# Patient Record
Sex: Female | Born: 1980 | Race: White | Hispanic: No | Marital: Married | Smoking: Former smoker
Health system: Southern US, Community
[De-identification: ages and names within clinical notes are randomized; demographics above are authoritative.]

## PROBLEM LIST (undated history)

## (undated) ENCOUNTER — Inpatient Hospital Stay (HOSPITAL_COMMUNITY): Payer: Self-pay

## (undated) DIAGNOSIS — T8339XA Other mechanical complication of intrauterine contraceptive device, initial encounter: Secondary | ICD-10-CM

## (undated) DIAGNOSIS — K219 Gastro-esophageal reflux disease without esophagitis: Secondary | ICD-10-CM

## (undated) DIAGNOSIS — E119 Type 2 diabetes mellitus without complications: Secondary | ICD-10-CM

## (undated) DIAGNOSIS — A599 Trichomoniasis, unspecified: Secondary | ICD-10-CM

## (undated) DIAGNOSIS — Z8619 Personal history of other infectious and parasitic diseases: Secondary | ICD-10-CM

## (undated) DIAGNOSIS — O10919 Unspecified pre-existing hypertension complicating pregnancy, unspecified trimester: Secondary | ICD-10-CM

## (undated) DIAGNOSIS — D6861 Antiphospholipid syndrome: Secondary | ICD-10-CM

## (undated) DIAGNOSIS — E282 Polycystic ovarian syndrome: Secondary | ICD-10-CM

## (undated) DIAGNOSIS — I10 Essential (primary) hypertension: Secondary | ICD-10-CM

## (undated) DIAGNOSIS — E039 Hypothyroidism, unspecified: Secondary | ICD-10-CM

## (undated) DIAGNOSIS — L409 Psoriasis, unspecified: Secondary | ICD-10-CM

## (undated) HISTORY — DX: Unspecified pre-existing hypertension complicating pregnancy, unspecified trimester: O10.919

## (undated) HISTORY — DX: Hypothyroidism, unspecified: E03.9

## (undated) HISTORY — DX: Type 2 diabetes mellitus without complications: E11.9

## (undated) HISTORY — DX: Trichomoniasis, unspecified: A59.9

---
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
## 2013-10-26 DIAGNOSIS — E282 Polycystic ovarian syndrome: Secondary | ICD-10-CM

## 2013-10-26 HISTORY — DX: Polycystic ovarian syndrome: E28.2

## 2014-04-03 ENCOUNTER — Encounter (HOSPITAL_COMMUNITY): Payer: Self-pay | Admitting: *Deleted

## 2014-04-03 NOTE — H&P (Signed)
Kelli Miller is an 33 y.o. female with missed ab at 7 weeks.  Patient elects to proceed with surgical management.  Blood type AB positive.  Pertinent Gynecological History: Menses: pregnant Bleeding: pregnant Contraception: none DES exposure: unknown Blood transfusions: none Sexually transmitted diseases: no past history Previous GYN Procedures: none  Last mammogram: n/a Date: n/a Last pap: normal Date: 2014 OB History: G3, P0   Menstrual History: Menarche age: n/a No LMP recorded. Patient is pregnant.    Past Medical History  Diagnosis Date  . Hypertension     no meds- ? white coat syndrome    Past Surgical History  Procedure Laterality Date  . No past surgeries      History reviewed. No pertinent family history.  Social History:  reports that she quit smoking about 5 months ago. She does not have any smokeless tobacco history on file. She reports that she does not drink alcohol or use illicit drugs.  Allergies: Allergies not on file  No prescriptions prior to admission    Review of Systems  Constitutional: Negative for fever and chills.  Cardiovascular: Negative for chest pain.  Gastrointestinal: Negative for nausea, vomiting and abdominal pain.  Genitourinary: Negative for dysuria.  Neurological: Negative for headaches.    There were no vitals taken for this visit. Physical Exam  Constitutional: She is oriented to person, place, and time. She appears well-developed and well-nourished.  GI: Soft. There is no tenderness. There is no rebound and no guarding.  Neurological: She is alert and oriented to person, place, and time.  Skin: Skin is warm and dry.  Psychiatric: She has a normal mood and affect. Her behavior is normal.    No results found for this or any previous visit (from the past 24 hour(s)).  No results found.  Assessment/Plan: 32yo G3P0 with missed ab at 7 weeks -Suction d&c possibly with genetics  Mitchel Honour 04/03/2014, 8:18 PM

## 2014-04-04 ENCOUNTER — Encounter (HOSPITAL_COMMUNITY): Payer: Self-pay | Admitting: Pharmacy Technician

## 2014-04-04 MED ORDER — DOXYCYCLINE HYCLATE 100 MG IV SOLR
100.0000 mg | Freq: Once | INTRAVENOUS | Status: AC
  Administered 2014-04-05: 250 mg via INTRAVENOUS
  Filled 2014-04-04: qty 100

## 2014-04-05 ENCOUNTER — Ambulatory Visit (HOSPITAL_COMMUNITY): Payer: PRIVATE HEALTH INSURANCE

## 2014-04-05 ENCOUNTER — Encounter (HOSPITAL_COMMUNITY): Payer: PRIVATE HEALTH INSURANCE | Admitting: Anesthesiology

## 2014-04-05 ENCOUNTER — Ambulatory Visit (HOSPITAL_COMMUNITY)
Admission: RE | Admit: 2014-04-05 | Discharge: 2014-04-05 | Disposition: A | Discharge status: Home / Self Care | Payer: PRIVATE HEALTH INSURANCE | Source: Ambulatory Visit | Attending: Obstetrics & Gynecology | Admitting: Obstetrics & Gynecology

## 2014-04-05 ENCOUNTER — Ambulatory Visit (HOSPITAL_COMMUNITY): Payer: PRIVATE HEALTH INSURANCE | Admitting: Anesthesiology

## 2014-04-05 ENCOUNTER — Encounter (HOSPITAL_COMMUNITY)
Admission: RE | Disposition: A | Discharge status: Home / Self Care | Payer: Self-pay | Source: Ambulatory Visit | Attending: Obstetrics & Gynecology

## 2014-04-05 ENCOUNTER — Encounter (HOSPITAL_COMMUNITY): Payer: Self-pay | Admitting: Anesthesiology

## 2014-04-05 DIAGNOSIS — Z6841 Body Mass Index (BMI) 40.0 and over, adult: Secondary | ICD-10-CM | POA: Insufficient documentation

## 2014-04-05 DIAGNOSIS — E282 Polycystic ovarian syndrome: Secondary | ICD-10-CM | POA: Insufficient documentation

## 2014-04-05 DIAGNOSIS — I10 Essential (primary) hypertension: Secondary | ICD-10-CM | POA: Insufficient documentation

## 2014-04-05 DIAGNOSIS — Z87891 Personal history of nicotine dependence: Secondary | ICD-10-CM | POA: Insufficient documentation

## 2014-04-05 DIAGNOSIS — O021 Missed abortion: Secondary | ICD-10-CM | POA: Insufficient documentation

## 2014-04-05 HISTORY — DX: Essential (primary) hypertension: I10

## 2014-04-05 HISTORY — PX: DILATION AND EVACUATION: SHX1459

## 2014-04-05 LAB — CBC
HEMATOCRIT: 39.8 % (ref 36.0–46.0)
HEMOGLOBIN: 13.6 g/dL (ref 12.0–15.0)
MCH: 29.8 pg (ref 26.0–34.0)
MCHC: 34.2 g/dL (ref 30.0–36.0)
MCV: 87.1 fL (ref 78.0–100.0)
Platelets: 290 10*3/uL (ref 150–400)
RBC: 4.57 MIL/uL (ref 3.87–5.11)
RDW: 15.1 % (ref 11.5–15.5)
WBC: 10.6 10*3/uL — ABNORMAL HIGH (ref 4.0–10.5)

## 2014-04-05 IMAGING — US US OB TRANSVAGINAL
1 series · 14 of 27 positions shown · non-contrast
Comparison: None.

CLINICAL DATA: Confirm intrauterine fetal demise prior to D and C.

EXAM:
OBSTETRIC <14 WK US AND TRANSVAGINAL OB US
TECHNIQUE: Both transabdominal and transvaginal ultrasound examinations were
performed for complete evaluation of the gestation as well as the
maternal uterus, adnexal regions, and pelvic cul-de-sac.
Transvaginal technique was performed to assess early pregnancy.

[Series 1: us ob comp less 14 wks · 27 acquisitions, 14 frames shown]
[im 1/27]
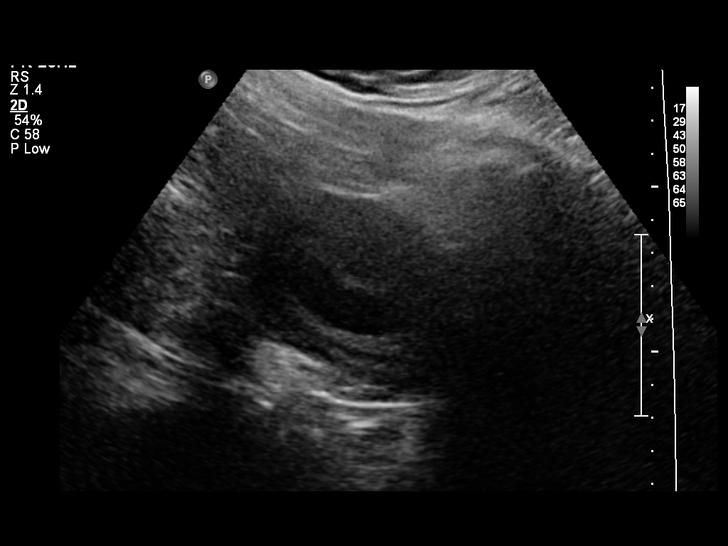
[im 3/27]
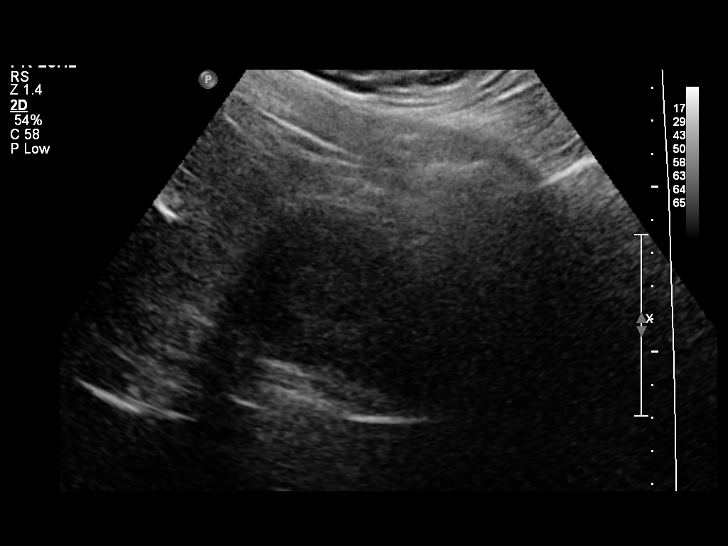
[im 5/27]
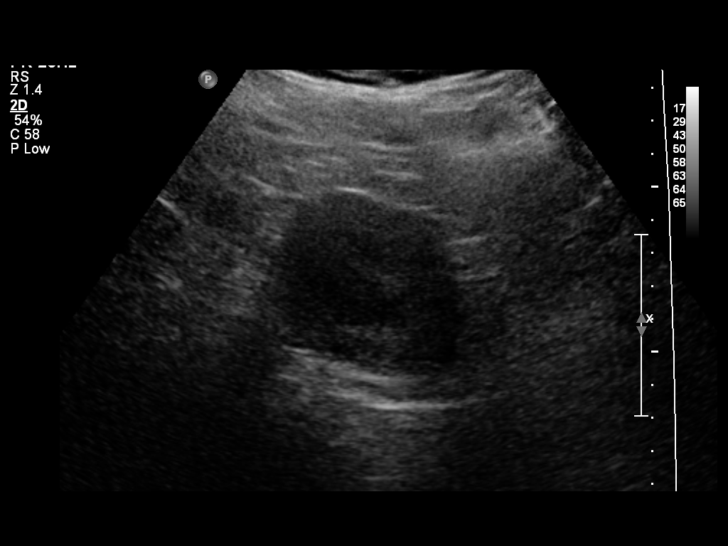
[im 7/27]
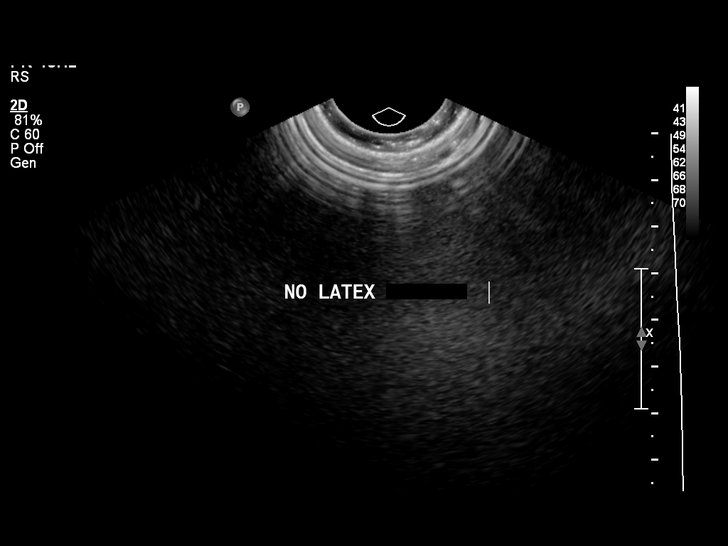
[im 9/27]
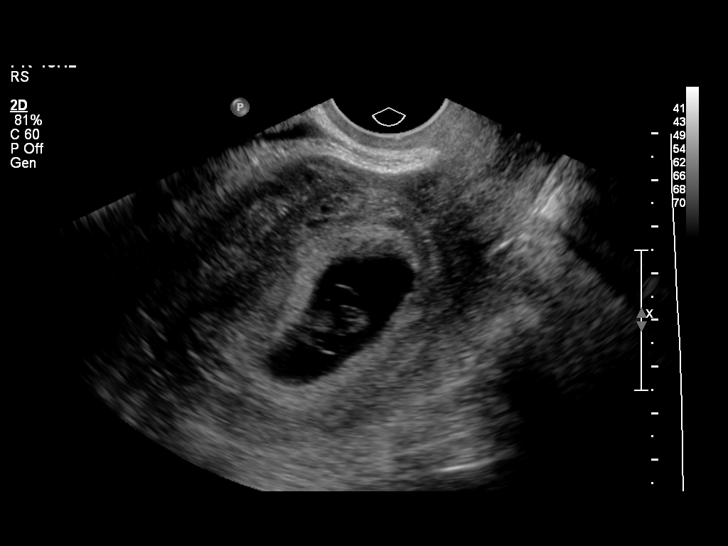
[im 11/27]
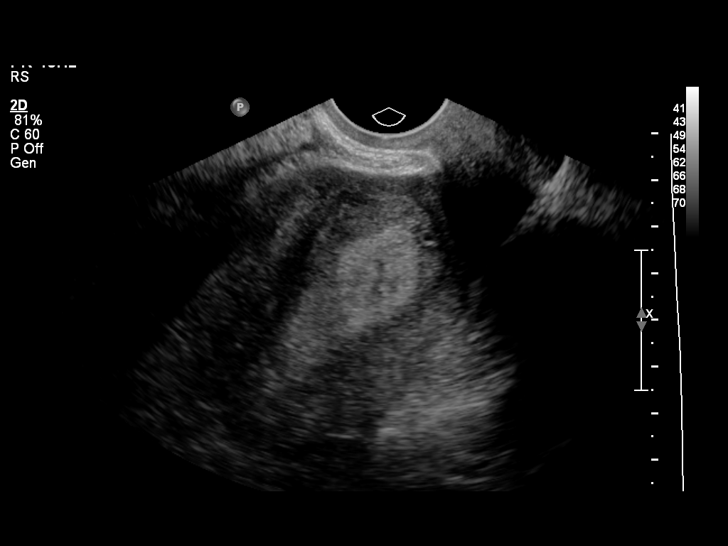
[im 13/27]
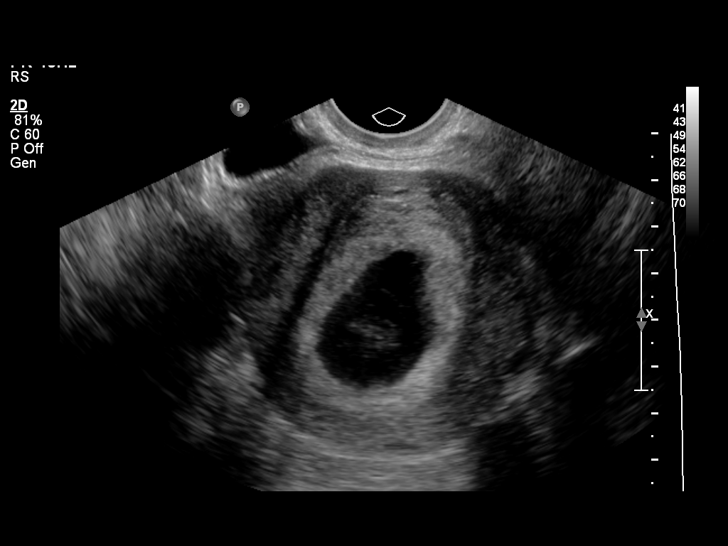
[im 15/27]
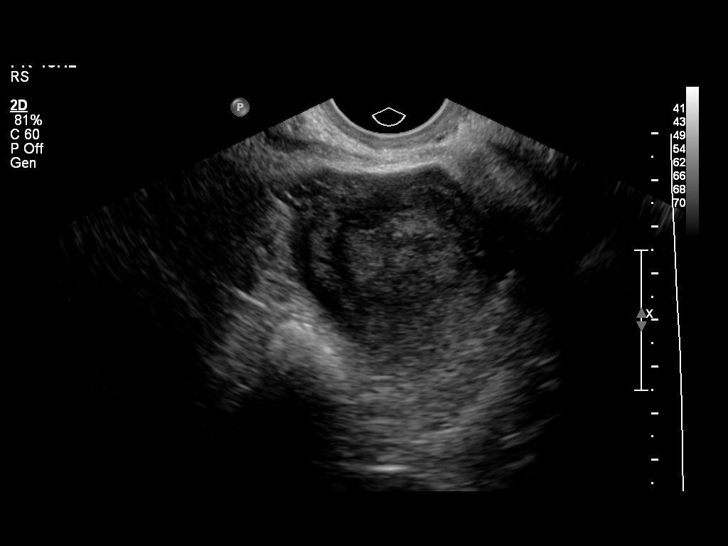
[im 17/27]
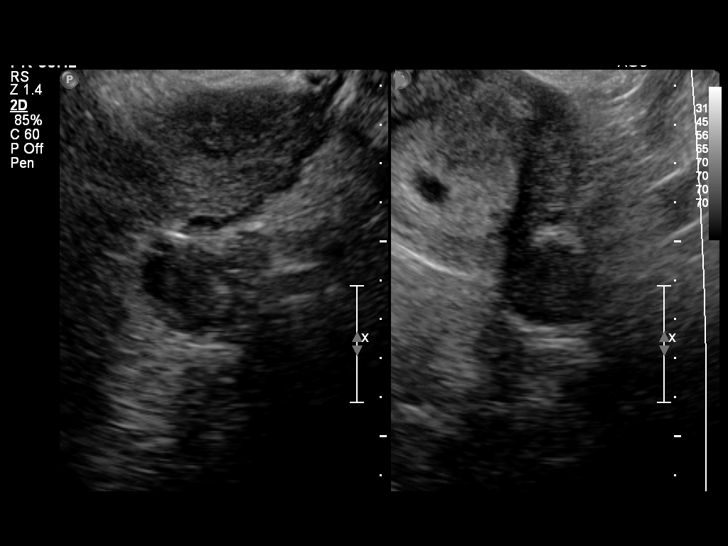
[im 19/27]
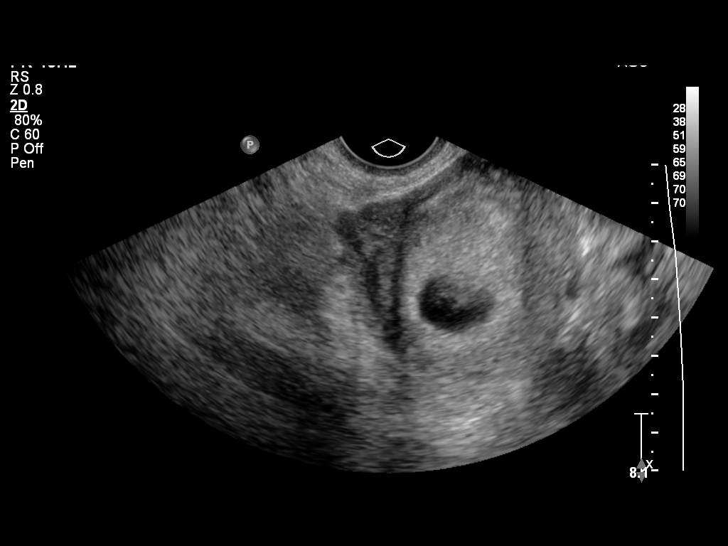
[im 21/27]
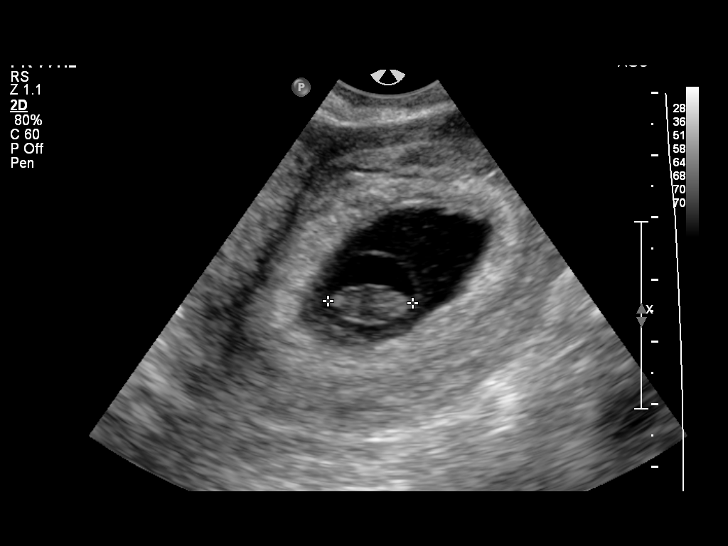
[im 23/27]
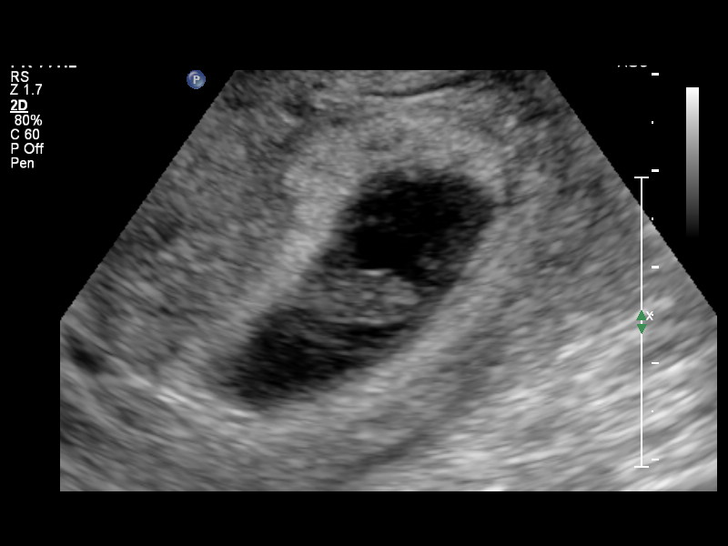
[im 25/27]
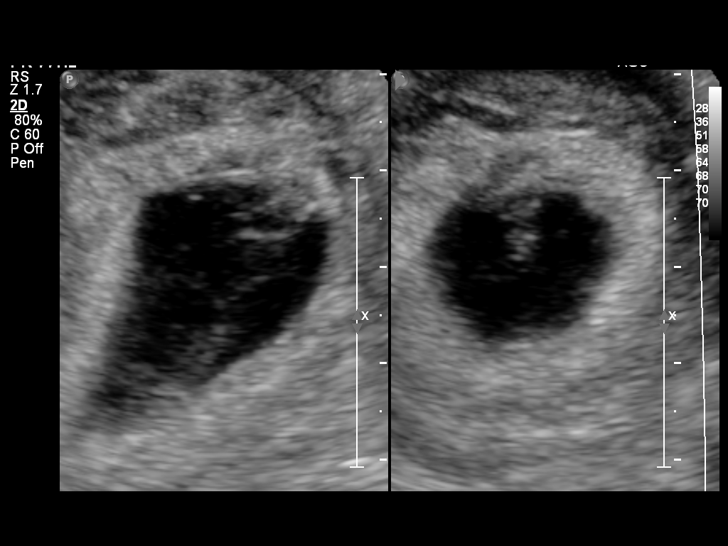
[im 27/27]
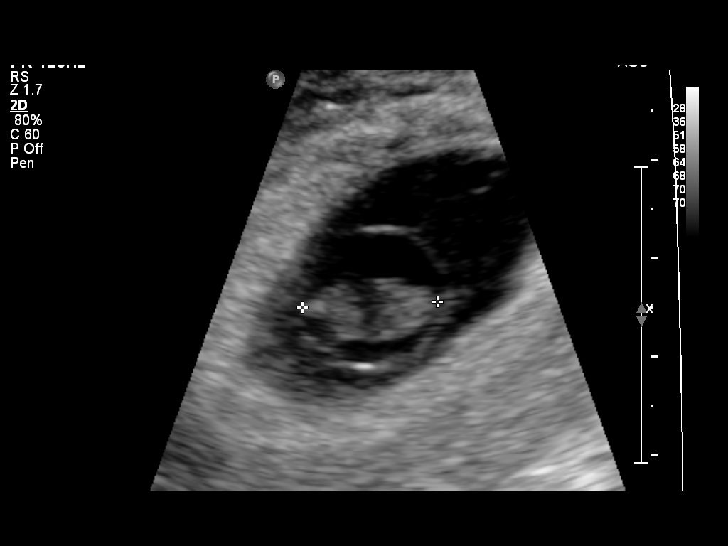

[14 of 27 positions shown; findings below may reference images not displayed]

FINDINGS: Intrauterine gestational sac: Visualized/normal in shape.

Yolk sac:  Yes

Embryo:  Visualize, somewhat an amorphous in appearance.

Cardiac Activity: No Cardiac activity detected

Heart Rate:  No fetal heart rate detectable

CRL:   13.7  mm   7 w 5 d                  US EDC: [DATE]

Maternal uterus/adnexae: No uterine masses. Normal left ovary. Right
ovary not visualized. No adnexal masses. No free fluid. Cervix is
closed.
IMPRESSION: 1. Sonographic findings confirmed intrauterine fetal demise. Embryo
has a crown-rump length consistent with 7 week 5 day gestation.
Findings meet definitive criteria for failed pregnancy. This follows
SRU consensus guidelines: Diagnostic Criteria for Nonviable
Pregnancy Early in the First Trimester. N Engl J Med

## 2014-04-05 SURGERY — DILATION AND EVACUATION, UTERUS
Anesthesia: Monitor Anesthesia Care | Site: Uterus

## 2014-04-05 MED ORDER — MIDAZOLAM HCL 2 MG/2ML IJ SOLN
INTRAMUSCULAR | Status: AC
  Filled 2014-04-05: qty 2

## 2014-04-05 MED ORDER — METOCLOPRAMIDE HCL 5 MG/ML IJ SOLN
10.0000 mg | Freq: Once | INTRAMUSCULAR | Status: DC | PRN
Start: 2014-04-05 — End: 2014-04-05

## 2014-04-05 MED ORDER — OXYCODONE-ACETAMINOPHEN 7.5-325 MG PO TABS: 1.0000 | ORAL_TABLET | ORAL | Status: DC | PRN

## 2014-04-05 MED ORDER — MIDAZOLAM HCL 2 MG/2ML IJ SOLN
INTRAMUSCULAR | Status: DC | PRN
  Administered 2014-04-05: 2 mg via INTRAVENOUS

## 2014-04-05 MED ORDER — LACTATED RINGERS IV SOLN
INTRAVENOUS | Status: DC
  Administered 2014-04-05: 13:00:00 via INTRAVENOUS

## 2014-04-05 MED ORDER — PANTOPRAZOLE SODIUM 20 MG PO TBEC
20.0000 mg | DELAYED_RELEASE_TABLET | Freq: Every day | ORAL | Status: DC
  Administered 2014-04-05: 40 mg via ORAL

## 2014-04-05 MED ORDER — IBUPROFEN 200 MG PO TABS: 200.0000 mg | ORAL_TABLET | Freq: Four times a day (QID) | ORAL | Status: DC | PRN

## 2014-04-05 MED ORDER — PANTOPRAZOLE SODIUM 40 MG PO TBEC
DELAYED_RELEASE_TABLET | ORAL | Status: AC
  Administered 2014-04-05: 40 mg via ORAL
  Filled 2014-04-05: qty 1

## 2014-04-05 MED ORDER — MEPERIDINE HCL 25 MG/ML IJ SOLN: 6.2500 mg | INTRAMUSCULAR | Status: DC | PRN

## 2014-04-05 MED ORDER — DEXTROSE IN LACTATED RINGERS 5 % IV SOLN: INTRAVENOUS | Status: DC

## 2014-04-05 MED ORDER — FENTANYL CITRATE 0.05 MG/ML IJ SOLN
25.0000 ug | INTRAMUSCULAR | Status: DC | PRN
  Administered 2014-04-05: 50 ug via INTRAVENOUS

## 2014-04-05 MED ORDER — CHLOROPROCAINE HCL 1 % IJ SOLN
INTRAMUSCULAR | Status: AC
  Filled 2014-04-05: qty 30

## 2014-04-05 MED ORDER — FENTANYL CITRATE 0.05 MG/ML IJ SOLN
INTRAMUSCULAR | Status: AC
  Filled 2014-04-05: qty 2

## 2014-04-05 MED ORDER — DEXAMETHASONE SODIUM PHOSPHATE 10 MG/ML IJ SOLN
INTRAMUSCULAR | Status: AC
  Filled 2014-04-05: qty 1

## 2014-04-05 MED ORDER — LIDOCAINE HCL (CARDIAC) 20 MG/ML IV SOLN
INTRAVENOUS | Status: DC | PRN
  Administered 2014-04-05: 40 mg via INTRAVENOUS

## 2014-04-05 MED ORDER — LIDOCAINE HCL (CARDIAC) 20 MG/ML IV SOLN
INTRAVENOUS | Status: AC
  Filled 2014-04-05: qty 5

## 2014-04-05 MED ORDER — KETOROLAC TROMETHAMINE 30 MG/ML IJ SOLN
INTRAMUSCULAR | Status: DC | PRN
  Administered 2014-04-05: 30 mg via INTRAVENOUS

## 2014-04-05 MED ORDER — PROPOFOL 10 MG/ML IV EMUL
INTRAVENOUS | Status: DC | PRN
  Administered 2014-04-05: 10 mg via INTRAVENOUS
  Administered 2014-04-05: 50 mg via INTRAVENOUS
  Administered 2014-04-05 (×2): 10 mg via INTRAVENOUS

## 2014-04-05 MED ORDER — ONDANSETRON HCL 4 MG/2ML IJ SOLN
INTRAMUSCULAR | Status: DC | PRN
  Administered 2014-04-05: 4 mg via INTRAVENOUS

## 2014-04-05 MED ORDER — FENTANYL CITRATE 0.05 MG/ML IJ SOLN
INTRAMUSCULAR | Status: DC | PRN
  Administered 2014-04-05 (×2): 100 ug via INTRAVENOUS

## 2014-04-05 MED ORDER — CHLOROPROCAINE HCL 1 % IJ SOLN
INTRAMUSCULAR | Status: DC | PRN
  Administered 2014-04-05: 8 mL

## 2014-04-05 MED ORDER — KETOROLAC TROMETHAMINE 30 MG/ML IJ SOLN
INTRAMUSCULAR | Status: AC
  Filled 2014-04-05: qty 1

## 2014-04-05 MED ORDER — PROPOFOL 10 MG/ML IV EMUL
INTRAVENOUS | Status: AC
  Filled 2014-04-05: qty 20

## 2014-04-05 MED ORDER — ONDANSETRON HCL 4 MG/2ML IJ SOLN
INTRAMUSCULAR | Status: AC
  Filled 2014-04-05: qty 2

## 2014-04-05 SURGICAL SUPPLY — 20 items
CATH ROBINSON RED A/P 16FR (CATHETERS) ×2 IMPLANT
CLOTH BEACON ORANGE TIMEOUT ST (SAFETY) ×2 IMPLANT
DECANTER SPIKE VIAL GLASS SM (MISCELLANEOUS) ×2 IMPLANT
GLOVE BIO SURGEON STRL SZ 6 (GLOVE) ×2 IMPLANT
GLOVE BIOGEL PI IND STRL 6 (GLOVE) ×2 IMPLANT
GLOVE BIOGEL PI INDICATOR 6 (GLOVE) ×2
GOWN STRL REUS W/TWL LRG LVL3 (GOWN DISPOSABLE) ×4 IMPLANT
KIT BERKELEY 1ST TRIMESTER 3/8 (MISCELLANEOUS) ×2 IMPLANT
NEEDLE SPNL 22GX3.5 QUINCKE BK (NEEDLE) ×2 IMPLANT
NS IRRIG 1000ML POUR BTL (IV SOLUTION) ×2 IMPLANT
PACK VAGINAL MINOR WOMEN LF (CUSTOM PROCEDURE TRAY) ×2 IMPLANT
PAD OB MATERNITY 4.3X12.25 (PERSONAL CARE ITEMS) ×2 IMPLANT
PAD PREP 24X48 CUFFED NSTRL (MISCELLANEOUS) ×2 IMPLANT
SET BERKELEY SUCTION TUBING (SUCTIONS) ×2 IMPLANT
SYR CONTROL 10ML LL (SYRINGE) ×2 IMPLANT
TOWEL OR 17X24 6PK STRL BLUE (TOWEL DISPOSABLE) ×4 IMPLANT
VACURETTE 10 RIGID CVD (CANNULA) IMPLANT
VACURETTE 7MM CVD STRL WRAP (CANNULA) IMPLANT
VACURETTE 8 RIGID CVD (CANNULA) ×2 IMPLANT
VACURETTE 9 RIGID CVD (CANNULA) IMPLANT

## 2014-04-05 NOTE — Anesthesia Postprocedure Evaluation (Signed)
  Anesthesia Post-op Note  Patient: Kelli Miller  Procedure(s) Performed: Procedure(s): DILATATION AND EVACUATION with genetic studies (N/A) Patient is awake and responsive. Pain and nausea are reasonably well controlled. Vital signs are stable and clinically acceptable. Oxygen saturation is clinically acceptable. There are no apparent anesthetic complications at this time. Patient is ready for discharge.

## 2014-04-05 NOTE — Transfer of Care (Signed)
Immediate Anesthesia Transfer of Care Note  Patient: Kelli Miller  Procedure(s) Performed: Procedure(s): DILATATION AND EVACUATION with genetic studies (N/A)  Patient Location: PACU  Anesthesia Type:MAC  Level of Consciousness: awake, alert , oriented and patient cooperative  Airway & Oxygen Therapy: Patient Spontanous Breathing  Post-op Assessment: Report given to PACU RN and Post -op Vital signs reviewed and stable  Post vital signs: Reviewed and stable  Complications: No apparent anesthesia complications

## 2014-04-05 NOTE — Anesthesia Preprocedure Evaluation (Addendum)
Anesthesia Evaluation  Patient identified by MRN, date of birth, ID band Patient awake    Reviewed: Allergy & Precautions, H&P , NPO status , Patient's Chart, lab work & pertinent test results  Airway Mallampati: III TM Distance: >3 FB Neck ROM: Full    Dental no notable dental hx. (+) Teeth Intact   Pulmonary former smoker,  breath sounds clear to auscultation  Pulmonary exam normal       Cardiovascular hypertension, Rhythm:Regular Rate:Normal     Neuro/Psych negative neurological ROS  negative psych ROS   GI/Hepatic negative GI ROS, Neg liver ROS,   Endo/Other  Morbid obesityObesity PCOS  Renal/GU   negative genitourinary   Musculoskeletal negative musculoskeletal ROS (+)   Abdominal   Peds  Hematology negative hematology ROS (+)   Anesthesia Other Findings   Reproductive/Obstetrics Missed Ab                         Anesthesia Physical Anesthesia Plan  ASA: III  Anesthesia Plan: MAC   Post-op Pain Management:    Induction: Intravenous  Airway Management Planned: Natural Airway and Nasal Cannula  Additional Equipment:   Intra-op Plan:   Post-operative Plan:   Informed Consent: I have reviewed the patients History and Physical, chart, labs and discussed the procedure including the risks, benefits and alternatives for the proposed anesthesia with the patient or authorized representative who has indicated his/her understanding and acceptance.     Plan Discussed with: Anesthesiologist, CRNA and Surgeon  Anesthesia Plan Comments:        Anesthesia Quick Evaluation

## 2014-04-05 NOTE — Discharge Instructions (Signed)
NO IBUPROFEN (ADVIL, ALEVE OR MOTRIN) TILL AFTER 7:45 PM!    Dilation and Curettage or Vacuum Curettage, Care After Refer to this sheet in the next few weeks. These instructions provide you with information on caring for yourself after your procedure. Your health care provider may also give you more specific instructions. Your treatment has been planned according to current medical practices, but problems sometimes occur. Call your health care provider if you have any problems or questions after your procedure.  WHAT TO EXPECT AFTER THE PROCEDURE After your procedure, it is typical to have light cramping and bleeding. This may last for 2 days to 2 weeks after the procedure.  HOME CARE INSTRUCTIONS   Call MD for T>100.4, severe abdominal pain, intractable nausea and/or vomiting, or heavy vaginal bleeding.    Call office to schedule postop appt in 2 weeks.    No driving while taking narcotics.    Pelvic rest x 4 weeks.    Do not drive for 24 hours.  Wait 1 week before returning to strenuous activities.  Take your temperature 2 times a day for 4 days and write it down. Provide these temperatures to your health care provider if you develop a fever.  Avoid long periods of standing.  Avoid heavy lifting, pushing, or pulling. Do not lift anything heavier than 10 pounds (4.5 kg).  Limit stair climbing to once or twice a day.  Take rest periods often.  You may resume your usual diet.  Drink enough fluids to keep your urine clear or pale yellow.  Your usual bowel function should return. If you have constipation, you may:  Take a mild laxative with permission from your health care provider.  Add fruit and bran to your diet.  Drink more fluids.  Take showers instead of baths until your health care provider gives you permission to take baths.  Do not go swimming or use a hot tub until your health care provider approves.  Try to have someone with you or available to you the first  24 48 hours, especially if you were given a general anesthetic.  Do not douche, use tampons, or have intercourse for 2 weeks after the procedure.  Only take over-the-counter or prescription medicines as directed by your health care provider. Do not take aspirin. It can cause bleeding.  Follow up with your health care provider as directed.  SEEK MEDICAL CARE IF:   You have increasing cramps or pain that is not relieved with medicine.  You have abdominal pain that does not seem to be related to the same area of earlier cramping and pain.  You have bad smelling vaginal discharge.  You have a rash.  You are having problems with any medicine. SEEK IMMEDIATE MEDICAL CARE IF:   You have bleeding that is heavier than a normal menstrual period.  You have a fever.  You have chest pain.  You have shortness of breath.  You feel dizzy or feel like fainting.  You pass out.  You have pain in your shoulder strap area.  You have heavy vaginal bleeding with or without blood clots.  Document Released: 10/09/2000 Document Revised: 08/02/2013 Document Reviewed: 05/11/2013

## 2014-04-05 NOTE — Progress Notes (Signed)
No change to H&P. 

## 2014-04-06 ENCOUNTER — Encounter (HOSPITAL_COMMUNITY): Payer: Self-pay | Admitting: Obstetrics & Gynecology

## 2014-04-06 NOTE — Op Note (Signed)
Patient: Kelli Miller, Kelli Miller DOB: 07/01/1981  PREOPERATIVE DIAGNOSIS: Missed abortion at 7 weeks  POSTOPERATIVE DIAGNOSIS: The same  PROCEDURE: Suction Dilation and Curettage with genetic studies.  SURGEON: Dr. Mitchel HonourMegan Darilyn Storbeck  INDICATIONS: 33 y.o. G3P0 here for scheduled surgery for suction D&C for missed abortion at 7 weeks. Risks of surgery were discussed with the patient including but not limited to: bleeding which may require transfusion; infection which may require antibiotics; injury to uterus or surrounding organs; intrauterine scarring which may impair future fertility; need for additional procedures including laparotomy or laparoscopy; and other postoperative/anesthesia complications. Written informed consent was obtained.  FINDINGS: A 8 week size uterus.  ANESTHESIA: conscious sedation and local  ESTIMATED BLOOD LOSS: 20cc  SPECIMENS: Products of conception sent for genetic testing and pathology  COMPLICATIONS: None immediate.  PROCEDURE DETAILS: The patient received intravenous antibiotics while in the preoperative area. She was then taken to the operating room where conscious sedation was administered. After an adequate timeout was performed, she was placed in the dorsal lithotomy position and examined; then prepped and draped in the sterile manner. Her bladder was catheterized for an unmeasured amount of clear, yellow urine. A speculum was then placed in the patient's vagina and cervical block was performed using 10cc of 1% nesacaine. a single tooth tenaculum was applied to the anterior lip of the cervix. The uteus was dilated manually with Pratt cervical dilators up to 24 [place]. Once the cervix was dilated, 8 mm suction curette was advanced and suction attached. Three suction curettage passes were performed which did remove tissue. A sharp curettage was then performed to obtain a scant amount of endometrial curettings and revealed a gritty texture circumferentially. The tenaculum was removed from  the anterior lip of the cervix and the vaginal speculum was removed after noting good hemostasis. The patient tolerated the procedure well and was taken to the recovery area awake, extubated and in stable condition.

## 2014-04-26 ENCOUNTER — Ambulatory Visit (INDEPENDENT_AMBULATORY_CARE_PROVIDER_SITE_OTHER): Payer: PRIVATE HEALTH INSURANCE | Admitting: General Surgery

## 2014-04-30 LAB — CHROMOSOME STD, POC(TISSUE)-NCBH

## 2014-08-27 ENCOUNTER — Encounter (HOSPITAL_COMMUNITY): Payer: Self-pay | Admitting: Obstetrics & Gynecology

## 2016-01-16 DIAGNOSIS — N915 Oligomenorrhea, unspecified: Secondary | ICD-10-CM | POA: Insufficient documentation

## 2016-01-16 DIAGNOSIS — N96 Recurrent pregnancy loss: Secondary | ICD-10-CM | POA: Insufficient documentation

## 2016-01-16 DIAGNOSIS — R7989 Other specified abnormal findings of blood chemistry: Secondary | ICD-10-CM | POA: Insufficient documentation

## 2017-09-23 ENCOUNTER — Encounter: Payer: Self-pay | Admitting: Obstetrics and Gynecology

## 2017-09-23 ENCOUNTER — Ambulatory Visit (INDEPENDENT_AMBULATORY_CARE_PROVIDER_SITE_OTHER): Payer: PRIVATE HEALTH INSURANCE | Admitting: Obstetrics and Gynecology

## 2017-09-23 VITALS — BP 133/87 | HR 84 | Ht 61.0 in | Wt 212.0 lb

## 2017-09-23 DIAGNOSIS — Z124 Encounter for screening for malignant neoplasm of cervix: Secondary | ICD-10-CM

## 2017-09-23 DIAGNOSIS — Z01419 Encounter for gynecological examination (general) (routine) without abnormal findings: Secondary | ICD-10-CM

## 2017-09-23 DIAGNOSIS — Z1151 Encounter for screening for human papillomavirus (HPV): Secondary | ICD-10-CM

## 2017-09-23 MED ORDER — PROGESTERONE MICRONIZED 200 MG PO CAPS
ORAL_CAPSULE | ORAL | 3 refills | Status: DC
Start: 2017-09-23 — End: 2018-03-01

## 2017-09-23 NOTE — Progress Notes (Signed)
Subjective:     [name] is a 36 y.o. female P0 with BMI 40 whois here for a comprehensive physical exam. The patient reports no problems. She is sexually active seeking pregnancy. She desires to be seen by an infertility specialist for assistance. She reports monthly menses induced by progesterone. She denies pelvic pain or abnormal discharge.  Past Medical History:  Diagnosis Date  . Diabetes (HCC)   . Hypothyroidism    Past Surgical History:  Procedure Laterality Date  . DILATION AND EVACUATION N/A 04/05/2014   Procedure: DILATATION AND EVACUATION with genetic studies;  Surgeon: Mitchel HonourMegan Morris, DO;  Location: [facility];  Service: Gynecology;  Laterality: N/A;  . NO PAST SURGERIES     Family History  Problem Relation Age of Onset  . Lung cancer Father   . Lung cancer Mother     Social History   Socioeconomic History  . Marital status: Single    Spouse name: Not on file  . Number of children: Not on file  . Years of education: Not on file  . Highest education level: Not on file  Social Needs  . Financial resource strain: Not on file  . Food insecurity - worry: Not on file  . Food insecurity - inability: Not on file  . Transportation needs - medical: Not on file  . Transportation needs - non-medical: Not on file  Occupational History  . Not on file  Tobacco Use  . Smoking status: Former Smoker    Last attempt to quit: 10/03/2013    Years since quitting: 3.9  . Smokeless tobacco: Never Used  Substance and Sexual Activity  . Alcohol use: No  . Drug use: No  . Sexual activity: Yes    Birth control/protection: None  Other Topics Concern  . Not on file  Social History Narrative  . Not on file   Health Maintenance  Topic Date Due  . HIV Screening  04/30/1994  . TETANUS/TDAP  04/30/1998  . PAP SMEAR  04/30/2000  . INFLUENZA VACCINE  05/26/2017       Review of Systems Pertinent items are noted in HPI.   Objective:  Blood pressure 133/87, pulse 84, height 5\' 1"   (1.549 m), weight 212 lb (96.2 kg), last menstrual period 08/23/2017, unknown if currently breastfeeding.      GENERAL: Well-developed, well-nourished female in no acute distress.  HEENT: Normocephalic, atraumatic. Sclerae anicteric.  NECK: Supple. Normal thyroid.  LUNGS: Clear to auscultation bilaterally.  HEART: Regular rate and rhythm. BREASTS: Symmetric in size. No palpable masses or lymphadenopathy, skin changes, or nipple drainage. ABDOMEN: Soft, nontender, nondistended. No organomegaly. PELVIC: Normal external female genitalia. Vagina is pink and rugated.  Normal discharge. Normal appearing cervix. Uterus is normal in size. No adnexal mass or tenderness. EXTREMITIES: No cyanosis, clubbing, or edema, 2+ distal pulses.  Assessment:    Healthy female exam.      Plan:    Pap smear collected Patient advised to perform monthly self breast and vulva exam Patient will be contacted with any abnormal results Referral to meet Dr. April MansonYalcinkaya provided Patient reports normal A1C a few months ago RTC in 1 year or prn See After Visit Summary for Counseling Recommendations

## 2017-09-27 ENCOUNTER — Encounter: Payer: Self-pay | Admitting: Obstetrics and Gynecology

## 2017-09-28 LAB — CYTOLOGY - PAP
Bacterial vaginitis: NEGATIVE
CANDIDA VAGINITIS: NEGATIVE
DIAGNOSIS: NEGATIVE
HPV: NOT DETECTED

## 2017-10-26 DIAGNOSIS — Z8619 Personal history of other infectious and parasitic diseases: Secondary | ICD-10-CM

## 2017-10-26 DIAGNOSIS — D6861 Antiphospholipid syndrome: Secondary | ICD-10-CM

## 2017-10-26 HISTORY — DX: Antiphospholipid syndrome: D68.61

## 2017-10-26 HISTORY — DX: Personal history of other infectious and parasitic diseases: Z86.19

## 2017-12-16 ENCOUNTER — Ambulatory Visit (INDEPENDENT_AMBULATORY_CARE_PROVIDER_SITE_OTHER): Payer: PRIVATE HEALTH INSURANCE | Admitting: Obstetrics and Gynecology

## 2017-12-16 ENCOUNTER — Encounter: Payer: Self-pay | Admitting: Obstetrics and Gynecology

## 2017-12-16 VITALS — BP 152/103 | HR 101 | Wt 212.0 lb

## 2017-12-16 DIAGNOSIS — O24111 Pre-existing diabetes mellitus, type 2, in pregnancy, first trimester: Secondary | ICD-10-CM

## 2017-12-16 DIAGNOSIS — O0991 Supervision of high risk pregnancy, unspecified, first trimester: Secondary | ICD-10-CM

## 2017-12-16 DIAGNOSIS — O24119 Pre-existing diabetes mellitus, type 2, in pregnancy, unspecified trimester: Secondary | ICD-10-CM

## 2017-12-16 DIAGNOSIS — O99281 Endocrine, nutritional and metabolic diseases complicating pregnancy, first trimester: Secondary | ICD-10-CM

## 2017-12-16 DIAGNOSIS — O0990 Supervision of high risk pregnancy, unspecified, unspecified trimester: Secondary | ICD-10-CM | POA: Insufficient documentation

## 2017-12-16 DIAGNOSIS — O24112 Pre-existing diabetes mellitus, type 2, in pregnancy, second trimester: Secondary | ICD-10-CM | POA: Insufficient documentation

## 2017-12-16 DIAGNOSIS — O99280 Endocrine, nutritional and metabolic diseases complicating pregnancy, unspecified trimester: Secondary | ICD-10-CM

## 2017-12-16 DIAGNOSIS — Z3480 Encounter for supervision of other normal pregnancy, unspecified trimester: Secondary | ICD-10-CM

## 2017-12-16 DIAGNOSIS — E039 Hypothyroidism, unspecified: Secondary | ICD-10-CM | POA: Insufficient documentation

## 2017-12-16 DIAGNOSIS — Z113 Encounter for screening for infections with a predominantly sexual mode of transmission: Secondary | ICD-10-CM

## 2017-12-16 NOTE — Addendum Note (Signed)
Addended by: Kathie DikeSOLA, Shley Dolby J on: 12/16/2017 04:13 PM   Modules accepted: Orders

## 2017-12-16 NOTE — Progress Notes (Signed)
First BP 152/103. Repeat BP 149/93.

## 2017-12-16 NOTE — Progress Notes (Signed)
Subjective:    Kelli Miller is a G4P0030 6443w3d being seen today for her first obstetrical visit.  Her obstetrical history is significant for advanced maternal age, obesity and type 2 DM and hypothyroidism. She also tested positive for anti-phospholipid syndrome and is on lovenox. Patient does intend to breast feed. Pregnancy history fully reviewed. Patient is currently taking glyburide and metformin for her diabetes. She reports CBGs fasting 90-100 and pp 120-160.   Patient reports some nausea.  Vitals:   12/16/17 1444 12/16/17 1454  BP: (!) 149/93 (!) 152/103  Pulse: (!) 101   Weight: 212 lb (96.2 kg)     HISTORY: OB History  Gravida Para Term Preterm AB Living  4 0     3    SAB TAB Ectopic Multiple Live Births  3            # Outcome Date GA Lbr Len/2nd Weight Sex Delivery Anes PTL Lv  4 Current           3 SAB           2 SAB           1 SAB              Past Medical History:  Diagnosis Date  . Diabetes (HCC)   . Hypothyroidism    Past Surgical History:  Procedure Laterality Date  . DILATION AND EVACUATION N/A 04/05/2014   Procedure: DILATATION AND EVACUATION with genetic studies;  Surgeon: Mitchel HonourMegan Morris, DO;  Location: [facility];  Service: Gynecology;  Laterality: N/A;  . NO PAST SURGERIES     Family History  Problem Relation Age of Onset  . Lung cancer Father   . Lung cancer Mother      Exam    Uterus:   10- weeks in size  Pelvic Exam:    Perineum: Normal Perineum   Vulva: normal   Vagina:  normal mucosa, normal discharge   pH:    Cervix: multiparous appearance   Adnexa: normal adnexa and no mass, fullness, tenderness   Bony Pelvis: gynecoid  System: Breast:  normal appearance, no masses or tenderness   Skin: normal coloration and turgor, no rashes    Neurologic: no focal deficits   Extremities: normal strength, tone, and muscle mass   HEENT extra ocular movement intact   Mouth/Teeth mucous membranes moist, pharynx normal without lesions and dental  hygiene good   Neck supple and no masses   Cardiovascular: regular rate and rhythm   Respiratory:  chest clear, no wheezing, crepitations, rhonchi, normal symmetric air entry   Abdomen: soft, non-tender; bowel sounds normal; no masses,  no organomegaly   Urinary:       Assessment:    Pregnancy: G4P0030 Patient Active Problem List   Diagnosis Date Noted  . Supervision of high risk pregnancy, antepartum 12/16/2017  . Type 2 diabetes mellitus affecting pregnancy, antepartum 12/16/2017  . Hypothyroidism affecting pregnancy, antepartum 12/16/2017        Plan:     Initial labs drawn. Prenatal vitamins. Problem list reviewed and updated. Genetic Screening discussed : panorama ordered.  Ultrasound discussed; fetal survey: requested.  Follow up in 2 weeks to see improvement in CBGs following changes in diet Patient referred to diabetic educator Patient referred to genetic counselor due [facility]. Patient without history of HTN and normotensive during her last visit in 08/2017. Repeat BP prior to departure 137/88 Patient reports having a genetic panel done previously during the work-up of  her SAbs and would like to defer CF, Hg and SMN1 carrier screen until review of those results (ROI signed for records) 50% of 30 min visit spent on counseling and coordination of care.     Kelli Miller 12/16/2017

## 2017-12-16 NOTE — Patient Instructions (Addendum)
 First Trimester of Pregnancy The first trimester of pregnancy is from week 1 until the end of week 13 (months 1 through 3). A week after a sperm fertilizes an egg, the egg will implant on the wall of the uterus. This embryo will begin to develop into a baby. Genes from you and your partner will form the baby. The female genes will determine whether the baby will be a boy or a girl. At 6-8 weeks, the eyes and face will be formed, and the heartbeat can be seen on ultrasound. At the end of 12 weeks, all the baby's organs will be formed. Now that you are pregnant, you will want to do everything you can to have a healthy baby. Two of the most important things are to get good prenatal care and to follow your health care provider's instructions. Prenatal care is all the medical care you receive before the baby's birth. This care will help prevent, find, and treat any problems during the pregnancy and childbirth. Body changes during your first trimester Your body goes through many changes during pregnancy. The changes vary from woman to woman.  You may gain or lose a couple of pounds at first.  You may feel sick to your stomach (nauseous) and you may throw up (vomit). If the vomiting is uncontrollable, call your health care provider.  You may tire easily.  You may develop headaches that can be relieved by medicines. All medicines should be approved by your health care provider.  You may urinate more often. Painful urination may mean you have a bladder infection.  You may develop heartburn as a result of your pregnancy.  You may develop constipation because certain hormones are causing the muscles that push stool through your intestines to slow down.  You may develop hemorrhoids or swollen veins (varicose veins).  Your breasts may begin to grow larger and become tender. Your nipples may stick out more, and the tissue that surrounds them (areola) may become darker.  Your gums may bleed and may be  sensitive to brushing and flossing.  Dark spots or blotches (chloasma, mask of pregnancy) may develop on your face. This will likely fade after the baby is born.  Your menstrual periods will stop.  You may have a loss of appetite.  You may develop cravings for certain kinds of food.  You may have changes in your emotions from day to day, such as being excited to be pregnant or being concerned that something may go wrong with the pregnancy and baby.  You may have more vivid and strange dreams.  You may have changes in your hair. These can include thickening of your hair, rapid growth, and changes in texture. Some women also have hair loss during or after pregnancy, or hair that feels dry or thin. Your hair will most likely return to normal after your baby is born.  What to expect at prenatal visits During a routine prenatal visit:  You will be weighed to make sure you and the baby are growing normally.  Your blood pressure will be taken.  Your abdomen will be measured to track your baby's growth.  The fetal heartbeat will be listened to between weeks 10 and 14 of your pregnancy.  Test results from any previous visits will be discussed.  Your health care provider may ask you:  How you are feeling.  If you are feeling the baby move.  If you have had any abnormal symptoms, such as leaking fluid, bleeding, severe   headaches, or abdominal cramping.  If you are using any tobacco products, including cigarettes, chewing tobacco, and electronic cigarettes.  If you have any questions.  Other tests that may be performed during your first trimester include:  Blood tests to find your blood type and to check for the presence of any previous infections. The tests will also be used to check for low iron levels (anemia) and protein on red blood cells (Rh antibodies). Depending on your risk factors, or if you previously had diabetes during pregnancy, you may have tests to check for high blood  sugar that affects pregnant women (gestational diabetes).  Urine tests to check for infections, diabetes, or protein in the urine.  An ultrasound to confirm the proper growth and development of the baby.  Fetal screens for spinal cord problems (spina bifida) and Down syndrome.  HIV (human immunodeficiency virus) testing. Routine prenatal testing includes screening for HIV, unless you choose not to have this test.  You may need other tests to make sure you and the baby are doing well.  Follow these instructions at home: Medicines  Follow your health care provider's instructions regarding medicine use. Specific medicines may be either safe or unsafe to take during pregnancy.  Take a prenatal vitamin that contains at least 600 micrograms (mcg) of folic acid.  If you develop constipation, try taking a stool softener if your health care provider approves. Eating and drinking  Eat a balanced diet that includes fresh fruits and vegetables, whole grains, good sources of protein such as meat, eggs, or tofu, and low-fat dairy. Your health care provider will help you determine the amount of weight gain that is right for you.  Avoid raw meat and uncooked cheese. These carry germs that can cause birth defects in the baby.  Eating four or five small meals rather than three large meals a day may help relieve nausea and vomiting. If you start to feel nauseous, eating a few soda crackers can be helpful. Drinking liquids between meals, instead of during meals, also seems to help ease nausea and vomiting.  Limit foods that are high in fat and processed sugars, such as fried and sweet foods.  To prevent constipation: ? Eat foods that are high in fiber, such as fresh fruits and vegetables, whole grains, and beans. ? Drink enough fluid to keep your urine clear or pale yellow. Activity  Exercise only as directed by your health care provider. Most women can continue their usual exercise routine during  pregnancy. Try to exercise for 30 minutes at least 5 days a week. Exercising will help you: ? Control your weight. ? Stay in shape. ? Be prepared for labor and delivery.  Experiencing pain or cramping in the lower abdomen or lower back is a good sign that you should stop exercising. Check with your health care provider before continuing with normal exercises.  Try to avoid standing for long periods of time. Move your legs often if you must stand in one place for a long time.  Avoid heavy lifting.  Wear low-heeled shoes and practice good posture.  You may continue to have sex unless your health care provider tells you not to. Relieving pain and discomfort  Wear a good support bra to relieve breast tenderness.  Take warm sitz baths to soothe any pain or discomfort caused by hemorrhoids. Use hemorrhoid cream if your health care provider approves.  Rest with your legs elevated if you have leg cramps or low back pain.  If you   develop varicose veins in your legs, wear support hose. Elevate your feet for 15 minutes, 3-4 times a day. Limit salt in your diet. Prenatal care  Schedule your prenatal visits by the twelfth week of pregnancy. They are usually scheduled monthly at first, then more often in the last 2 months before delivery.  Write down your questions. Take them to your prenatal visits.  Keep all your prenatal visits as told by your health care provider. This is important. Safety  Wear your seat belt at all times when driving.  Make a list of emergency phone numbers, including numbers for family, friends, the hospital, and police and fire departments. General instructions  Ask your health care provider for a referral to a local prenatal education class. Begin classes no later than the beginning of month 6 of your pregnancy.  Ask for help if you have counseling or nutritional needs during pregnancy. Your health care provider can offer advice or refer you to specialists for help  with various needs.  Do not use hot tubs, steam rooms, or saunas.  Do not douche or use tampons or scented sanitary pads.  Do not cross your legs for long periods of time.  Avoid cat litter boxes and soil used by cats. These carry germs that can cause birth defects in the baby and possibly loss of the fetus by miscarriage or stillbirth.  Avoid all smoking, herbs, alcohol, and medicines not prescribed by your health care provider. Chemicals in these products affect the formation and growth of the baby.  Do not use any products that contain nicotine or tobacco, such as cigarettes and e-cigarettes. If you need help quitting, ask your health care provider. You may receive counseling support and other resources to help you quit.  Schedule a dentist appointment. At home, brush your teeth with a soft toothbrush and be gentle when you floss. Contact a health care provider if:  You have dizziness.  You have mild pelvic cramps, pelvic pressure, or nagging pain in the abdominal area.  You have persistent nausea, vomiting, or diarrhea.  You have a bad smelling vaginal discharge.  You have pain when you urinate.  You notice increased swelling in your face, hands, legs, or ankles.  You are exposed to fifth disease or chickenpox.  You are exposed to German measles (rubella) and have never had it. Get help right away if:  You have a fever.  You are leaking fluid from your vagina.  You have spotting or bleeding from your vagina.  You have severe abdominal cramping or pain.  You have rapid weight gain or loss.  You vomit blood or material that looks like coffee grounds.  You develop a severe headache.  You have shortness of breath.  You have any kind of trauma, such as from a fall or a car accident. Summary  The first trimester of pregnancy is from week 1 until the end of week 13 (months 1 through 3).  Your body goes through many changes during pregnancy. The changes vary from  woman to woman.  You will have routine prenatal visits. During those visits, your health care provider will examine you, discuss any test results you may have, and talk with you about how you are feeling. This information is not intended to replace advice given to you by your health care provider. Make sure you discuss any questions you have with your health care provider. Document Released: 10/06/2001 Document Revised: 09/23/2016 Document Reviewed: 09/23/2016 [employer] Interactive Patient Education  2018   [employer]   Second Trimester of Pregnancy The second trimester is from week 14 through week 27 (months 4 through 6). The second trimester is often a time when you feel your best. Your body has adjusted to being pregnant, and you begin to feel better physically. Usually, morning sickness has lessened or quit completely, you may have more energy, and you may have an increase in appetite. The second trimester is also a time when the fetus is growing rapidly. At the end of the sixth month, the fetus is about 9 inches long and weighs about 1 pounds. You will likely begin to feel the baby move (quickening) between 16 and 20 weeks of pregnancy. Body changes during your second trimester Your body continues to go through many changes during your second trimester. The changes vary from woman to woman.  Your weight will continue to increase. You will notice your lower abdomen bulging out.  You may begin to get stretch marks on your hips, abdomen, and breasts.  You may develop headaches that can be relieved by medicines. The medicines should be approved by your health care provider.  You may urinate more often because the fetus is pressing on your bladder.  You may develop or continue to have heartburn as a result of your pregnancy.  You may develop constipation because certain hormones are causing the muscles that push waste through your intestines to slow down.  You may develop hemorrhoids or  swollen, bulging veins (varicose veins).  You may have back pain. This is caused by: ? Weight gain. ? Pregnancy hormones that are relaxing the joints in your pelvis. ? A shift in weight and the muscles that support your balance.  Your breasts will continue to grow and they will continue to become tender.  Your gums may bleed and may be sensitive to brushing and flossing.  Dark spots or blotches (chloasma, mask of pregnancy) may develop on your face. This will likely fade after the baby is born.  A dark line from your belly button to the pubic area (linea nigra) may appear. This will likely fade after the baby is born.  You may have changes in your hair. These can include thickening of your hair, rapid growth, and changes in texture. Some women also have hair loss during or after pregnancy, or hair that feels dry or thin. Your hair will most likely return to normal after your baby is born.  What to expect at prenatal visits During a routine prenatal visit:  You will be weighed to make sure you and the fetus are growing normally.  Your blood pressure will be taken.  Your abdomen will be measured to track your baby's growth.  The fetal heartbeat will be listened to.  Any test results from the previous visit will be discussed.  Your health care provider may ask you:  How you are feeling.  If you are feeling the baby move.  If you have had any abnormal symptoms, such as leaking fluid, bleeding, severe headaches, or abdominal cramping.  If you are using any tobacco products, including cigarettes, chewing tobacco, and electronic cigarettes.  If you have any questions.  Other tests that may be performed during your second trimester include:  Blood tests that check for: ? Low iron levels (anemia). ? High blood sugar that affects pregnant women (gestational diabetes) between 24 and 28 weeks. ? Rh antibodies. This is to check for a protein on red blood cells (Rh factor).  Urine  tests to   check for infections, diabetes, or protein in the urine.  An ultrasound to confirm the proper growth and development of the baby.  An amniocentesis to check for possible genetic problems.  Fetal screens for spina bifida and Down syndrome.  HIV (human immunodeficiency virus) testing. Routine prenatal testing includes screening for HIV, unless you choose not to have this test.  Follow these instructions at home: Medicines  Follow your health care provider's instructions regarding medicine use. Specific medicines may be either safe or unsafe to take during pregnancy.  Take a prenatal vitamin that contains at least 600 micrograms (mcg) of folic acid.  If you develop constipation, try taking a stool softener if your health care provider approves. Eating and drinking  Eat a balanced diet that includes fresh fruits and vegetables, whole grains, good sources of protein such as meat, eggs, or tofu, and low-fat dairy. Your health care provider will help you determine the amount of weight gain that is right for you.  Avoid raw meat and uncooked cheese. These carry germs that can cause birth defects in the baby.  If you have low calcium intake from food, talk to your health care provider about whether you should take a daily calcium supplement.  Limit foods that are high in fat and processed sugars, such as fried and sweet foods.  To prevent constipation: ? Drink enough fluid to keep your urine clear or pale yellow. ? Eat foods that are high in fiber, such as fresh fruits and vegetables, whole grains, and beans. Activity  Exercise only as directed by your health care provider. Most women can continue their usual exercise routine during pregnancy. Try to exercise for 30 minutes at least 5 days a week. Stop exercising if you experience uterine contractions.  Avoid heavy lifting, wear low heel shoes, and practice good posture.  A sexual relationship may be continued unless your health  care provider directs you otherwise. Relieving pain and discomfort  Wear a good support bra to prevent discomfort from breast tenderness.  Take warm sitz baths to soothe any pain or discomfort caused by hemorrhoids. Use hemorrhoid cream if your health care provider approves.  Rest with your legs elevated if you have leg cramps or low back pain.  If you develop varicose veins, wear support hose. Elevate your feet for 15 minutes, 3-4 times a day. Limit salt in your diet. Prenatal Care  Write down your questions. Take them to your prenatal visits.  Keep all your prenatal visits as told by your health care provider. This is important. Safety  Wear your seat belt at all times when driving.  Make a list of emergency phone numbers, including numbers for family, friends, the hospital, and police and fire departments. General instructions  Ask your health care provider for a referral to a local prenatal education class. Begin classes no later than the beginning of month 6 of your pregnancy.  Ask for help if you have counseling or nutritional needs during pregnancy. Your health care provider can offer advice or refer you to specialists for help with various needs.  Do not use hot tubs, steam rooms, or saunas.  Do not douche or use tampons or scented sanitary pads.  Do not cross your legs for long periods of time.  Avoid cat litter boxes and soil used by cats. These carry germs that can cause birth defects in the baby and possibly loss of the fetus by miscarriage or stillbirth.  Avoid all smoking, herbs, alcohol, and unprescribed drugs. Chemicals   in these products can affect the formation and growth of the baby.  Do not use any products that contain nicotine or tobacco, such as cigarettes and e-cigarettes. If you need help quitting, ask your health care provider.  Visit your dentist if you have not gone yet during your pregnancy. Use a soft toothbrush to brush your teeth and be gentle when  you floss. Contact a health care provider if:  You have dizziness.  You have mild pelvic cramps, pelvic pressure, or nagging pain in the abdominal area.  You have persistent nausea, vomiting, or diarrhea.  You have a bad smelling vaginal discharge.  You have pain when you urinate. Get help right away if:  You have a fever.  You are leaking fluid from your vagina.  You have spotting or bleeding from your vagina.  You have severe abdominal cramping or pain.  You have rapid weight gain or weight loss.  You have shortness of breath with chest pain.  You notice sudden or extreme swelling of your face, hands, ankles, feet, or legs.  You have not felt your baby move in over an hour.  You have severe headaches that do not go away when you take medicine.  You have vision changes. Summary  The second trimester is from week 14 through week 27 (months 4 through 6). It is also a time when the fetus is growing rapidly.  Your body goes through many changes during pregnancy. The changes vary from woman to woman.  Avoid all smoking, herbs, alcohol, and unprescribed drugs. These chemicals affect the formation and growth your baby.  Do not use any tobacco products, such as cigarettes, chewing tobacco, and e-cigarettes. If you need help quitting, ask your health care provider.  Contact your health care provider if you have any questions. Keep all prenatal visits as told by your health care provider. This is important. This information is not intended to replace advice given to you by your health care provider. Make sure you discuss any questions you have with your health care provider. Document Released: 10/06/2001 Document Revised: 11/17/2016 Document Reviewed: 11/17/2016 [employer] Interactive Patient Education  2018 [employer]   Contraception Choices Contraception, also called birth control, refers to methods or devices that prevent pregnancy. Hormonal methods Contraceptive  implant A contraceptive implant is a thin, plastic tube that contains a hormone. It is inserted into the upper part of the arm. It can remain in place for up to 3 years. Progestin-only injections Progestin-only injections are injections of progestin, a synthetic form of the hormone progesterone. They are given every 3 months by a health care provider. Birth control pills Birth control pills are pills that contain hormones that prevent pregnancy. They must be taken once a day, preferably at the same time each day. Birth control patch The birth control patch contains hormones that prevent pregnancy. It is placed on the skin and must be changed once a week for three weeks and removed on the fourth week. A prescription is needed to use this method of contraception. Vaginal ring A vaginal ring contains hormones that prevent pregnancy. It is placed in the vagina for three weeks and removed on the fourth week. After that, the process is repeated with a new ring. A prescription is needed to use this method of contraception. Emergency contraceptive Emergency contraceptives prevent pregnancy after unprotected sex. They come in pill form and can be taken up to 5 days after sex. They work best the sooner they are taken after having   sex. Most emergency contraceptives are available without a prescription. This method should not be used as your only form of birth control. Barrier methods Female condom A female condom is a thin sheath that is worn over the penis during sex. Condoms keep sperm from going inside a woman's body. They can be used with a spermicide to increase their effectiveness. They should be disposed after a single use. Female condom A female condom is a soft, loose-fitting sheath that is put into the vagina before sex. The condom keeps sperm from going inside a woman's body. They should be disposed after a single use. Diaphragm A diaphragm is a soft, dome-shaped barrier. It is inserted into the vagina  before sex, along with a spermicide. The diaphragm blocks sperm from entering the uterus, and the spermicide kills sperm. A diaphragm should be left in the vagina for 6-8 hours after sex and removed within 24 hours. A diaphragm is prescribed and fitted by a health care provider. A diaphragm should be replaced every 1-2 years, after giving birth, after gaining more than 15 lb (6.8 kg), and after pelvic surgery. Cervical cap A cervical cap is a round, soft latex or plastic cup that fits over the cervix. It is inserted into the vagina before sex, along with spermicide. It blocks sperm from entering the uterus. The cap should be left in place for 6-8 hours after sex and removed within 48 hours. A cervical cap must be prescribed and fitted by a health care provider. It should be replaced every 2 years. Sponge A sponge is a soft, circular piece of polyurethane foam with spermicide on it. The sponge helps block sperm from entering the uterus, and the spermicide kills sperm. To use it, you make it wet and then insert it into the vagina. It should be inserted before sex, left in for at least 6 hours after sex, and removed and thrown away within 30 hours. Spermicides Spermicides are chemicals that kill or block sperm from entering the cervix and uterus. They can come as a cream, jelly, suppository, foam, or tablet. A spermicide should be inserted into the vagina with an applicator at least 10-15 minutes before sex to allow time for it to work. The process must be repeated every time you have sex. Spermicides do not require a prescription. Intrauterine contraception Intrauterine device (IUD) An IUD is a T-shaped device that is put in a woman's uterus. There are two types:  Hormone IUD.This type contains progestin, a synthetic form of the hormone progesterone. This type can stay in place for 3-5 years.  Copper IUD.This type is wrapped in copper wire. It can stay in place for 10 years.  Permanent methods of  contraception Female tubal ligation In this method, a woman's fallopian tubes are sealed, tied, or blocked during surgery to prevent eggs from traveling to the uterus. Hysteroscopic sterilization In this method, a small, flexible insert is placed into each fallopian tube. The inserts cause scar tissue to form in the fallopian tubes and block them, so sperm cannot reach an egg. The procedure takes about 3 months to be effective. Another form of birth control must be used during those 3 months. Female sterilization This is a procedure to tie off the tubes that carry sperm (vasectomy). After the procedure, the man can still ejaculate fluid (semen). Natural planning methods Natural family planning In this method, a couple does not have sex on days when the woman could become pregnant. Calendar method This means keeping track of the   length of each menstrual cycle, identifying the days when pregnancy can happen, and not having sex on those days. Ovulation method In this method, a couple avoids sex during ovulation. Symptothermal method This method involves not having sex during ovulation. The woman typically checks for ovulation by watching changes in her temperature and in the consistency of cervical mucus. Post-ovulation method In this method, a couple waits to have sex until after ovulation. Summary  Contraception, also called birth control, means methods or devices that prevent pregnancy.  Hormonal methods of contraception include implants, injections, pills, patches, vaginal rings, and emergency contraceptives.  Barrier methods of contraception can include female condoms, female condoms, diaphragms, cervical caps, sponges, and spermicides.  There are two types of IUDs (intrauterine devices). An IUD can be put in a woman's uterus to prevent pregnancy for 3-5 years.  Permanent sterilization can be done through a procedure for males, females, or both.  Natural family planning methods involve  not having sex on days when the woman could become pregnant. This information is not intended to replace advice given to you by your health care provider. Make sure you discuss any questions you have with your health care provider. Document Released: 10/12/2005 Document Revised: 11/14/2016 Document Reviewed: 11/14/2016 Elsevier Interactive Patient Education  2018 Else[employer] Inc.   Breastfeeding Choosing to breastfeed is one of the best decisions you can make for yourself and your baby. A change in hormones during pregnancy causes your breasts to make breast milk in your milk-producing glands. Hormones prevent breast milk from being released before your baby is born. They also prompt milk flow after birth. Once breastfeeding has begun, thoughts of your baby, as well as his or her sucking or crying, can stimulate the release of milk from your milk-producing glands. Benefits of breastfeeding Research shows that breastfeeding offers many health benefits for infants and mothers. It also offers a cost-free and convenient way to feed your baby. For your baby  Your first milk (colostrum) helps your baby's digestive system to function better.  Special cells in your milk (antibodies) help your baby to fight off infections.  Breastfed babies are less likely to develop asthma, allergies, obesity, or type 2 diabetes. They are also at lower risk for sudden infant death syndrome (SIDS).  Nutrients in breast milk are better able to meet your baby's needs compared to infant formula.  Breast milk improves your baby's brain development. For you  Breastfeeding helps to create a very special bond between you and your baby.  Breastfeeding is convenient. Breast milk costs nothing and is always available at the correct temperature.  Breastfeeding helps to burn calories. It helps you to lose the weight that you gained during pregnancy.  Breastfeeding makes your uterus return faster to its size before pregnancy.  It also slows bleeding (lochia) after you give birth.  Breastfeeding helps to lower your risk of developing type 2 diabetes, osteoporosis, rheumatoid arthritis, cardiovascular disease, and breast, ovarian, uterine, and endometrial cancer later in life. Breastfeeding basics Starting breastfeeding  Find a comfortable place to sit or lie down, with your neck and back well-supported.  Place a pillow or a rolled-up blanket under your baby to bring him or her to the level of your breast (if you are seated). Nursing pillows are specially designed to help support your arms and your baby while you breastfeed.  Make sure that your baby's tummy (abdomen) is facing your abdomen.  Gently massage your breast. With your fingertips, massage from the outer edges of   your breast inward toward the nipple. This encourages milk flow. If your milk flows slowly, you may need to continue this action during the feeding.  Support your breast with 4 fingers underneath and your thumb above your nipple (make the letter "C" with your hand). Make sure your fingers are well away from your nipple and your baby's mouth.  Stroke your baby's lips gently with your finger or nipple.  When your baby's mouth is open wide enough, quickly bring your baby to your breast, placing your entire nipple and as much of the areola as possible into your baby's mouth. The areola is the colored area around your nipple. ? More areola should be visible above your baby's upper lip than below the lower lip. ? Your baby's lips should be opened and extended outward (flanged) to ensure an adequate, comfortable latch. ? Your baby's tongue should be between his or her lower gum and your breast.  Make sure that your baby's mouth is correctly positioned around your nipple (latched). Your baby's lips should create a seal on your breast and be turned out (everted).  It is common for your baby to suck about 2-3 minutes in order to start the flow of breast  milk. Latching Teaching your baby how to latch onto your breast properly is very important. An improper latch can cause nipple pain, decreased milk supply, and poor weight gain in your baby. Also, if your baby is not latched onto your nipple properly, he or she may swallow some air during feeding. This can make your baby fussy. Burping your baby when you switch breasts during the feeding can help to get rid of the air. However, teaching your baby to latch on properly is still the best way to prevent fussiness from swallowing air while breastfeeding. Signs that your baby has successfully latched onto your nipple  Silent tugging or silent sucking, without causing you pain. Infant's lips should be extended outward (flanged).  Swallowing heard between every 3-4 sucks once your milk has started to flow (after your let-down milk reflex occurs).  Muscle movement above and in front of his or her ears while sucking.  Signs that your baby has not successfully latched onto your nipple  Sucking sounds or smacking sounds from your baby while breastfeeding.  Nipple pain.  If you think your baby has not latched on correctly, slip your finger into the corner of your baby's mouth to break the suction and place it between your baby's gums. Attempt to start breastfeeding again. Signs of successful breastfeeding Signs from your baby  Your baby will gradually decrease the number of sucks or will completely stop sucking.  Your baby will fall asleep.  Your baby's body will relax.  Your baby will retain a small amount of milk in his or her mouth.  Your baby will let go of your breast by himself or herself.  Signs from you  Breasts that have increased in firmness, weight, and size 1-3 hours after feeding.  Breasts that are softer immediately after breastfeeding.  Increased milk volume, as well as a change in milk consistency and color by the fifth day of breastfeeding.  Nipples that are not sore,  cracked, or bleeding.  Signs that your baby is getting enough milk  Wetting at least 1-2 diapers during the first 24 hours after birth.  Wetting at least 5-6 diapers every 24 hours for the first week after birth. The urine should be clear or pale yellow by the age of 5   days.  Wetting 6-8 diapers every 24 hours as your baby continues to grow and develop.  At least 3 stools in a 24-hour period by the age of 5 days. The stool should be soft and yellow.  At least 3 stools in a 24-hour period by the age of 7 days. The stool should be seedy and yellow.  No loss of weight greater than 10% of birth weight during the first 3 days of life.  Average weight gain of 4-7 oz (113-198 g) per week after the age of 4 days.  Consistent daily weight gain by the age of 5 days, without weight loss after the age of 2 weeks. After a feeding, your baby may spit up a small amount of milk. This is normal. Breastfeeding frequency and duration Frequent feeding will help you make more milk and can prevent sore nipples and extremely full breasts (breast engorgement). Breastfeed when you feel the need to reduce the fullness of your breasts or when your baby shows signs of hunger. This is called "breastfeeding on demand." Signs that your baby is hungry include:  Increased alertness, activity, or restlessness.  Movement of the head from side to side.  Opening of the mouth when the corner of the mouth or cheek is stroked (rooting).  Increased sucking sounds, smacking lips, cooing, sighing, or squeaking.  Hand-to-mouth movements and sucking on fingers or hands.  Fussing or crying.  Avoid introducing a pacifier to your baby in the first 4-6 weeks after your baby is born. After this time, you may choose to use a pacifier. Research has shown that pacifier use during the first year of a baby's life decreases the risk of sudden infant death syndrome (SIDS). Allow your baby to feed on each breast as long as he or she  wants. When your baby unlatches or falls asleep while feeding from the first breast, offer the second breast. Because newborns are often sleepy in the first few weeks of life, you may need to awaken your baby to get him or her to feed. Breastfeeding times will vary from baby to baby. However, the following rules can serve as a guide to help you make sure that your baby is properly fed:  Newborns (babies 4 weeks of age or younger) may breastfeed every 1-3 hours.  Newborns should not go without breastfeeding for longer than 3 hours during the day or 5 hours during the night.  You should breastfeed your baby a minimum of 8 times in a 24-hour period.  Breast milk pumping Pumping and storing breast milk allows you to make sure that your baby is exclusively fed your breast milk, even at times when you are unable to breastfeed. This is especially important if you go back to work while you are still breastfeeding, or if you are not able to be present during feedings. Your lactation consultant can help you find a method of pumping that works best for you and give you guidelines about how long it is safe to store breast milk. Caring for your breasts while you breastfeed Nipples can become dry, cracked, and sore while breastfeeding. The following recommendations can help keep your breasts moisturized and healthy:  Avoid using soap on your nipples.  Wear a supportive bra designed especially for nursing. Avoid wearing underwire-style bras or extremely tight bras (sports bras).  Air-dry your nipples for 3-4 minutes after each feeding.  Use only cotton bra pads to absorb leaked breast milk. Leaking of breast milk between feedings is normal.    Use lanolin on your nipples after breastfeeding. Lanolin helps to maintain your skin's normal moisture barrier. Pure lanolin is not harmful (not toxic) to your baby. You may also hand express a few drops of breast milk and gently massage that milk into your nipples and  allow the milk to air-dry.  In the first few weeks after giving birth, some women experience breast engorgement. Engorgement can make your breasts feel heavy, warm, and tender to the touch. Engorgement peaks within 3-5 days after you give birth. The following recommendations can help to ease engorgement:  Completely empty your breasts while breastfeeding or pumping. You may want to start by applying warm, moist heat (in the shower or with warm, water-soaked hand towels) just before feeding or pumping. This increases circulation and helps the milk flow. If your baby does not completely empty your breasts while breastfeeding, pump any extra milk after he or she is finished.  Apply ice packs to your breasts immediately after breastfeeding or pumping, unless this is too uncomfortable for you. To do this: ? Put ice in a plastic bag. ? Place a towel between your skin and the bag. ? Leave the ice on for 20 minutes, 2-3 times a day.  Make sure that your baby is latched on and positioned properly while breastfeeding.  If engorgement persists after 48 hours of following these recommendations, contact your health care provider or a lactation consultant. Overall health care recommendations while breastfeeding  Eat 3 healthy meals and 3 snacks every day. Well-nourished mothers who are breastfeeding need an additional 450-500 calories a day. You can meet this requirement by increasing the amount of a balanced diet that you eat.  Drink enough water to keep your urine pale yellow or clear.  Rest often, relax, and continue to take your prenatal vitamins to prevent fatigue, stress, and low vitamin and mineral levels in your body (nutrient deficiencies).  Do not use any products that contain nicotine or tobacco, such as cigarettes and e-cigarettes. Your baby may be harmed by chemicals from cigarettes that pass into breast milk and exposure to secondhand smoke. If you need help quitting, ask your health care  provider.  Avoid alcohol.  Do not use illegal drugs or marijuana.  Talk with your health care provider before taking any medicines. These include over-the-counter and prescription medicines as well as vitamins and herbal supplements. Some medicines that may be harmful to your baby can pass through breast milk.  It is possible to become pregnant while breastfeeding. If birth control is desired, ask your health care provider about options that will be safe while breastfeeding your baby. Where to find more information: [employer]: www.llli.org Contact a health care provider if:  You feel like you want to stop breastfeeding or have become frustrated with breastfeeding.  Your nipples are cracked or bleeding.  Your breasts are red, tender, or warm.  You have: ? Painful breasts or nipples. ? A swollen area on either breast. ? A fever or chills. ? Nausea or vomiting. ? Drainage other than breast milk from your nipples.  Your breasts do not become full before feedings by the fifth day after you give birth.  You feel sad and depressed.  Your baby is: ? Too sleepy to eat well. ? Having trouble sleeping. ? More than 1 week old and wetting fewer than 6 diapers in a 24-hour period. ? Not gaining weight by 5 days of age.  Your baby has fewer than 3 stools in a   24-hour period.  Your baby's skin or the white parts of his or her eyes become yellow. Get help right away if:  Your baby is overly tired (lethargic) and does not want to wake up and feed.  Your baby develops an unexplained fever. Summary  Breastfeeding offers many health benefits for infant and mothers.  Try to breastfeed your infant when he or she shows early signs of hunger.  Gently tickle or stroke your baby's lips with your finger or nipple to allow the baby to open his or her mouth. Bring the baby to your breast. Make sure that much of the areola is in your baby's mouth. Offer one side and burp the  baby before you offer the other side.  Talk with your health care provider or lactation consultant if you have questions or you face problems as you breastfeed. This information is not intended to replace advice given to you by your health care provider. Make sure you discuss any questions you have with your health care provider. Document Released: 10/12/2005 Document Revised: 11/13/2016 Document Reviewed: 11/13/2016 Elsevier Interactive Patient Education  2018 Elsevier Inc.  

## 2017-12-17 LAB — OBSTETRIC PANEL
ANTIBODY SCREEN: NOT DETECTED
BASOS PCT: 0.4 %
Basophils Absolute: 42 cells/uL (ref 0–200)
EOS PCT: 1 %
Eosinophils Absolute: 105 cells/uL (ref 15–500)
HCT: 39.7 % (ref 35.0–45.0)
Hemoglobin: 14.3 g/dL (ref 11.7–15.5)
Hepatitis B Surface Ag: NONREACTIVE
Lymphs Abs: 2258 cells/uL (ref 850–3900)
MCH: 32.6 pg (ref 27.0–33.0)
MCHC: 36 g/dL (ref 32.0–36.0)
MCV: 90.6 fL (ref 80.0–100.0)
MPV: 9.9 fL (ref 7.5–12.5)
Monocytes Relative: 5.6 %
NEUTROS ABS: 7508 {cells}/uL (ref 1500–7800)
Neutrophils Relative %: 71.5 %
Platelets: 297 10*3/uL (ref 140–400)
RBC: 4.38 10*6/uL (ref 3.80–5.10)
RDW: 12.7 % (ref 11.0–15.0)
RPR Ser Ql: NONREACTIVE
RUBELLA: 4.39 {index}
Total Lymphocyte: 21.5 %
WBC: 10.5 10*3/uL (ref 3.8–10.8)
WBCMIX: 588 {cells}/uL (ref 200–950)

## 2017-12-17 LAB — GC/CHLAMYDIA PROBE AMP (~~LOC~~) NOT AT ARMC
Chlamydia: NEGATIVE
Neisseria Gonorrhea: NEGATIVE

## 2017-12-17 LAB — HIV ANTIBODY (ROUTINE TESTING W REFLEX): HIV 1&2 Ab, 4th Generation: NONREACTIVE

## 2017-12-18 LAB — CULTURE, OB URINE

## 2017-12-18 LAB — URINE CULTURE, OB REFLEX

## 2017-12-20 ENCOUNTER — Other Ambulatory Visit: Payer: Self-pay | Admitting: Obstetrics and Gynecology

## 2017-12-20 DIAGNOSIS — O0990 Supervision of high risk pregnancy, unspecified, unspecified trimester: Secondary | ICD-10-CM

## 2017-12-24 ENCOUNTER — Ambulatory Visit (HOSPITAL_COMMUNITY)
Admission: RE | Admit: 2017-12-24 | Discharge: 2017-12-24 | Disposition: A | Discharge status: Home / Self Care | Payer: PRIVATE HEALTH INSURANCE | Source: Ambulatory Visit | Attending: Obstetrics & Gynecology | Admitting: Obstetrics & Gynecology

## 2017-12-24 DIAGNOSIS — O09529 Supervision of elderly multigravida, unspecified trimester: Secondary | ICD-10-CM

## 2017-12-27 ENCOUNTER — Other Ambulatory Visit: Payer: Self-pay | Admitting: Obstetrics and Gynecology

## 2017-12-27 ENCOUNTER — Encounter: Payer: Self-pay | Admitting: *Deleted

## 2017-12-27 ENCOUNTER — Telehealth: Payer: Self-pay | Admitting: *Deleted

## 2017-12-27 DIAGNOSIS — E039 Hypothyroidism, unspecified: Secondary | ICD-10-CM

## 2017-12-27 DIAGNOSIS — O09529 Supervision of elderly multigravida, unspecified trimester: Secondary | ICD-10-CM

## 2017-12-27 DIAGNOSIS — O24119 Pre-existing diabetes mellitus, type 2, in pregnancy, unspecified trimester: Secondary | ICD-10-CM

## 2017-12-27 DIAGNOSIS — O99280 Endocrine, nutritional and metabolic diseases complicating pregnancy, unspecified trimester: Secondary | ICD-10-CM

## 2017-12-27 DIAGNOSIS — Z3A11 11 weeks gestation of pregnancy: Secondary | ICD-10-CM | POA: Insufficient documentation

## 2017-12-27 DIAGNOSIS — O0990 Supervision of high risk pregnancy, unspecified, unspecified trimester: Secondary | ICD-10-CM

## 2017-12-27 NOTE — Telephone Encounter (Signed)
Received a call from [facility] counselor named Thomasene RippleKoosha stating she is calling with panorma results - came back high risk due to fetal dna fraction elevated at 1:17 for risk of triploidy, trisomy18, trisomy 2513.  States was low at 2.7% fetal fraction wihich was run thru additional analysis and determined to be low for her weight /age/ gestational age. Therefore flagged high risk. We recommend genetic counseling, and optional testing. They can accept redraw if patient declines genetic counseling, testing.  Any questions call [phone].    Per chart review saw genetic counselor on 12/24/17 due to ama and note states results of panorama not back yet. I called and spoke with Clydie BraunKaren the genetic counselor and she recommends we offer the patient a first screen and to see genetic counselor on the same day.    I called Marchel[facility] Folksmanda and left a message I am calling with some results- please call our office. I entered orders for first screen and genetic counseling and scheduled for 01/07/18 11:30.

## 2017-12-27 NOTE — Progress Notes (Signed)
Genetic Counseling  High-Risk Gestation Note  Appointment Date:  12/117 Referred By: Kelli Antigua, MD Date of Birth:  Nov 09, 1980 Partner:  Kelli Miller   Pregnancy History: [id number] Estimated Date of Delivery: 07/11/18 Estimated Gestational Age: [redacted]w[redacted]d Attending: Particia Nearing, MD   Kelli Miller was seen for genetic counseling because of a maternal age of 37 y.o..  Her mother was also present for today's visit.    In summary:  Discussed AMA and associated risk for fetal aneuploidy  Discussed options for screening  First screen- declined at this time given that NIPS is pending  NIPS- Panorama drawn through OB, pending at this time  Ultrasound- discussed NT ultrasound not indicated if NIPS within normal limits, but NT can be performed pending different results from NIPS, if desired  Detailed ultrasound available in second trimester  Discussed diagnostic testing options  [facility]- declined  Amniocentesis- declined  Reviewed family history concerns  Discussed carrier screening options- declined  CF  SMA  Hemoglobinopathies  She was counseled regarding maternal age and the association with risk for chromosome conditions due to nondisjunction with aging of the ova.   We reviewed chromosomes, nondisjunction, and the associated 1 in 37 risk for fetal aneuploidy related to a maternal age of 37 y.o. at [redacted]w[redacted]d gestation.  She was counseled that the risk for aneuploidy decreases as gestational age increases, accounting for those pregnancies which spontaneously abort.  We specifically discussed Down syndrome (trisomy 50), trisomies 30 and 69, and sex chromosome aneuploidies (47,XXX and 47,XXY) including the common features and prognoses of each.   We reviewed available screening options including First Screen, Quad screen, noninvasive prenatal screening (NIPS)/cell free DNA (cfDNA) screening, and detailed ultrasound.  She was counseled that screening tests are used to modify a  patient's a priori risk for aneuploidy, typically based on age. This estimate provides a pregnancy specific risk assessment. We reviewed the benefits and limitations of each option. Specifically, we discussed the conditions for which each test screens, the detection rates, and false positive rates of each. She was also counseled regarding diagnostic testing via [facility] and amniocentesis. We reviewed the approximate 1 in 300-500 risk for complications from amniocentesis, including spontaneous pregnancy loss. We discussed the possible results that the tests might provide including: positive, negative, unanticipated, and no result. Regarding NIPS, we specifically discussed that the chance for no results can be increased by various factors including maternal BMI, maternal use of anticoagulants, and early gestational age. Finally, she was counseled regarding the cost of each option and potential out of pocket expenses.  Kelli Miller had NIPS (Panorama through [facility]) drawn through her OB provider on 12/16/17. These results were pending at the time of today's visit. Regarding the utility of NT ultrasound, we discussed that [facility] guidelines indicate the NT ultrasound is not indicated following NIPS within normal limits. However, if the patient's NIPS is not informative or is not within normal limits, then NT ultrasound would be an available screening option to her. We discussed that this can be discussed by her OB provider pending the results of her NIPS. Detailed ultrasound is available to the patient at approximately [redacted] weeks gestation. Kelli Miller declined [facility] and amniocentesis in the pregnancy. She understands that screening tests cannot rule out all birth defects or genetic syndromes. The patient was advised of this limitation and states she still does not want additional testing at this time.   Kelli Miller was provided with written information regarding cystic fibrosis (CF), spinal muscular atrophy (SMA)  and  hemoglobinopathies including the carrier frequency, availability of carrier screening and prenatal diagnosis if indicated.  In addition, we discussed that CF and hemoglobinopathies are routinely screened for as part of the [place] newborn screening panel.   [facility] Kelli Miller, but carrier screening results were not in these records. The patient was unsure if these were performed. Records indicated peripheral blood karyotype analysis for the patient and her partner, Kelli Miller, were within normal limits for each. After further discussion, she declined screening for CF, SMA and hemoglobinopathies.  Both family histories were reviewed and found to be noncontributory for birth defects, intellectual disability, and known genetic conditions. Kelli Miller has a history of one first trimester miscarriage with a previous partner and two first trimester miscarriages with her current partner. The patient reported Caucasian ancestry, and she reported African American ancestry for her husband. Consanguinity was denied. Without further information regarding the provided family history, an accurate genetic risk cannot be calculated. Further genetic counseling is warranted if more information is obtained.  Kelli Miller denied exposure to environmental toxins or chemical agents. She is currently taking Lovenox during the pregnancy. She denied the use of alcohol, tobacco or street drugs. She denied significant viral illnesses during the course of her pregnancy.   I counseled Kelli Miller regarding the above risks and available options.  The approximate face-to-face time with the genetic counselor was 45 minutes.  Kelli PlowmanKaren Sharad Vaneaton, MS,  Certified Genetic Counselor 12/27/2017

## 2017-12-28 ENCOUNTER — Telehealth (HOSPITAL_COMMUNITY): Payer: Self-pay | Admitting: MS"

## 2017-12-28 ENCOUNTER — Other Ambulatory Visit: Payer: Self-pay

## 2017-12-28 NOTE — Telephone Encounter (Signed)
Patient called to inquire about [facility], and patient had questions about results given that follow-up ultrasound and genetic counseling was scheduled. Patient identified by name and DOB. We briefly discussed that Panorama did not yield results due to low fetal fraction in the sample. While this is technically flagged as a high risk for fetal aneuploidy from the lab due to the lab's internal data, there are various explanations for low fetal fraction including increased maternal BMI, maternal use of anticoagulants and early gestational age. Patient expressed that she feels more relief after talking through this and understanding some of the various factors that can cause low fetal fraction on NIPS. We discussed that first trimester screen is scheduled and genetic counseling that day to further discuss these results and discuss follow-up screening and testing options for the patient on 01/07/18.   Clydie BraunKaren Meloney Feld 12/28/2017 9:19 AM

## 2017-12-28 NOTE — Telephone Encounter (Signed)
Patient called and left message on nurse line stating she is trying to reach us to return our phone call. Per chart review, patient talked with genetic counselor in [facility] earlier today. Called patient and she is aware of the results & scheduled appt. Patient had no questions

## 2017-12-29 ENCOUNTER — Encounter: Payer: Self-pay | Admitting: Obstetrics & Gynecology

## 2017-12-29 ENCOUNTER — Ambulatory Visit (INDEPENDENT_AMBULATORY_CARE_PROVIDER_SITE_OTHER): Payer: PRIVATE HEALTH INSURANCE | Admitting: Obstetrics & Gynecology

## 2017-12-29 VITALS — BP 144/79 | HR 87 | Wt 216.0 lb

## 2017-12-29 DIAGNOSIS — O0990 Supervision of high risk pregnancy, unspecified, unspecified trimester: Secondary | ICD-10-CM

## 2017-12-29 DIAGNOSIS — O99210 Obesity complicating pregnancy, unspecified trimester: Secondary | ICD-10-CM

## 2017-12-29 DIAGNOSIS — E039 Hypothyroidism, unspecified: Secondary | ICD-10-CM

## 2017-12-29 DIAGNOSIS — O24119 Pre-existing diabetes mellitus, type 2, in pregnancy, unspecified trimester: Secondary | ICD-10-CM

## 2017-12-29 DIAGNOSIS — O10919 Unspecified pre-existing hypertension complicating pregnancy, unspecified trimester: Secondary | ICD-10-CM

## 2017-12-29 DIAGNOSIS — O09529 Supervision of elderly multigravida, unspecified trimester: Secondary | ICD-10-CM

## 2017-12-29 MED ORDER — LABETALOL HCL 200 MG PO TABS: 200.0000 mg | ORAL_TABLET | Freq: Two times a day (BID) | ORAL | 3 refills | Status: DC

## 2017-12-29 NOTE — Addendum Note (Signed)
Addended by: Granville LewisLARK, Demar Shad L on: 12/29/2017 02:39 PM   Modules accepted: Orders

## 2017-12-29 NOTE — Progress Notes (Signed)
   PRENATAL VISIT NOTE  Subjective:  Kelli Miller is a 37 y.o. G4P0030 at 6444w2d being seen today for ongoing prenatal care.  She is currently monitored for the following issues for this low-risk pregnancy and has Supervision of high risk pregnancy, antepartum; Type 2 diabetes mellitus affecting pregnancy, antepartum; Hypothyroidism affecting pregnancy, antepartum; Advanced maternal age in multigravida; [redacted] weeks gestation of pregnancy; and Obesity in pregnancy on their problem list.  Patient reports no complaints.  Contractions: Not present. Vag. Bleeding: None.  Movement: Absent. Denies leaking of fluid.   The following portions of the patient's history were reviewed and updated as appropriate: allergies, current medications, past family history, past medical history, past social history, past surgical history and problem list. Problem list updated.  Objective:   Vitals:   12/29/17 0817  BP: (!) 144/79  Pulse: 87  Weight: 216 lb (98 kg)    Fetal Status: Fetal Heart Rate (bpm): 156   Movement: Absent     General:  Alert, oriented and cooperative. Patient is in no acute distress.  Skin: Skin is warm and dry. No rash noted.   Cardiovascular: Normal heart rate noted  Respiratory: Normal respiratory effort, no problems with respiration noted  Abdomen: Soft, gravid, appropriate for gestational age.  Pain/Pressure: Absent     Pelvic: Cervical exam deferred        Extremities: Normal range of motion.  Edema: None  Mental Status:  Normal mood and affect. Normal behavior. Normal judgment and thought content.   Assessment and Plan:  Pregnancy: G4P0030 at 6644w2d  1. Supervision of high risk pregnancy, antepartum - she has an appt with MFM on  2. Antepartum multigravida of advanced maternal age - inadequate NIPS, will get First screen 01-07-18  3. Type 2 diabetes mellitus affecting pregnancy, antepartum - She works a very odd swing shift and regular scheduled insulin would not likely work  for her so I would suggest an insulin pump - Ambulatory referral to Endocrinology  4. Obesity in pregnancy - She has an ordered nutrition consult and promises that she will make the appt  5. Chronic HTN in pregnancy- start Labetalol 200 mg BID, will need baseline labs, 24 hour urine  Preterm labor symptoms and general obstetric precautions including but not limited to vaginal bleeding, contractions, leaking of fluid and fetal movement were reviewed in detail with the patient. Please refer to After Visit Summary for other counseling recommendations.  No Follow-up on file.   Allie BossierMyra C Jaber Dunlow, MD

## 2017-12-30 LAB — PROTEIN / CREATININE RATIO, URINE: Creatinine, Urine: 22 mg/dL (ref 20–275)

## 2017-12-30 NOTE — Addendum Note (Signed)
Addended by: Granville LewisLARK, LORA L on: 12/30/2017 03:02 PM   Modules accepted: Orders

## 2017-12-31 LAB — COMPREHENSIVE METABOLIC PANEL
AG Ratio: 1.5 (calc) (ref 1.0–2.5)
ALKALINE PHOSPHATASE (APISO): 30 U/L — AB (ref 33–115)
ALT: 11 U/L (ref 6–29)
AST: 10 U/L (ref 10–30)
Albumin: 3.9 g/dL (ref 3.6–5.1)
BUN: 9 mg/dL (ref 7–25)
CHLORIDE: 104 mmol/L (ref 98–110)
CO2: 23 mmol/L (ref 20–32)
CREATININE: 0.5 mg/dL (ref 0.50–1.10)
Calcium: 9.7 mg/dL (ref 8.6–10.2)
GLOBULIN: 2.6 g/dL (ref 1.9–3.7)
GLUCOSE: 83 mg/dL (ref 65–99)
Potassium: 4 mmol/L (ref 3.5–5.3)
Sodium: 137 mmol/L (ref 135–146)
Total Bilirubin: 0.3 mg/dL (ref 0.2–1.2)
Total Protein: 6.5 g/dL (ref 6.1–8.1)

## 2017-12-31 LAB — PROTEIN, URINE, 24 HOUR: PROTEIN 24H UR: 125 mg/(24.h) (ref 0–149)

## 2017-12-31 LAB — HEMOGLOBIN A1C
HEMOGLOBIN A1C: 4.9 %{Hb} (ref <5.7)
MEAN PLASMA GLUCOSE: 94 (calc)
eAG (mmol/L): 5.2 (calc)

## 2017-12-31 LAB — TSH: TSH: 1.25 m[IU]/L

## 2018-01-07 ENCOUNTER — Ambulatory Visit (HOSPITAL_COMMUNITY): Admission: RE | Admit: 2018-01-07 | Payer: PRIVATE HEALTH INSURANCE | Source: Ambulatory Visit

## 2018-01-07 ENCOUNTER — Ambulatory Visit (HOSPITAL_COMMUNITY)
Admission: RE | Admit: 2018-01-07 | Discharge: 2018-01-07 | Disposition: A | Discharge status: Home / Self Care | Payer: PRIVATE HEALTH INSURANCE | Source: Ambulatory Visit | Attending: Obstetrics and Gynecology | Admitting: Obstetrics and Gynecology

## 2018-01-07 ENCOUNTER — Other Ambulatory Visit (HOSPITAL_COMMUNITY): Payer: Self-pay | Admitting: *Deleted

## 2018-01-07 ENCOUNTER — Other Ambulatory Visit: Payer: Self-pay | Admitting: Obstetrics and Gynecology

## 2018-01-07 ENCOUNTER — Encounter (HOSPITAL_COMMUNITY): Payer: Self-pay

## 2018-01-07 DIAGNOSIS — E039 Hypothyroidism, unspecified: Secondary | ICD-10-CM

## 2018-01-07 DIAGNOSIS — Z315 Encounter for genetic counseling: Secondary | ICD-10-CM | POA: Diagnosis present

## 2018-01-07 DIAGNOSIS — O09529 Supervision of elderly multigravida, unspecified trimester: Secondary | ICD-10-CM

## 2018-01-07 DIAGNOSIS — O99280 Endocrine, nutritional and metabolic diseases complicating pregnancy, unspecified trimester: Secondary | ICD-10-CM

## 2018-01-07 DIAGNOSIS — O24311 Unspecified pre-existing diabetes mellitus in pregnancy, first trimester: Secondary | ICD-10-CM | POA: Insufficient documentation

## 2018-01-07 DIAGNOSIS — O10011 Pre-existing essential hypertension complicating pregnancy, first trimester: Secondary | ICD-10-CM | POA: Diagnosis not present

## 2018-01-07 DIAGNOSIS — O09521 Supervision of elderly multigravida, first trimester: Secondary | ICD-10-CM | POA: Insufficient documentation

## 2018-01-07 DIAGNOSIS — O0990 Supervision of high risk pregnancy, unspecified, unspecified trimester: Secondary | ICD-10-CM

## 2018-01-07 DIAGNOSIS — Z3682 Encounter for antenatal screening for nuchal translucency: Secondary | ICD-10-CM | POA: Diagnosis not present

## 2018-01-07 DIAGNOSIS — Z3A13 13 weeks gestation of pregnancy: Secondary | ICD-10-CM | POA: Insufficient documentation

## 2018-01-07 DIAGNOSIS — E079 Disorder of thyroid, unspecified: Secondary | ICD-10-CM

## 2018-01-07 DIAGNOSIS — O99211 Obesity complicating pregnancy, first trimester: Secondary | ICD-10-CM | POA: Insufficient documentation

## 2018-01-07 DIAGNOSIS — O99119 Other diseases of the blood and blood-forming organs and certain disorders involving the immune mechanism complicating pregnancy, unspecified trimester: Secondary | ICD-10-CM

## 2018-01-07 DIAGNOSIS — O24119 Pre-existing diabetes mellitus, type 2, in pregnancy, unspecified trimester: Secondary | ICD-10-CM

## 2018-01-07 DIAGNOSIS — O10012 Pre-existing essential hypertension complicating pregnancy, second trimester: Secondary | ICD-10-CM

## 2018-01-07 DIAGNOSIS — D6861 Antiphospholipid syndrome: Secondary | ICD-10-CM

## 2018-01-07 DIAGNOSIS — O99111 Other diseases of the blood and blood-forming organs and certain disorders involving the immune mechanism complicating pregnancy, first trimester: Secondary | ICD-10-CM | POA: Diagnosis not present

## 2018-01-07 DIAGNOSIS — O289 Unspecified abnormal findings on antenatal screening of mother: Secondary | ICD-10-CM | POA: Insufficient documentation

## 2018-01-07 IMAGING — US US MFM OB COMPLETE +14 WKS
1 series · 15 of 28 positions shown · non-contrast
Comparison: none

[Series 1: us mfm ob complete +14 wks · 15 of 42 slices shown]
[im 1/42]
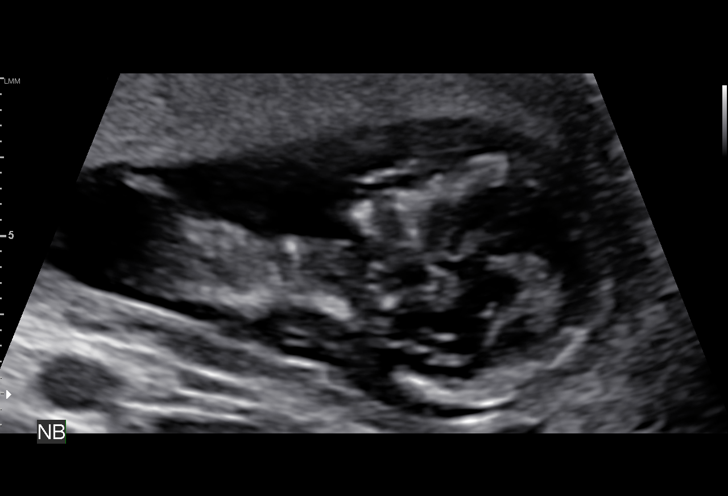
[im 4/42]
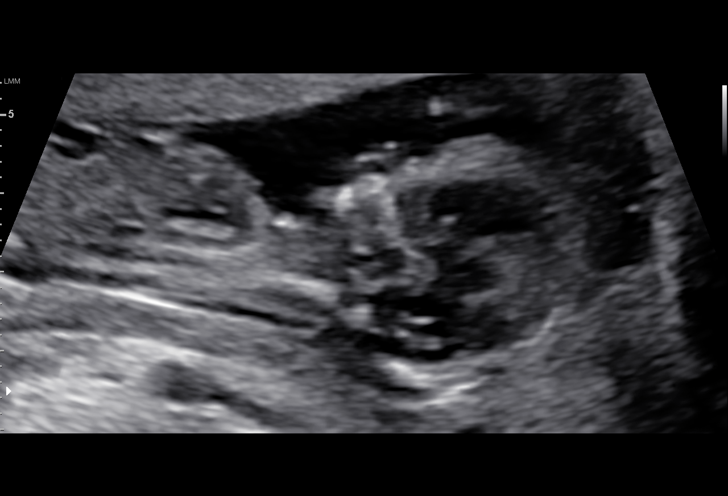
[im 7/42]
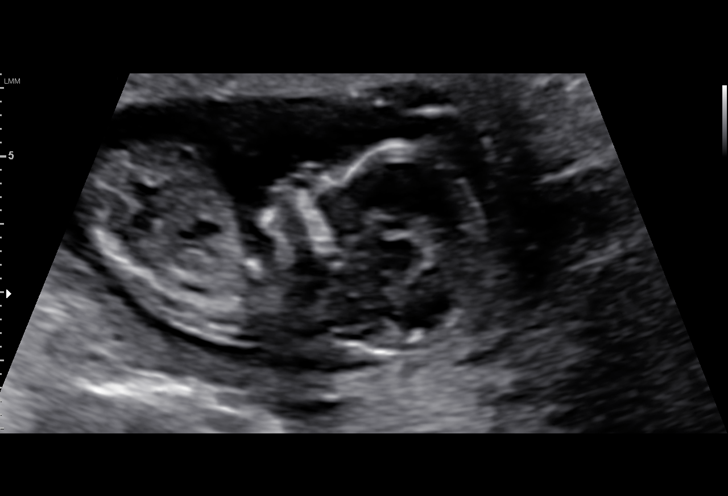
[im 10/42]
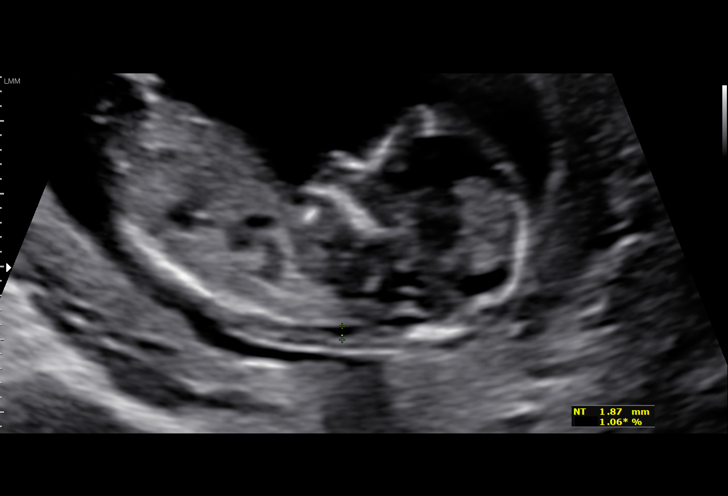
[im 13/42]
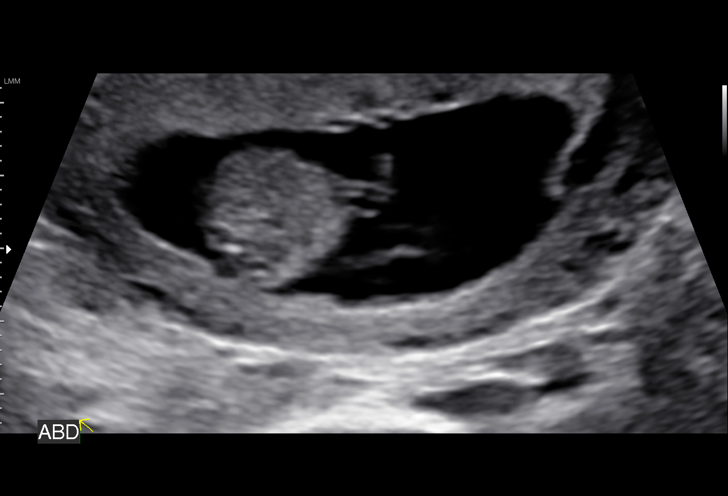
[im 16/42]
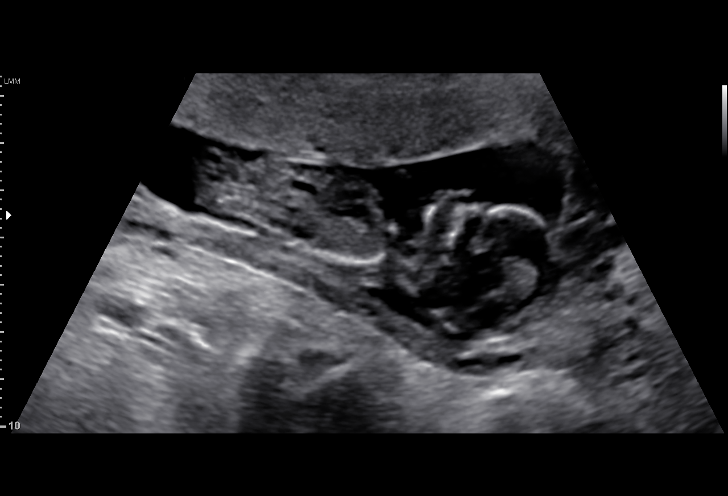
[im 19/42]
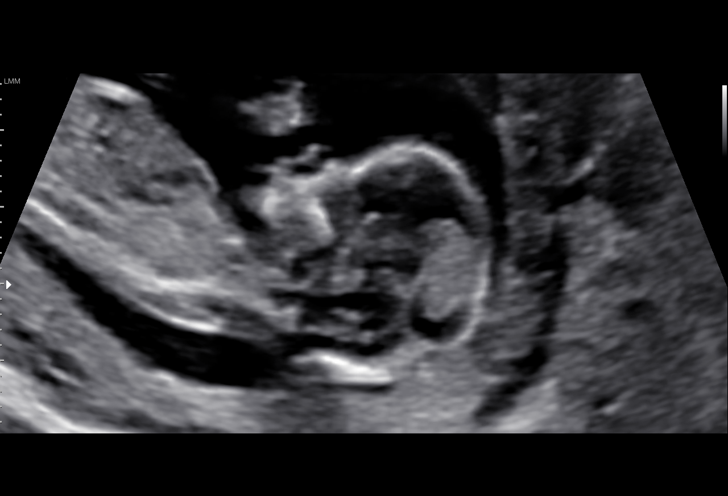
[im 22/42]
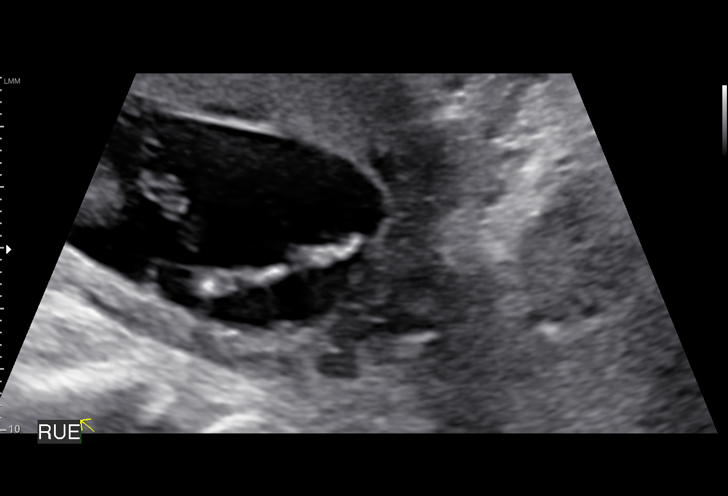
[im 23/42]
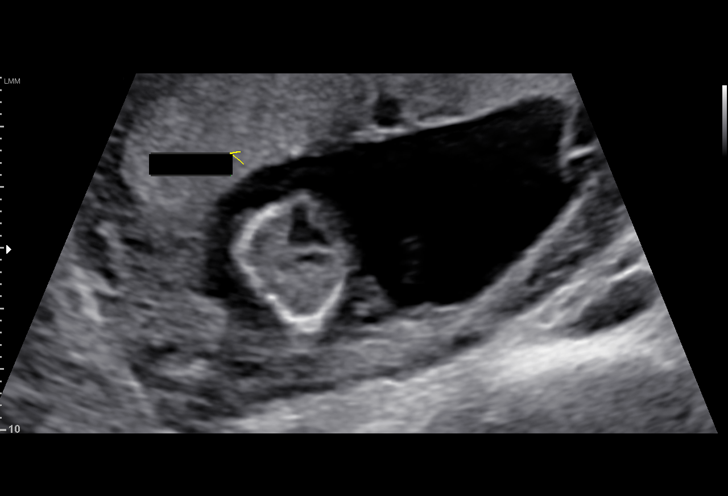
[im 26/42]
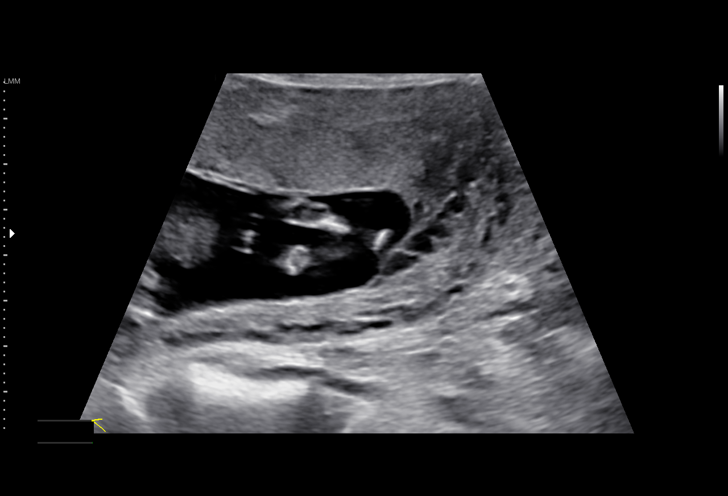
[im 29/42]
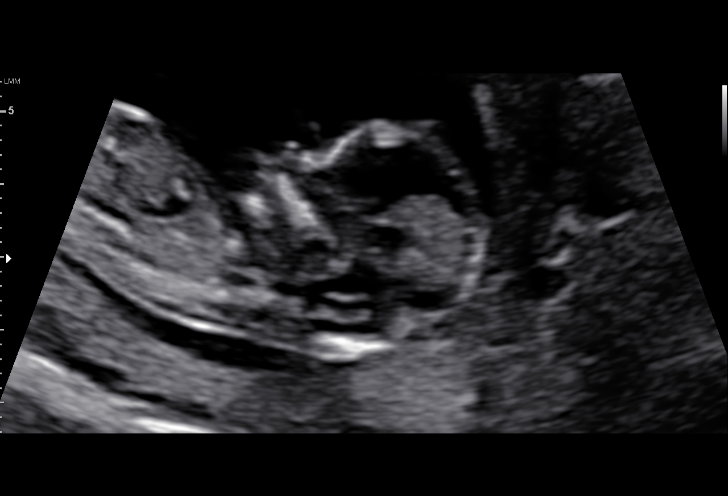
[im 32/42]
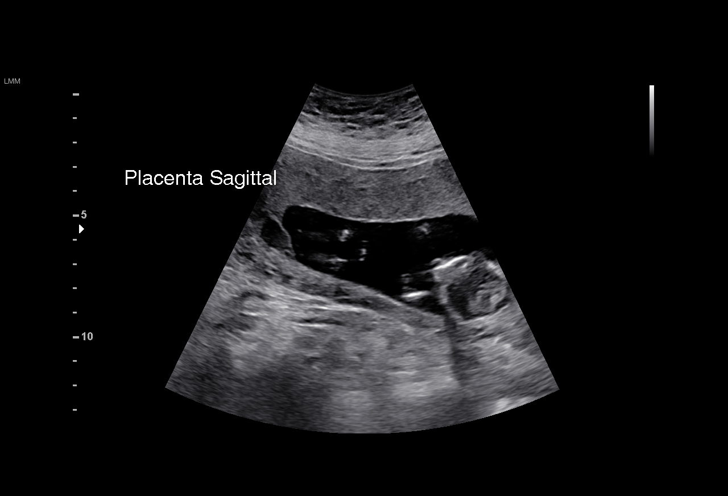
[im 35/42]
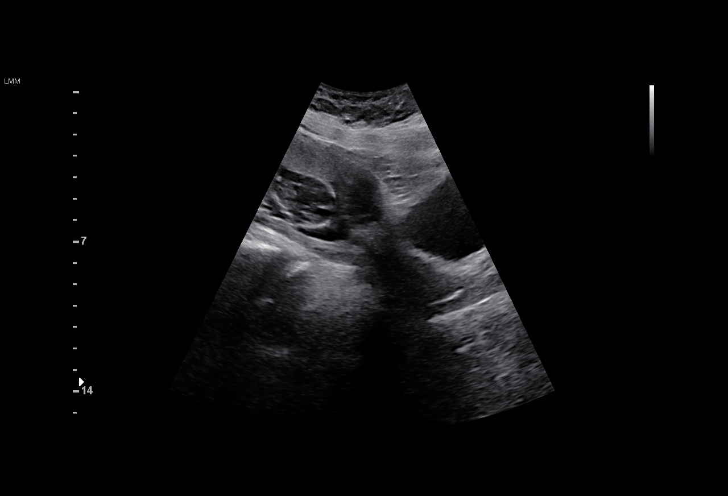
[im 38/42]
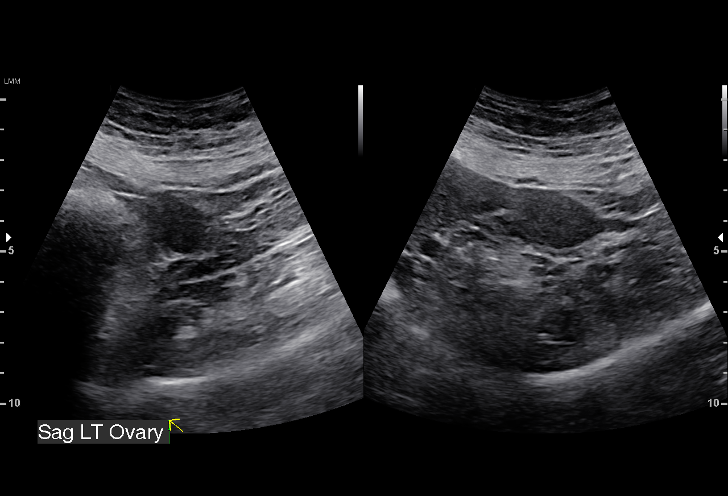
[im 42/42]
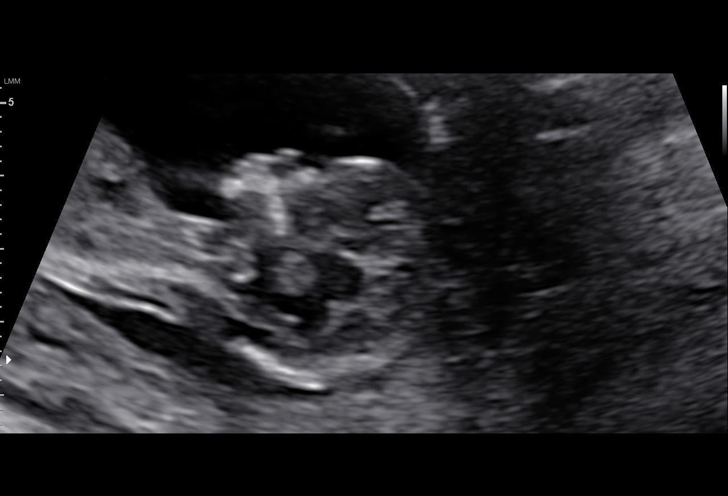

[15 of 28 positions shown; findings below may reference images not displayed]

[REDACTED]. [HOSPITAL]
DO

WEEKS

1  FADANA           [PHONE_NUMBER]      [PHONE_NUMBER]     [PHONE_NUMBER]
Indications

13 weeks gestation of pregnancy
Encounter for nuchal translucency              [JN]
Diabetes - Pregestational, 2nd trimester       [JN]
(metformin)
Advanced maternal age multigravida 35+,        [JN]
second trimester
Obesity complicating pregnancy, second         [JN]
trimester
Hypothyroid (synthroid)                        [JN] [JN]
Hypertension - Chronic/Pre-existing            [JN]
(labetalol)
Antiphospholipid syndrome complicating         O99.119, [JN]
pregnancy, antepartum (heparin)
OB History

Blood Type:            Height:  5'1"   Weight (lb):  212       BMI:
Gravidity:    4          SAB:   3
Living:       0
Fetal Evaluation

Num Of Fetuses:     1
Fetal Heart         145
Rate(bpm):
Cardiac Activity:   Observed
Presentation:       Variable
Placenta:           Anterior, above cervical os

Amniotic Fluid
AFI FV:      Subjectively within normal limits
Biometry
CRL:      78.6  mm     G. Age:  13w 4d                  EDD:   [DATE]
Gestational Age

LMP:           13w 4d        Date:  [DATE]                 EDD:   [DATE]
Best:          13w 4d     Det. By:  LMP  ([DATE])          EDD:   [DATE]
1st Trimester Genetic Sonogram Screening

CRL:            78.6  mm    G. Age:   13w 4d                 EDD:   [DATE]
Nasal Bone:                 Present
Anatomy

Cranium:               Appears normal         Upper Extremities:      Present
Stomach:               Appears normal, left   Lower Extremities:      Present
sided
Bladder:               Appears normal
Impression

Single living intrauterine pregnancy at 13w 4d.
Unable to complete nuchal translucency measurement due to
persistent fetal position.
Recommendations

Couple is meeting with our genetic counselor after this
ultrasound to discuss further testing options
Fetal anatomic survey at 
18-20 weeks gestation

## 2018-01-07 NOTE — Progress Notes (Signed)
Genetic Counseling  High-Risk Gestation Note  Appointment Date:  01/07/2018 Referred By: Catalina Antigua, MD Date of Birth:  [birth year]   Pregnancy History: G4P0030 Estimated Date of Delivery: 07/11/18 Estimated Gestational Age: [redacted]w[redacted]d Attending: Ledon Snare, MD  Ms. Cyann Venti was seen for follow-up genetic counseling given that NIPS/prenatal cell free DNA testing through her Ob provider yielded high risk due to low fetal fraction.   In summary:   Reviewed AMA and associated risk for fetal aneuploidy  Reviewed results of NIPS ([facility])- low fetal fraction ? Low fetal fraction- associated with overall 1 in 17 risk for underlying fetal aneuploidy (trisomy 35, trisomy 49, or triploidy) in pregnancies ? Specific analysis for fetal aneuploidy conditions unable to be performed due to low fetal fraction ? Reviewed other possible explanations for low fetal fraction including maternal BMI and maternal anticoagulant use  Discussed options for screening ? First screen- NT unable to be obtained today; first screen not able to be performed ? Quad screen -declined ? Repeat NIPS at later gestation and/or with different methodology- declined  Ultrasound- detailed scheduled 02/10/18  Discussed diagnostic testing options ? [facility]S-declined ? Amniocentesis- declined   Mrs. [name] previously had noninvasive prenatal screening (NIPS)/prenatal cell free DNA testing performed through her OB provider. This screening, specifically [facility] through [facility] laboratory identified low fetal fraction (2.7%), which is reported to have a 1 in 17 risk for underlying fetal aneuploidy (trisomy 72, trisomy 35, or triploidy). We reviewed the methodology of NIPS and discussed differential diagnoses for low fetal fraction including obesity, early gestational age, physiological differences in maternal blood, physiological differences in the placenta, maternal use of anticoagulants, and underlying fetal aneuploidy.  Additionally, we discussed that there may be additional factors that impact fetal fraction in pregnancy that are not yet reported in the medical literature. When fetal fraction is on average less than 3.5-4%, [facility] has in recent years not been able to perform risk assessment for fetal aneuploidy conditions.  Recent outcome study data from pregnancies with low fetal fraction from [facility] laboratory (McKanna et al 2018), when not expected to be low based on maternal weight or gestational age, identified an increased risk for underlying fetal triploidy, trisomy 43, or trisomy 82, with a positive predictive value of approximately 1 in 62.   Nuchal translucency ultrasound was attempted today, and NT was unable to be obtained due to fetal position. First trimester screen is not able to be performed. Complete ultrasound report under separate cover.   We reviewed additional available screening options including Quad screen and detailed ultrasound. We also reviewed the option of repeat NIPS at a later gestational age and/or through a different laboratory, though discussed the possibility of not obtaining a result due to low fetal fraction. She was counseled that screening tests are used to modify a patient's a priori risk for aneuploidy, typically based on age. This estimate provides a pregnancy specific risk assessment. We reviewed the benefits and limitations of each option. Specifically, we discussed the conditions for which each test screens, the detection rates, and false positive rates of each. She was also counseled regarding diagnostic testing via CVS or amniocentesis. We reviewed the approximate 1 in 300-500 risk for complications from amniocentesis, including spontaneous pregnancy loss. We discussed the possible results that the tests might provide including: positive, negative, unanticipated, and no result. Finally, they were counseled regarding the cost of each option and potential out of pocket expenses.    After consideration of all the options, Mrs. Parilee Hally elected  to pursue ultrasound only, and declined Quad screen, repeat NIPS, CVS and amniocentesis. Detailed ultrasound is scheduled for 02/10/18. She understands that screening tests cannot rule out all birth defects or genetic syndromes. The patient was advised of this limitation and states she still does not want additional testing at this time.   The patient reported no new updates to the family or pregnancy history at this time. I counseled Mrs. Pleas KochAmanda Labus regarding the above risks and available options.  Most of the counseling was provided by Lynnette CaffeyEvelyn Cabon, [employer] genetic counseling student, under my direct supervision.  The approximate face-to-face time with the genetic counselor was 25 minutes.    Quinn PlowmanKaren Burnard Enis, MS Certified Genetic Counselor 01/07/2018

## 2018-01-13 ENCOUNTER — Ambulatory Visit (INDEPENDENT_AMBULATORY_CARE_PROVIDER_SITE_OTHER): Payer: PRIVATE HEALTH INSURANCE | Admitting: Family Medicine

## 2018-01-13 VITALS — BP 122/77 | HR 99 | Wt 217.0 lb

## 2018-01-13 DIAGNOSIS — E282 Polycystic ovarian syndrome: Secondary | ICD-10-CM | POA: Insufficient documentation

## 2018-01-13 DIAGNOSIS — O10919 Unspecified pre-existing hypertension complicating pregnancy, unspecified trimester: Secondary | ICD-10-CM

## 2018-01-13 DIAGNOSIS — K219 Gastro-esophageal reflux disease without esophagitis: Secondary | ICD-10-CM | POA: Insufficient documentation

## 2018-01-13 DIAGNOSIS — O24119 Pre-existing diabetes mellitus, type 2, in pregnancy, unspecified trimester: Secondary | ICD-10-CM

## 2018-01-13 DIAGNOSIS — L409 Psoriasis, unspecified: Secondary | ICD-10-CM | POA: Insufficient documentation

## 2018-01-13 DIAGNOSIS — O0990 Supervision of high risk pregnancy, unspecified, unspecified trimester: Secondary | ICD-10-CM

## 2018-01-13 DIAGNOSIS — O99280 Endocrine, nutritional and metabolic diseases complicating pregnancy, unspecified trimester: Secondary | ICD-10-CM

## 2018-01-13 DIAGNOSIS — D6861 Antiphospholipid syndrome: Secondary | ICD-10-CM

## 2018-01-13 DIAGNOSIS — O09522 Supervision of elderly multigravida, second trimester: Secondary | ICD-10-CM

## 2018-01-13 DIAGNOSIS — E039 Hypothyroidism, unspecified: Secondary | ICD-10-CM

## 2018-01-13 MED ORDER — ENOXAPARIN SODIUM 40 MG/0.4ML ~~LOC~~ SOLN: 40.0000 mg | SUBCUTANEOUS | 6 refills | Status: DC

## 2018-01-13 MED ORDER — INSULIN NPH (HUMAN) (ISOPHANE) 100 UNIT/ML ~~LOC~~ SUSP: 12.0000 [IU] | Freq: Every day | SUBCUTANEOUS | 11 refills | Status: DC

## 2018-01-13 NOTE — Progress Notes (Signed)
Pt is being followed by endocrinologist for diabetes

## 2018-01-13 NOTE — Patient Instructions (Signed)
 Second Trimester of Pregnancy The second trimester is from week 14 through week 27 (months 4 through 6). The second trimester is often a time when you feel your best. Your body has adjusted to being pregnant, and you begin to feel better physically. Usually, morning sickness has lessened or quit completely, you may have more energy, and you may have an increase in appetite. The second trimester is also a time when the fetus is growing rapidly. At the end of the sixth month, the fetus is about 9 inches long and weighs about 1 pounds. You will likely begin to feel the baby move (quickening) between 16 and 20 weeks of pregnancy. Body changes during your second trimester Your body continues to go through many changes during your second trimester. The changes vary from woman to woman.  Your weight will continue to increase. You will notice your lower abdomen bulging out.  You may begin to get stretch marks on your hips, abdomen, and breasts.  You may develop headaches that can be relieved by medicines. The medicines should be approved by your health care provider.  You may urinate more often because the fetus is pressing on your bladder.  You may develop or continue to have heartburn as a result of your pregnancy.  You may develop constipation because certain hormones are causing the muscles that push waste through your intestines to slow down.  You may develop hemorrhoids or swollen, bulging veins (varicose veins).  You may have back pain. This is caused by: ? Weight gain. ? Pregnancy hormones that are relaxing the joints in your pelvis. ? A shift in weight and the muscles that support your balance.  Your breasts will continue to grow and they will continue to become tender.  Your gums may bleed and may be sensitive to brushing and flossing.  Dark spots or blotches (chloasma, mask of pregnancy) may develop on your face. This will likely fade after the baby is born.  A dark line from  your belly button to the pubic area (linea nigra) may appear. This will likely fade after the baby is born.  You may have changes in your hair. These can include thickening of your hair, rapid growth, and changes in texture. Some women also have hair loss during or after pregnancy, or hair that feels dry or thin. Your hair will most likely return to normal after your baby is born.  What to expect at prenatal visits During a routine prenatal visit:  You will be weighed to make sure you and the fetus are growing normally.  Your blood pressure will be taken.  Your abdomen will be measured to track your baby's growth.  The fetal heartbeat will be listened to.  Any test results from the previous visit will be discussed.  Your health care provider may ask you:  How you are feeling.  If you are feeling the baby move.  If you have had any abnormal symptoms, such as leaking fluid, bleeding, severe headaches, or abdominal cramping.  If you are using any tobacco products, including cigarettes, chewing tobacco, and electronic cigarettes.  If you have any questions.  Other tests that may be performed during your second trimester include:  Blood tests that check for: ? Low iron levels (anemia). ? High blood sugar that affects pregnant women (gestational diabetes) between 24 and 28 weeks. ? Rh antibodies. This is to check for a protein on red blood cells (Rh factor).  Urine tests to check for infections, diabetes,   or protein in the urine.  An ultrasound to confirm the proper growth and development of the baby.  An amniocentesis to check for possible genetic problems.  Fetal screens for spina bifida and Down syndrome.  HIV (human immunodeficiency virus) testing. Routine prenatal testing includes screening for HIV, unless you choose not to have this test.  Follow these instructions at home: Medicines  Follow your health care provider's instructions regarding medicine use. Specific  medicines may be either safe or unsafe to take during pregnancy.  Take a prenatal vitamin that contains at least 600 micrograms (mcg) of folic acid.  If you develop constipation, try taking a stool softener if your health care provider approves. Eating and drinking  Eat a balanced diet that includes fresh fruits and vegetables, whole grains, good sources of protein such as meat, eggs, or tofu, and low-fat dairy. Your health care provider will help you determine the amount of weight gain that is right for you.  Avoid raw meat and uncooked cheese. These carry germs that can cause birth defects in the baby.  If you have low calcium intake from food, talk to your health care provider about whether you should take a daily calcium supplement.  Limit foods that are high in fat and processed sugars, such as fried and sweet foods.  To prevent constipation: ? Drink enough fluid to keep your urine clear or pale yellow. ? Eat foods that are high in fiber, such as fresh fruits and vegetables, whole grains, and beans. Activity  Exercise only as directed by your health care provider. Most women can continue their usual exercise routine during pregnancy. Try to exercise for 30 minutes at least 5 days a week. Stop exercising if you experience uterine contractions.  Avoid heavy lifting, wear low heel shoes, and practice good posture.  A sexual relationship may be continued unless your health care provider directs you otherwise. Relieving pain and discomfort  Wear a good support bra to prevent discomfort from breast tenderness.  Take warm sitz baths to soothe any pain or discomfort caused by hemorrhoids. Use hemorrhoid cream if your health care provider approves.  Rest with your legs elevated if you have leg cramps or low back pain.  If you develop varicose veins, wear support hose. Elevate your feet for 15 minutes, 3-4 times a day. Limit salt in your diet. Prenatal Care  Write down your questions.  Take them to your prenatal visits.  Keep all your prenatal visits as told by your health care provider. This is important. Safety  Wear your seat belt at all times when driving.  Make a list of emergency phone numbers, including numbers for family, friends, the hospital, and police and fire departments. General instructions  Ask your health care provider for a referral to a local prenatal education class. Begin classes no later than the beginning of month 6 of your pregnancy.  Ask for help if you have counseling or nutritional needs during pregnancy. Your health care provider can offer advice or refer you to specialists for help with various needs.  Do not use hot tubs, steam rooms, or saunas.  Do not douche or use tampons or scented sanitary pads.  Do not cross your legs for long periods of time.  Avoid cat litter boxes and soil used by cats. These carry germs that can cause birth defects in the baby and possibly loss of the fetus by miscarriage or stillbirth.  Avoid all smoking, herbs, alcohol, and unprescribed drugs. Chemicals in these products   can affect the formation and growth of the baby.  Do not use any products that contain nicotine or tobacco, such as cigarettes and e-cigarettes. If you need help quitting, ask your health care provider.  Visit your dentist if you have not gone yet during your pregnancy. Use a soft toothbrush to brush your teeth and be gentle when you floss. Contact a health care provider if:  You have dizziness.  You have mild pelvic cramps, pelvic pressure, or nagging pain in the abdominal area.  You have persistent nausea, vomiting, or diarrhea.  You have a bad smelling vaginal discharge.  You have pain when you urinate. Get help right away if:  You have a fever.  You are leaking fluid from your vagina.  You have spotting or bleeding from your vagina.  You have severe abdominal cramping or pain.  You have rapid weight gain or weight  loss.  You have shortness of breath with chest pain.  You notice sudden or extreme swelling of your face, hands, ankles, feet, or legs.  You have not felt your baby move in over an hour.  You have severe headaches that do not go away when you take medicine.  You have vision changes. Summary  The second trimester is from week 14 through week 27 (months 4 through 6). It is also a time when the fetus is growing rapidly.  Your body goes through many changes during pregnancy. The changes vary from woman to woman.  Avoid all smoking, herbs, alcohol, and unprescribed drugs. These chemicals affect the formation and growth your baby.  Do not use any tobacco products, such as cigarettes, chewing tobacco, and e-cigarettes. If you need help quitting, ask your health care provider.  Contact your health care provider if you have any questions. Keep all prenatal visits as told by your health care provider. This is important. This information is not intended to replace advice given to you by your health care provider. Make sure you discuss any questions you have with your health care provider. Document Released: 10/06/2001 Document Revised: 11/17/2016 Document Reviewed: 11/17/2016 [employer] Interactive Patient Education  2018 [employer]   Breastfeeding Choosing to breastfeed is one of the best decisions you can make for yourself and your baby. A change in hormones during pregnancy causes your breasts to make breast milk in your milk-producing glands. Hormones prevent breast milk from being released before your baby is born. They also prompt milk flow after birth. Once breastfeeding has begun, thoughts of your baby, as well as his or her sucking or crying, can stimulate the release of milk from your milk-producing glands. Benefits of breastfeeding Research shows that breastfeeding offers many health benefits for infants and mothers. It also offers a cost-free and convenient way to feed your  baby. For your baby  Your first milk (colostrum) helps your baby's digestive system to function better.  Special cells in your milk (antibodies) help your baby to fight off infections.  Breastfed babies are less likely to develop asthma, allergies, obesity, or type 2 diabetes. They are also at lower risk for sudden infant death syndrome (SIDS).  Nutrients in breast milk are better able to meet your baby's needs compared to infant formula.  Breast milk improves your baby's brain development. For you  Breastfeeding helps to create a very special bond between you and your baby.  Breastfeeding is convenient. Breast milk costs nothing and is always available at the correct temperature.  Breastfeeding helps to burn calories. It helps you   to lose the weight that you gained during pregnancy.  Breastfeeding makes your uterus return faster to its size before pregnancy. It also slows bleeding (lochia) after you give birth.  Breastfeeding helps to lower your risk of developing type 2 diabetes, osteoporosis, rheumatoid arthritis, cardiovascular disease, and breast, ovarian, uterine, and endometrial cancer later in life. Breastfeeding basics Starting breastfeeding  Find a comfortable place to sit or lie down, with your neck and back well-supported.  Place a pillow or a rolled-up blanket under your baby to bring him or her to the level of your breast (if you are seated). Nursing pillows are specially designed to help support your arms and your baby while you breastfeed.  Make sure that your baby's tummy (abdomen) is facing your abdomen.  Gently massage your breast. With your fingertips, massage from the outer edges of your breast inward toward the nipple. This encourages milk flow. If your milk flows slowly, you may need to continue this action during the feeding.  Support your breast with 4 fingers underneath and your thumb above your nipple (make the letter "C" with your hand). Make sure your  fingers are well away from your nipple and your baby's mouth.  Stroke your baby's lips gently with your finger or nipple.  When your baby's mouth is open wide enough, quickly bring your baby to your breast, placing your entire nipple and as much of the areola as possible into your baby's mouth. The areola is the colored area around your nipple. ? More areola should be visible above your baby's upper lip than below the lower lip. ? Your baby's lips should be opened and extended outward (flanged) to ensure an adequate, comfortable latch. ? Your baby's tongue should be between his or her lower gum and your breast.  Make sure that your baby's mouth is correctly positioned around your nipple (latched). Your baby's lips should create a seal on your breast and be turned out (everted).  It is common for your baby to suck about 2-3 minutes in order to start the flow of breast milk. Latching Teaching your baby how to latch onto your breast properly is very important. An improper latch can cause nipple pain, decreased milk supply, and poor weight gain in your baby. Also, if your baby is not latched onto your nipple properly, he or she may swallow some air during feeding. This can make your baby fussy. Burping your baby when you switch breasts during the feeding can help to get rid of the air. However, teaching your baby to latch on properly is still the best way to prevent fussiness from swallowing air while breastfeeding. Signs that your baby has successfully latched onto your nipple  Silent tugging or silent sucking, without causing you pain. Infant's lips should be extended outward (flanged).  Swallowing heard between every 3-4 sucks once your milk has started to flow (after your let-down milk reflex occurs).  Muscle movement above and in front of his or her ears while sucking.  Signs that your baby has not successfully latched onto your nipple  Sucking sounds or smacking sounds from your baby while  breastfeeding.  Nipple pain.  If you think your baby has not latched on correctly, slip your finger into the corner of your baby's mouth to break the suction and place it between your baby's gums. Attempt to start breastfeeding again. Signs of successful breastfeeding Signs from your baby  Your baby will gradually decrease the number of sucks or will completely stop sucking.    Your baby will fall asleep.  Your baby's body will relax.  Your baby will retain a small amount of milk in his or her mouth.  Your baby will let go of your breast by himself or herself.  Signs from you  Breasts that have increased in firmness, weight, and size 1-3 hours after feeding.  Breasts that are softer immediately after breastfeeding.  Increased milk volume, as well as a change in milk consistency and color by the fifth day of breastfeeding.  Nipples that are not sore, cracked, or bleeding.  Signs that your baby is getting enough milk  Wetting at least 1-2 diapers during the first 24 hours after birth.  Wetting at least 5-6 diapers every 24 hours for the first week after birth. The urine should be clear or pale yellow by the age of 5 days.  Wetting 6-8 diapers every 24 hours as your baby continues to grow and develop.  At least 3 stools in a 24-hour period by the age of 5 days. The stool should be soft and yellow.  At least 3 stools in a 24-hour period by the age of 7 days. The stool should be seedy and yellow.  No loss of weight greater than 10% of birth weight during the first 3 days of life.  Average weight gain of 4-7 oz (113-198 g) per week after the age of 4 days.  Consistent daily weight gain by the age of 5 days, without weight loss after the age of 2 weeks. After a feeding, your baby may spit up a small amount of milk. This is normal. Breastfeeding frequency and duration Frequent feeding will help you make more milk and can prevent sore nipples and extremely full breasts (breast  engorgement). Breastfeed when you feel the need to reduce the fullness of your breasts or when your baby shows signs of hunger. This is called "breastfeeding on demand." Signs that your baby is hungry include:  Increased alertness, activity, or restlessness.  Movement of the head from side to side.  Opening of the mouth when the corner of the mouth or cheek is stroked (rooting).  Increased sucking sounds, smacking lips, cooing, sighing, or squeaking.  Hand-to-mouth movements and sucking on fingers or hands.  Fussing or crying.  Avoid introducing a pacifier to your baby in the first 4-6 weeks after your baby is born. After this time, you may choose to use a pacifier. Research has shown that pacifier use during the first year of a baby's life decreases the risk of sudden infant death syndrome (SIDS). Allow your baby to feed on each breast as long as he or she wants. When your baby unlatches or falls asleep while feeding from the first breast, offer the second breast. Because newborns are often sleepy in the first few weeks of life, you may need to awaken your baby to get him or her to feed. Breastfeeding times will vary from baby to baby. However, the following rules can serve as a guide to help you make sure that your baby is properly fed:  Newborns (babies 4 weeks of age or younger) may breastfeed every 1-3 hours.  Newborns should not go without breastfeeding for longer than 3 hours during the day or 5 hours during the night.  You should breastfeed your baby a minimum of 8 times in a 24-hour period.  Breast milk pumping Pumping and storing breast milk allows you to make sure that your baby is exclusively fed your breast milk, even at times   when you are unable to breastfeed. This is especially important if you go back to work while you are still breastfeeding, or if you are not able to be present during feedings. Your lactation consultant can help you find a method of pumping that works best  for you and give you guidelines about how long it is safe to store breast milk. Caring for your breasts while you breastfeed Nipples can become dry, cracked, and sore while breastfeeding. The following recommendations can help keep your breasts moisturized and healthy:  Avoid using soap on your nipples.  Wear a supportive bra designed especially for nursing. Avoid wearing underwire-style bras or extremely tight bras (sports bras).  Air-dry your nipples for 3-4 minutes after each feeding.  Use only cotton bra pads to absorb leaked breast milk. Leaking of breast milk between feedings is normal.  Use lanolin on your nipples after breastfeeding. Lanolin helps to maintain your skin's normal moisture barrier. Pure lanolin is not harmful (not toxic) to your baby. You may also hand express a few drops of breast milk and gently massage that milk into your nipples and allow the milk to air-dry.  In the first few weeks after giving birth, some women experience breast engorgement. Engorgement can make your breasts feel heavy, warm, and tender to the touch. Engorgement peaks within 3-5 days after you give birth. The following recommendations can help to ease engorgement:  Completely empty your breasts while breastfeeding or pumping. You may want to start by applying warm, moist heat (in the shower or with warm, water-soaked hand towels) just before feeding or pumping. This increases circulation and helps the milk flow. If your baby does not completely empty your breasts while breastfeeding, pump any extra milk after he or she is finished.  Apply ice packs to your breasts immediately after breastfeeding or pumping, unless this is too uncomfortable for you. To do this: ? Put ice in a plastic bag. ? Place a towel between your skin and the bag. ? Leave the ice on for 20 minutes, 2-3 times a day.  Make sure that your baby is latched on and positioned properly while breastfeeding.  If engorgement persists  after 48 hours of following these recommendations, contact your health care provider or a lactation consultant. Overall health care recommendations while breastfeeding  Eat 3 healthy meals and 3 snacks every day. Well-nourished mothers who are breastfeeding need an additional 450-500 calories a day. You can meet this requirement by increasing the amount of a balanced diet that you eat.  Drink enough water to keep your urine pale yellow or clear.  Rest often, relax, and continue to take your prenatal vitamins to prevent fatigue, stress, and low vitamin and mineral levels in your body (nutrient deficiencies).  Do not use any products that contain nicotine or tobacco, such as cigarettes and e-cigarettes. Your baby may be harmed by chemicals from cigarettes that pass into breast milk and exposure to secondhand smoke. If you need help quitting, ask your health care provider.  Avoid alcohol.  Do not use illegal drugs or marijuana.  Talk with your health care provider before taking any medicines. These include over-the-counter and prescription medicines as well as vitamins and herbal supplements. Some medicines that may be harmful to your baby can pass through breast milk.  It is possible to become pregnant while breastfeeding. If birth control is desired, ask your health care provider about options that will be safe while breastfeeding your baby. Where to find more information: [employer]: www.llli.org Contact a health care provider if:  You feel like you want to stop breastfeeding or have become frustrated with breastfeeding.  Your nipples are cracked or bleeding.  Your breasts are red, tender, or warm.  You have: ? Painful breasts or nipples. ? A swollen area on either breast. ? A fever or chills. ? Nausea or vomiting. ? Drainage other than breast milk from your nipples.  Your breasts do not become full before feedings by the fifth day after you give birth.  You  feel sad and depressed.  Your baby is: ? Too sleepy to eat well. ? Having trouble sleeping. ? More than 1 week old and wetting fewer than 6 diapers in a 24-hour period. ? Not gaining weight by 5 days of age.  Your baby has fewer than 3 stools in a 24-hour period.  Your baby's skin or the white parts of his or her eyes become yellow. Get help right away if:  Your baby is overly tired (lethargic) and does not want to wake up and feed.  Your baby develops an unexplained fever. Summary  Breastfeeding offers many health benefits for infant and mothers.  Try to breastfeed your infant when he or she shows early signs of hunger.  Gently tickle or stroke your baby's lips with your finger or nipple to allow the baby to open his or her mouth. Bring the baby to your breast. Make sure that much of the areola is in your baby's mouth. Offer one side and burp the baby before you offer the other side.  Talk with your health care provider or lactation consultant if you have questions or you face problems as you breastfeed. This information is not intended to replace advice given to you by your health care provider. Make sure you discuss any questions you have with your health care provider. Document Released: 10/12/2005 Document Revised: 11/13/2016 Document Reviewed: 11/13/2016 [employer] Interactive Patient Education  2018 [employer]  

## 2018-01-14 ENCOUNTER — Encounter: Payer: Self-pay | Admitting: Family Medicine

## 2018-01-14 NOTE — Progress Notes (Signed)
   PRENATAL VISIT NOTE  Subjective:  [name] is a 37 y.o. G4P0030 at 3272w4d being seen today for ongoing prenatal care.  She is currently monitored for the following issues for this high-risk pregnancy and has Supervision of high risk pregnancy, antepartum; Type 2 diabetes mellitus affecting pregnancy, antepartum; Hypothyroidism affecting pregnancy, antepartum; Advanced maternal age in multigravida; Obesity in pregnancy; Chronic hypertension in pregnancy; Abnormal antenatal test; PCOS (polycystic ovarian syndrome); GERD (gastroesophageal reflux disease); Psoriasis; Recurrent pregnancy loss; and Antiphospholipid antibody syndrome (HCC) on their problem list.  Patient reports no complaints.  Contractions: Regular. Vag. Bleeding: None.  Movement: Absent. Denies leaking of fluid.   The following portions of the patient's history were reviewed and updated as appropriate: allergies, current medications, past family history, past medical history, past social history, past surgical history and problem list. Problem list updated.  Objective:   Vitals:   01/13/18 1331  BP: 122/77  Pulse: 99  Weight: 217 lb (98.4 kg)    Fetal Status: Fetal Heart Rate (bpm): 149   Movement: Absent     General:  Alert, oriented and cooperative. Patient is in no acute distress.  Skin: Skin is warm and dry. No rash noted.   Cardiovascular: Normal heart rate noted  Respiratory: Normal respiratory effort, no problems with respiration noted  Abdomen: Soft, gravid, appropriate for gestational age.  Pain/Pressure: Absent     Pelvic: Cervical exam deferred        Extremities: Normal range of motion.  Edema: None  Mental Status:  Normal mood and affect. Normal behavior. Normal judgment and thought content.   Assessment and Plan:  Pregnancy: G4P0030 at 4672w4d  1. Supervision of high risk pregnancy, antepartum Has anatomy u/s ordered  2. Chronic hypertension in pregnancy Continue Labetalol and ASA  3. Type 2  diabetes mellitus affecting pregnancy, antepartum Seeing Endocrinology--discussed goals of FBS < 95 and 2 hour pp < 120. Reports CBGs are 110 fasting, will increase hs NPH. May increase as needed to control CBGs. Add daytime NPH, basal and meal coverage depending on glycemic control. - insulin NPH Human (HUMULIN N) 100 UNIT/ML injection; Inject 0.12 mLs (12 Units total) into the skin at bedtime.  Dispense: 10 mL; Refill: 11  4. Hypothyroidism affecting pregnancy, antepartum Nml TSH in first trimester, will need minimum of 1/trimester with adjustments as needed based on results.  5. Elderly multigravida in second trimester Has low fetal fraction on NIPS--did not want more invasive testing.  6. Antiphospholipid antibody syndrome (HCC) Change to lovenox--better tolerated and easier to dose and undo in pegnancy - enoxaparin (LOVENOX) 40 MG/0.4ML injection; Inject 0.4 mLs (40 mg total) into the skin daily.  Dispense: 30 Syringe; Refill: 6  General obstetric precautions including but not limited to vaginal bleeding, contractions, leaking of fluid and fetal movement were reviewed in detail with the patient. Please refer to After Visit Summary for other counseling recommendations.  Return in 1 month (on 02/10/2018).   Reva Boresanya S Ruthellen Tippy, MD

## 2018-01-18 ENCOUNTER — Encounter: Payer: Self-pay | Admitting: *Deleted

## 2018-01-27 ENCOUNTER — Ambulatory Visit (INDEPENDENT_AMBULATORY_CARE_PROVIDER_SITE_OTHER): Payer: PRIVATE HEALTH INSURANCE | Admitting: Obstetrics & Gynecology

## 2018-01-27 VITALS — BP 116/72 | HR 94 | Wt 216.0 lb

## 2018-01-27 DIAGNOSIS — D6861 Antiphospholipid syndrome: Secondary | ICD-10-CM

## 2018-01-27 DIAGNOSIS — O0992 Supervision of high risk pregnancy, unspecified, second trimester: Secondary | ICD-10-CM

## 2018-01-27 DIAGNOSIS — O99212 Obesity complicating pregnancy, second trimester: Secondary | ICD-10-CM

## 2018-01-27 DIAGNOSIS — O09522 Supervision of elderly multigravida, second trimester: Secondary | ICD-10-CM

## 2018-01-27 DIAGNOSIS — O10912 Unspecified pre-existing hypertension complicating pregnancy, second trimester: Secondary | ICD-10-CM

## 2018-01-27 DIAGNOSIS — O24112 Pre-existing diabetes mellitus, type 2, in pregnancy, second trimester: Secondary | ICD-10-CM

## 2018-01-27 DIAGNOSIS — O24119 Pre-existing diabetes mellitus, type 2, in pregnancy, unspecified trimester: Secondary | ICD-10-CM

## 2018-01-27 DIAGNOSIS — O10919 Unspecified pre-existing hypertension complicating pregnancy, unspecified trimester: Secondary | ICD-10-CM

## 2018-01-27 DIAGNOSIS — E039 Hypothyroidism, unspecified: Secondary | ICD-10-CM

## 2018-01-27 DIAGNOSIS — O99210 Obesity complicating pregnancy, unspecified trimester: Secondary | ICD-10-CM

## 2018-01-27 DIAGNOSIS — O0990 Supervision of high risk pregnancy, unspecified, unspecified trimester: Secondary | ICD-10-CM

## 2018-01-27 DIAGNOSIS — O99280 Endocrine, nutritional and metabolic diseases complicating pregnancy, unspecified trimester: Secondary | ICD-10-CM

## 2018-01-27 DIAGNOSIS — O99282 Endocrine, nutritional and metabolic diseases complicating pregnancy, second trimester: Secondary | ICD-10-CM

## 2018-01-27 NOTE — Progress Notes (Signed)
   PRENATAL VISIT NOTE  Subjective:  Kelli Miller is a 37 y.o. G4P0030 at 5580w3d being seen today for ongoing prenatal care.  She is currently monitored for the following issues for this high-risk pregnancy and has Supervision of high risk pregnancy, antepartum; Type 2 diabetes mellitus affecting pregnancy, antepartum; Hypothyroidism affecting pregnancy, antepartum; Advanced maternal age in multigravida; Obesity in pregnancy; Chronic hypertension in pregnancy; Abnormal antenatal test; PCOS (polycystic ovarian syndrome); GERD (gastroesophageal reflux disease); Psoriasis; Recurrent pregnancy loss; and Antiphospholipid antibody syndrome (HCC) on their problem list.  Patient reports no complaints.  Contractions: Not present. Vag. Bleeding: None.  Movement: Increased. Denies leaking of fluid.   The following portions of the patient's history were reviewed and updated as appropriate: allergies, current medications, past family history, past medical history, past social history, past surgical history and problem list. Problem list updated.  Objective:   Vitals:   01/27/18 1448  BP: 116/72  Pulse: 94  Weight: 216 lb (98 kg)    Fetal Status: Fetal Heart Rate (bpm): 147   Movement: Increased     General:  Alert, oriented and cooperative. Patient is in no acute distress.  Skin: Skin is warm and dry. No rash noted.   Cardiovascular: Normal heart rate noted  Respiratory: Normal respiratory effort, no problems with respiration noted  Abdomen: Soft, gravid, appropriate for gestational age.  Pain/Pressure: Absent     Pelvic: Cervical exam deferred        Extremities: Normal range of motion.  Edema: None  Mental Status: Normal mood and affect. Normal behavior. Normal judgment and thought content.   Assessment and Plan:  Pregnancy: G4P0030 at 5080w3d  1. Multigravida of advanced maternal age in second trimester - inadequate cells for NIPS, had first screen, will get AFP today  2. Antiphospholipid  antibody syndrome (HCC) - on lovenox  3. Chronic hypertension in pregnancy - on asa daily  4. Hypothyroidism affecting pregnancy, antepartum - check TSH today and q trimester - TSH  5. Obesity in pregnancy - she has an appt with nutritionist - I rec'd 12 pound weight gain   6. Supervision of high risk pregnancy, antepartum - She has MFM u/s scheduled  7. Type 2 diabetes mellitus affecting pregnancy, antepartum She had been instructed to increase her NPH by 2 units QOD until she reaches the target of fastings less than 90  Preterm labor symptoms and general obstetric precautions including but not limited to vaginal bleeding, contractions, leaking of fluid and fetal movement were reviewed in detail with the patient. Please refer to After Visit Summary for other counseling recommendations.  No follow-ups on file.  Future Appointments  Date Time Provider Department Center  02/01/2018  2:00 PM Carolan ShiverJohnston, Angela D, RD [facility]  02/10/2018  1:30 PM [facility] US 1 [facility]    Allie BossierMyra C Luann Aspinwall, MD

## 2018-01-28 LAB — ALPHA FETOPROTEIN, MATERNAL
AFP MoM: 0.75
AFP, SERUM: 20.6 ng/mL
CALC'D GESTATIONAL AGE: 16.4 wk
Maternal Wt: 216 [lb_av]
Risk for ONTD: <1
TWINS-AFP: 1

## 2018-01-28 LAB — TSH: TSH: 1.65 mIU/L

## 2018-01-31 ENCOUNTER — Encounter: Payer: Self-pay | Admitting: Obstetrics & Gynecology

## 2018-02-01 ENCOUNTER — Encounter: Payer: PRIVATE HEALTH INSURANCE | Attending: Obstetrics and Gynecology | Admitting: Registered"

## 2018-02-01 ENCOUNTER — Encounter: Payer: Self-pay | Admitting: Registered"

## 2018-02-01 DIAGNOSIS — Z713 Dietary counseling and surveillance: Secondary | ICD-10-CM | POA: Diagnosis present

## 2018-02-01 DIAGNOSIS — O24419 Gestational diabetes mellitus in pregnancy, unspecified control: Secondary | ICD-10-CM | POA: Diagnosis present

## 2018-02-01 DIAGNOSIS — O24119 Pre-existing diabetes mellitus, type 2, in pregnancy, unspecified trimester: Secondary | ICD-10-CM

## 2018-02-01 NOTE — Progress Notes (Signed)
Patient was seen on 02/01/18 for Gestational Diabetes self-management education at the [facility]. The following learning objectives were met by the patient during this course:   States the definition of Gestational Diabetes  States why dietary management is important in controlling blood glucose  Describes the effects each nutrient has on blood glucose levels  Demonstrates ability to create a balanced meal plan  Demonstrates carbohydrate counting   States when to check blood glucose levels  Demonstrates proper blood glucose monitoring techniques  States the effect of stress and exercise on blood glucose levels  States the importance of limiting caffeine and abstaining from alcohol and smoking  Blood glucose monitor given: patient has meter, none give  Patient instructed to monitor glucose levels: FBS: 60 - <95; 1 hour: <140; 2 hour: <120  Patient received handouts:  Nutrition Diabetes and Pregnancy, including carb counting list  Patient will be seen for follow-up as needed.

## 2018-02-08 ENCOUNTER — Other Ambulatory Visit: Payer: Self-pay | Admitting: *Deleted

## 2018-02-08 ENCOUNTER — Encounter: Payer: Self-pay | Admitting: Obstetrics & Gynecology

## 2018-02-08 MED ORDER — TERCONAZOLE 0.4 % VA CREA: 1.0000 | TOPICAL_CREAM | Freq: Every day | VAGINAL | 0 refills | Status: DC

## 2018-02-10 ENCOUNTER — Other Ambulatory Visit (HOSPITAL_COMMUNITY): Payer: Self-pay | Admitting: Obstetrics and Gynecology

## 2018-02-10 ENCOUNTER — Encounter (HOSPITAL_COMMUNITY): Payer: Self-pay

## 2018-02-10 ENCOUNTER — Ambulatory Visit (HOSPITAL_COMMUNITY)
Admission: RE | Admit: 2018-02-10 | Discharge: 2018-02-10 | Disposition: A | Discharge status: Home / Self Care | Payer: PRIVATE HEALTH INSURANCE | Source: Ambulatory Visit | Attending: Obstetrics and Gynecology | Admitting: Obstetrics and Gynecology

## 2018-02-10 DIAGNOSIS — O24119 Pre-existing diabetes mellitus, type 2, in pregnancy, unspecified trimester: Secondary | ICD-10-CM

## 2018-02-10 DIAGNOSIS — O162 Unspecified maternal hypertension, second trimester: Secondary | ICD-10-CM

## 2018-02-10 DIAGNOSIS — Z3A18 18 weeks gestation of pregnancy: Secondary | ICD-10-CM | POA: Insufficient documentation

## 2018-02-10 DIAGNOSIS — O24312 Unspecified pre-existing diabetes mellitus in pregnancy, second trimester: Secondary | ICD-10-CM

## 2018-02-10 DIAGNOSIS — Z3689 Encounter for other specified antenatal screening: Secondary | ICD-10-CM | POA: Diagnosis not present

## 2018-02-10 DIAGNOSIS — E039 Hypothyroidism, unspecified: Secondary | ICD-10-CM

## 2018-02-10 DIAGNOSIS — O09522 Supervision of elderly multigravida, second trimester: Secondary | ICD-10-CM | POA: Diagnosis present

## 2018-02-10 DIAGNOSIS — O99212 Obesity complicating pregnancy, second trimester: Secondary | ICD-10-CM

## 2018-02-10 DIAGNOSIS — O09529 Supervision of elderly multigravida, unspecified trimester: Secondary | ICD-10-CM

## 2018-02-10 DIAGNOSIS — O289 Unspecified abnormal findings on antenatal screening of mother: Secondary | ICD-10-CM

## 2018-02-10 DIAGNOSIS — O0990 Supervision of high risk pregnancy, unspecified, unspecified trimester: Secondary | ICD-10-CM

## 2018-02-10 DIAGNOSIS — O99280 Endocrine, nutritional and metabolic diseases complicating pregnancy, unspecified trimester: Secondary | ICD-10-CM

## 2018-02-10 DIAGNOSIS — O10919 Unspecified pre-existing hypertension complicating pregnancy, unspecified trimester: Secondary | ICD-10-CM

## 2018-02-10 IMAGING — US US MFM OB DETAIL+14 WK
1 series · 14 of 28 positions shown · non-contrast
Comparison: none

[Series 1: us mfm ob detail+14 wk · 105 acquisitions, 14 frames shown]
[im 4/105]
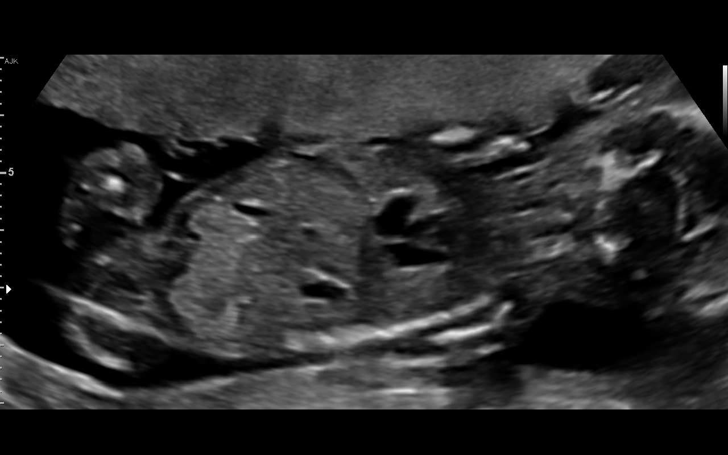
[im 12/105]
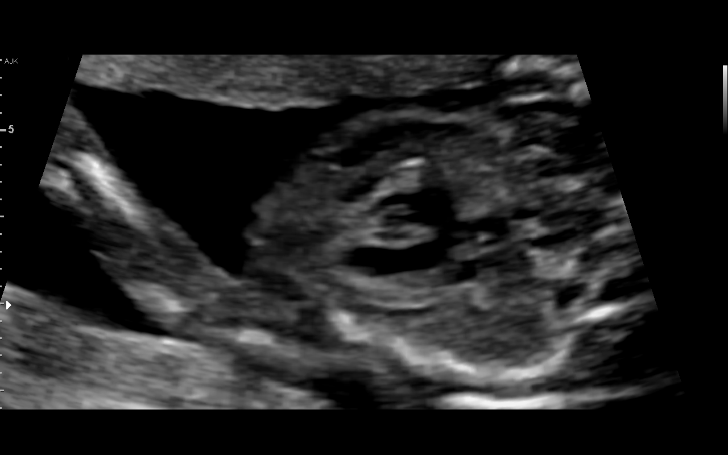
[im 20/105]
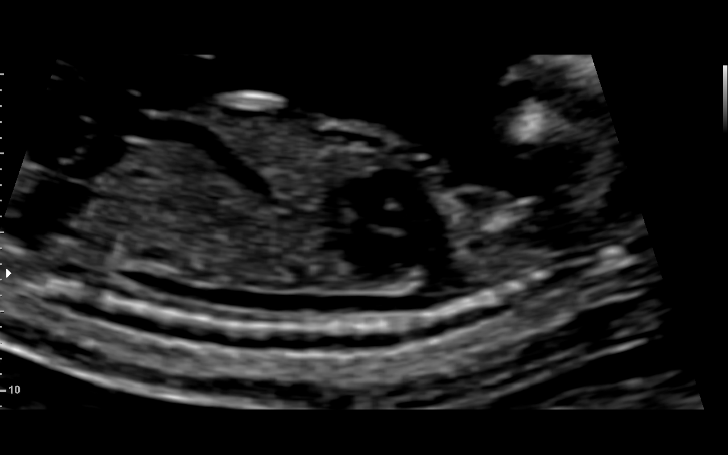
[im 27/105]
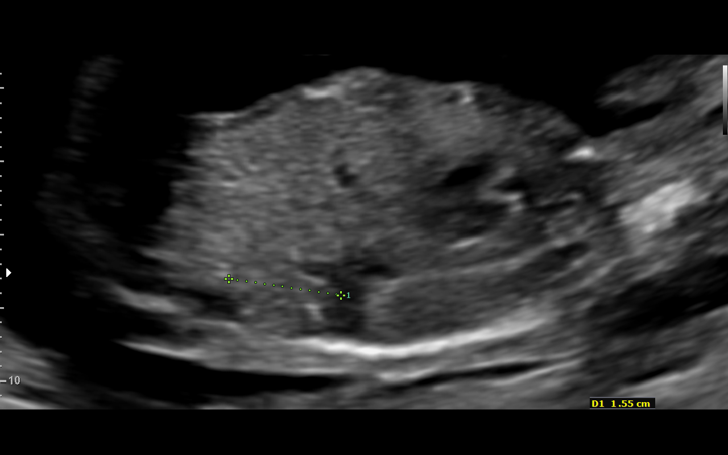
[im 35/105]
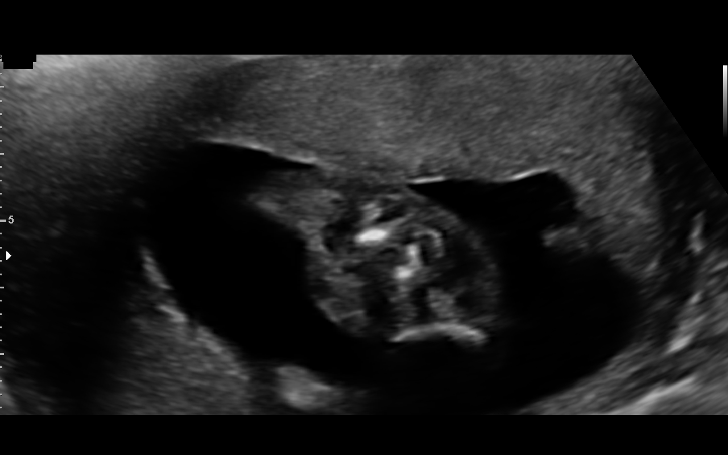
[im 43/105]
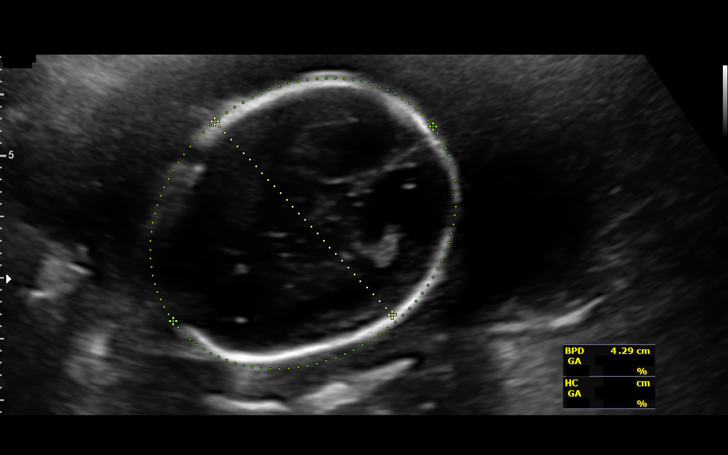
[im 51/105]
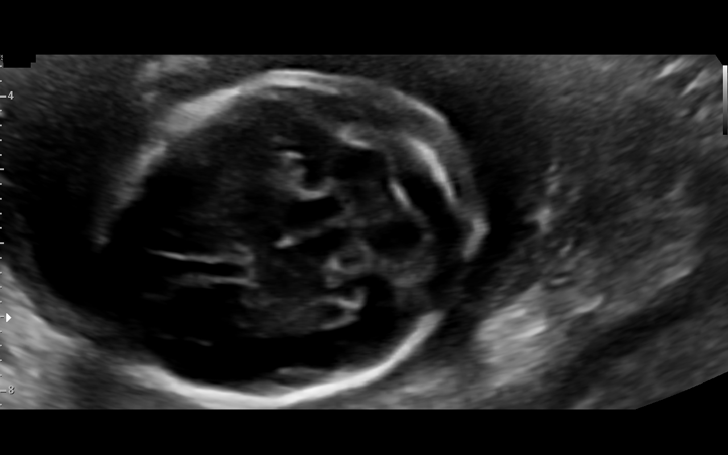
[im 58/105]
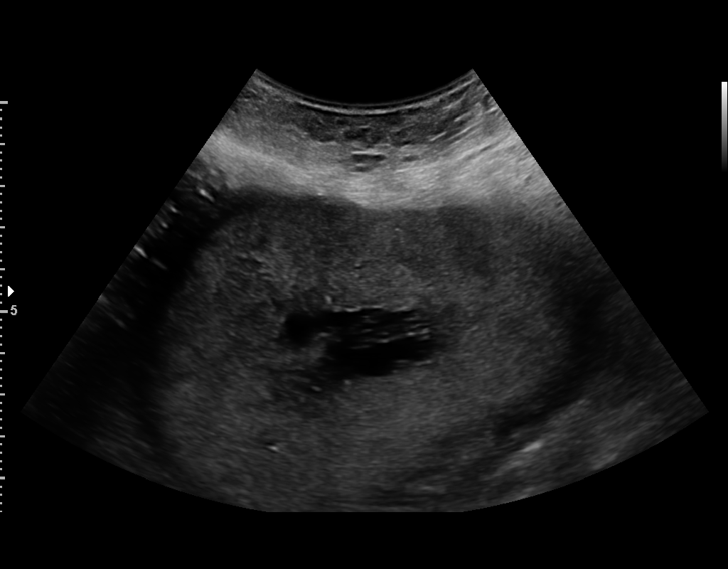
[im 66/105]
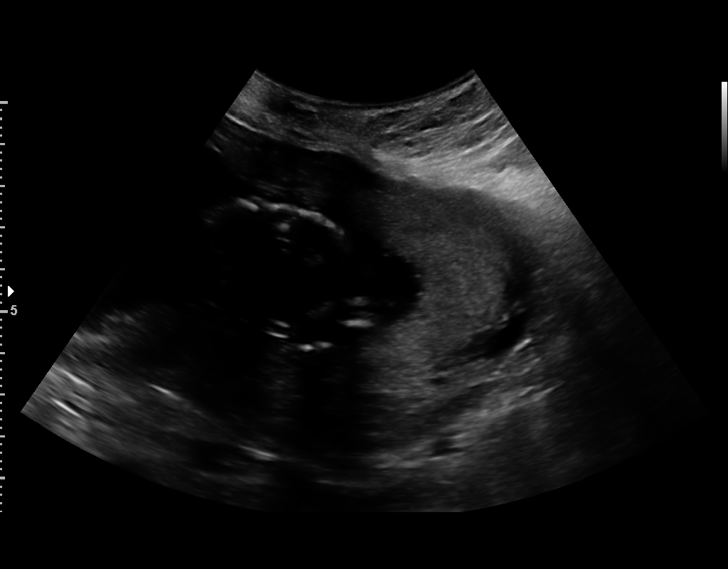
[im 74/105]
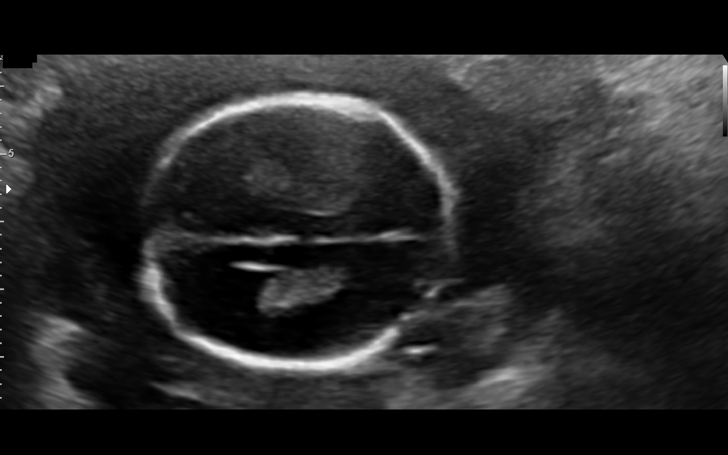
[im 81/105]
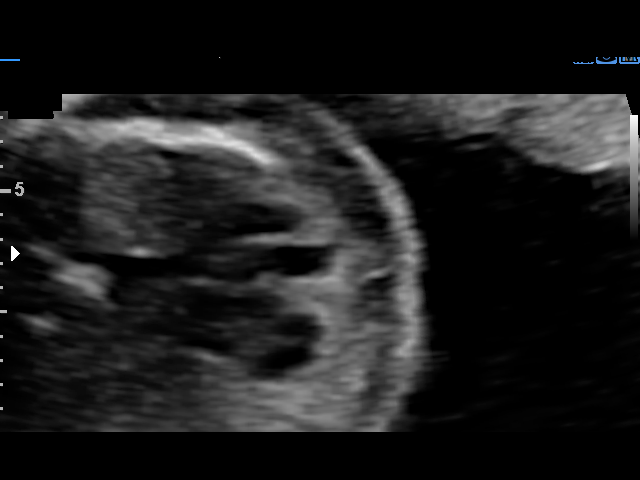
[im 89/105]
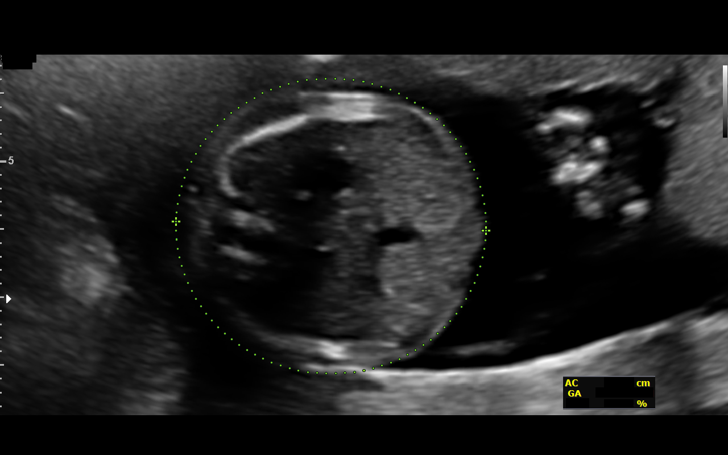
[im 97/105]
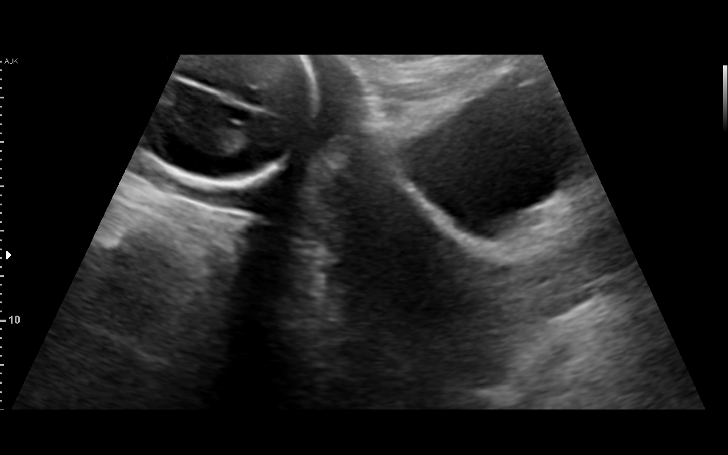
[im 105/105]
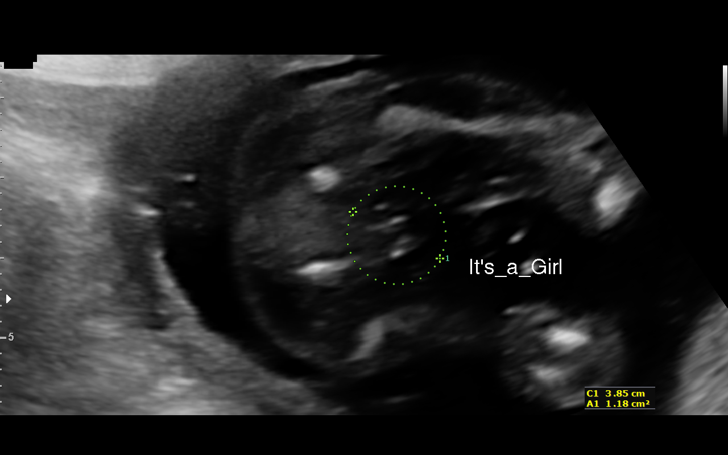

[14 of 28 positions shown; findings below may reference images not displayed]

[facility]

1  KEIDAR              [PHONE_NUMBER]      [PHONE_NUMBER]     [PHONE_NUMBER]
Indications

18 weeks gestation of pregnancy
Diabetes - Pregestational, 2nd trimester       [56]
(metformin and insulin)
Advanced maternal age multigravida 35+,        [56]
second trimester; NIPS low fetal fraction
Obesity complicating pregnancy, second         [56]
trimester
Hypothyroid (synthroid)                        [56] [56]
Hypertension - Chronic/Pre-existing            [56]
(labetalol)
Antiphospholipid syndrome complicating         O99.119, [56]
pregnancy, antepartum (Lovenox)
Encounter for antenatal screening for          [56]
malformations
OB History

Blood Type:            Height:  5'1"   Weight (lb):  212       BMI:
Gravidity:    4          SAB:   3
Living:       0
Fetal Evaluation

Num Of Fetuses:     1
Fetal Heart         154
Rate(bpm):
Cardiac Activity:   Observed
Presentation:       Cephalic
Placenta:           Anterior, above cervical os
P. Cord Insertion:  Visualized, central

Amniotic Fluid
AFI FV:      Subjectively within normal limits

Largest Pocket(cm)
6.85
Biometry

BPD:      41.9  mm     G. Age:  18w 5d         63  %    CI:        76.21   %    70 - 86
FL/HC:       16.2  %    15.8 - 18
HC:      152.1  mm     G. Age:  18w 1d         33  %    HC/AC:       1.10       1.07 -
AC:      138.6  mm     G. Age:  19w 2d         74  %    FL/BPD:      58.9  %
FL:       24.7  mm     G. Age:  17w 3d         14  %    FL/AC:       17.8  %    20 - 24
CER:      19.1  mm     G. Age:  18w 4d         54  %
NFT:       3.9  mm
CM:        4.7  mm

Est. FW:     239   gm     0 lb 8 oz     46  %
Gestational Age

LMP:           18w 3d        Date:  [DATE]                 EDD:   [DATE]
U/S Today:     18w 3d                                        EDD:   [DATE]
Best:          18w 3d     Det. By:  LMP  ([DATE])          EDD:   [DATE]
Anatomy

Cranium:               Appears normal         Aortic Arch:            Appears normal
Cavum:                 Appears normal         Ductal Arch:            Appears normal
Ventricles:            Appears normal         Diaphragm:              Appears normal
Choroid Plexus:        Appears normal         Stomach:                Appears normal, left
sided
Cerebellum:            Appears normal         Abdomen:                Appears normal
Posterior Fossa:       Appears normal         Abdominal Wall:         Appears nml (cord
insert, abd wall)
Nuchal Fold:           Appears normal         Cord Vessels:           Appears normal (3
vessel cord)
Face:                  Appears normal         Kidneys:                Appear normal
(orbits and profile)
Lips:                  Appears normal         Bladder:                Appears normal
Thoracic:              Appears normal         Spine:                  Appears normal
Heart:                 Appears normal         Upper Extremities:      Appears normal
(4CH, axis, and
situs)
RVOT:                  Appears normal         Lower Extremities:      Appears normal
LVOT:                  Appears normal

Other:  Parents do not wish to know sex of fetus. Fetus appears to be a
female. Heels and LT 5th digit visualized. Nasal bone visualized.
Technically difficult due to maternal habitus and fetal position.
Cervix Uterus Adnexa

Cervix
Length:            5.1  cm.
Normal appearance by transabdominal scan.
Uterus
No abnormality visualized.

Left Ovary
Not visualized.

Right Ovary
Not visualized.

Adnexa:       No abnormality visualized.
Impression

SIUP at 18+3 weeks with cardiac activity
Normal detailed fetal anatomy
Markers of aneuploidy: none
Normal amniotic fluid volume
Measurements consistent with LMP dating
Recommendations

Follow-up ultrasound for growth in 4 weeks

## 2018-02-11 ENCOUNTER — Other Ambulatory Visit (HOSPITAL_COMMUNITY): Payer: Self-pay | Admitting: *Deleted

## 2018-02-11 DIAGNOSIS — Z7984 Long term (current) use of oral hypoglycemic drugs: Principal | ICD-10-CM

## 2018-02-11 DIAGNOSIS — O24119 Pre-existing diabetes mellitus, type 2, in pregnancy, unspecified trimester: Secondary | ICD-10-CM

## 2018-02-14 ENCOUNTER — Ambulatory Visit (INDEPENDENT_AMBULATORY_CARE_PROVIDER_SITE_OTHER): Payer: PRIVATE HEALTH INSURANCE | Admitting: Advanced Practice Midwife

## 2018-02-14 ENCOUNTER — Encounter: Payer: Self-pay | Admitting: Advanced Practice Midwife

## 2018-02-14 VITALS — BP 134/77 | HR 96 | Wt 218.0 lb

## 2018-02-14 DIAGNOSIS — O24119 Pre-existing diabetes mellitus, type 2, in pregnancy, unspecified trimester: Secondary | ICD-10-CM

## 2018-02-14 DIAGNOSIS — O0990 Supervision of high risk pregnancy, unspecified, unspecified trimester: Secondary | ICD-10-CM

## 2018-02-14 DIAGNOSIS — G4726 Circadian rhythm sleep disorder, shift work type: Secondary | ICD-10-CM

## 2018-02-14 DIAGNOSIS — O09522 Supervision of elderly multigravida, second trimester: Secondary | ICD-10-CM

## 2018-02-14 MED ORDER — INSULIN NPH (HUMAN) (ISOPHANE) 100 UNIT/ML ~~LOC~~ SUSP: 40.0000 [IU] | Freq: Every day | SUBCUTANEOUS | 5 refills | Status: DC

## 2018-02-14 NOTE — Patient Instructions (Signed)
Second Trimester of Pregnancy The second trimester is from week 13 through week 28, month 4 through 6. This is often the time in pregnancy that you feel your best. Often times, morning sickness has lessened or quit. You may have more energy, and you may get hungry more often. Your unborn baby (fetus) is growing rapidly. At the end of the sixth month, he or she is about 9 inches long and weighs about 1 pounds. You will likely feel the baby move (quickening) between 18 and 20 weeks of pregnancy. Follow these instructions at home:  Avoid all smoking, herbs, and alcohol. Avoid drugs not approved by your doctor.  Do not use any tobacco products, including cigarettes, chewing tobacco, and electronic cigarettes. If you need help quitting, ask your doctor. You may get counseling or other support to help you quit.  Only take medicine as told by your doctor. Some medicines are safe and some are not during pregnancy.  Exercise only as told by your doctor. Stop exercising if you start having cramps.  Eat regular, healthy meals.  Wear a good support bra if your breasts are tender.  Do not use hot tubs, steam rooms, or saunas.  Wear your seat belt when driving.  Avoid raw meat, uncooked cheese, and liter boxes and soil used by cats.  Take your prenatal vitamins.  Take 1500-2000 milligrams of calcium daily starting at the 20th week of pregnancy until you deliver your baby.  Try taking medicine that helps you poop (stool softener) as needed, and if your doctor approves. Eat more fiber by eating fresh fruit, vegetables, and whole grains. Drink enough fluids to keep your pee (urine) clear or pale yellow.  Take warm water baths (sitz baths) to soothe pain or discomfort caused by hemorrhoids. Use hemorrhoid cream if your doctor approves.  If you have puffy, bulging veins (varicose veins), wear support hose. Raise (elevate) your feet for 15 minutes, 3-4 times a day. Limit salt in your diet.  Avoid heavy  lifting, wear low heals, and sit up straight.  Rest with your legs raised if you have leg cramps or low back pain.  Visit your dentist if you have not gone during your pregnancy. Use a soft toothbrush to brush your teeth. Be gentle when you floss.  You can have sex (intercourse) unless your doctor tells you not to.  Go to your doctor visits. Get help if:  You feel dizzy.  You have mild cramps or pressure in your lower belly (abdomen).  You have a nagging pain in your belly area.  You continue to feel sick to your stomach (nauseous), throw up (vomit), or have watery poop (diarrhea).  You have bad smelling fluid coming from your vagina.  You have pain with peeing (urination). Get help right away if:  You have a fever.  You are leaking fluid from your vagina.  You have spotting or bleeding from your vagina.  You have severe belly cramping or pain.  You lose or gain weight rapidly.  You have trouble catching your breath and have chest pain.  You notice sudden or extreme puffiness (swelling) of your face, hands, ankles, feet, or legs.  You have not felt the baby move in over an hour.  You have severe headaches that do not go away with medicine.  You have vision changes. This information is not intended to replace advice given to you by your health care provider. Make sure you discuss any questions you have with your health care   provider. Document Released: 01/06/2010 Document Revised: 03/19/2016 Document Reviewed: 12/13/2012 Elsevier Interactive Patient Education  2017 [employer]  

## 2018-02-14 NOTE — Progress Notes (Signed)
   PRENATAL VISIT NOTE  Subjective:  Kelli Miller is a 37 y.o. G4P0030 at 3475w0d being seen today for ongoing prenatal care.  She is currently monitored for the following issues for this high-risk pregnancy and has Supervision of high risk pregnancy, antepartum; Pre-existing type 2 diabetes mellitus in pregnancy in second trimester; Hypothyroidism affecting pregnancy, antepartum; Advanced maternal age in multigravida; Obesity in pregnancy; Chronic hypertension in pregnancy; Abnormal antenatal test; PCOS (polycystic ovarian syndrome); GERD (gastroesophageal reflux disease); Psoriasis; Recurrent pregnancy loss; Antiphospholipid antibody syndrome (HCC); Elevated TSH; and Hypomenorrhea/oligomenorrhea on their problem list.  Patient reports no complaints.  Contractions: Not present. Vag. Bleeding: None.  Movement: Present. Denies leaking of fluid.   The following portions of the patient's history were reviewed and updated as appropriate: allergies, current medications, past family history, past medical history, past social history, past surgical history and problem list. Problem list updated.  Objective:   Vitals:   02/14/18 0808  BP: 134/77  Pulse: 96  Weight: 218 lb (98.9 kg)    Fetal Status: Fetal Heart Rate (bpm): 151   Movement: Present     General:  Alert, oriented and cooperative. Patient is in no acute distress.  Skin: Skin is warm and dry. No rash noted.   Cardiovascular: Normal heart rate noted  Respiratory: Normal respiratory effort, no problems with respiration noted  Abdomen: Soft, gravid, appropriate for gestational age.  Pain/Pressure: Absent     Pelvic: Cervical exam deferred        Extremities: Normal range of motion.  Edema: None  Mental Status: Normal mood and affect. Normal behavior. Normal judgment and thought content.   Assessment and Plan:  Pregnancy: G4P0030 at 8775w0d  1. Multigravida of advanced maternal age in second trimester - inadequate cells for NIPS  2.  Antiphospholipid antibody syndrome (HCC) - on lovenox  3. Chronic hypertension in pregnancy - on asa daily BP WNL today  4. Hypothyroidism affecting pregnancy, antepartum - check TSH q trimester - TSH 1.65   5. Obesity in pregnancy - she had an appt with nutritionist on 02/01/18, challenging on nights - 12 pound weight gain recommended DIet tips reviewed, add exercise  6. Supervision of high risk pregnancy, antepartum - US normal, they recommended growth in 4 weeks  7. Type 2 diabetes mellitus affecting pregnancy, antepartum She had been instructed to increase her NPH by 2 units QOD until she reaches the target of fastings less than 90, refilled NPH supply Glucose values mixed.  Works nights.  About half of fastings are good, others are mid-90s to low 100s.  Some 2 hr PC values elevated.     Preterm labor symptoms and general obstetric precautions including but not limited to vaginal bleeding, contractions, leaking of fluid and fetal movement were reviewed in detail with the patient. Please refer to After Visit Summary for other counseling recommendations.  RTO 03/01/18  Future Appointments  Date Time Provider Department Center  03/10/2018  1:00 PM [facility] US 3 [facility]  04/07/2018  1:00 PM [facility] US 3 [facility]    Wynelle BourgeoisMarie Peder Allums, CNM

## 2018-03-01 ENCOUNTER — Ambulatory Visit (INDEPENDENT_AMBULATORY_CARE_PROVIDER_SITE_OTHER): Payer: PRIVATE HEALTH INSURANCE | Admitting: Obstetrics & Gynecology

## 2018-03-01 VITALS — BP 107/69 | HR 89 | Wt 219.0 lb

## 2018-03-01 DIAGNOSIS — O09522 Supervision of elderly multigravida, second trimester: Secondary | ICD-10-CM

## 2018-03-01 DIAGNOSIS — E039 Hypothyroidism, unspecified: Secondary | ICD-10-CM

## 2018-03-01 DIAGNOSIS — O99280 Endocrine, nutritional and metabolic diseases complicating pregnancy, unspecified trimester: Secondary | ICD-10-CM

## 2018-03-01 DIAGNOSIS — O10919 Unspecified pre-existing hypertension complicating pregnancy, unspecified trimester: Secondary | ICD-10-CM

## 2018-03-01 DIAGNOSIS — O0990 Supervision of high risk pregnancy, unspecified, unspecified trimester: Secondary | ICD-10-CM

## 2018-03-01 DIAGNOSIS — O24112 Pre-existing diabetes mellitus, type 2, in pregnancy, second trimester: Secondary | ICD-10-CM

## 2018-03-01 DIAGNOSIS — O99210 Obesity complicating pregnancy, unspecified trimester: Secondary | ICD-10-CM

## 2018-03-01 NOTE — Progress Notes (Signed)
   PRENATAL VISIT NOTE  Subjective:  Kelli Miller is a 37 y.o. G4P0030 at [redacted]w[redacted]d being seen today for ongoing prenatal care.  She is currently monitored for the following issues for this high-risk pregnancy and has Supervision of high risk pregnancy, antepartum; Pre-existing type 2 diabetes mellitus in pregnancy in second trimester; Hypothyroidism affecting pregnancy, antepartum; Advanced maternal age in multigravida; Obesity in pregnancy; Chronic hypertension in pregnancy; Abnormal antenatal test; PCOS (polycystic ovarian syndrome); GERD (gastroesophageal reflux disease); Psoriasis; Recurrent pregnancy loss; Antiphospholipid antibody syndrome (HCC); Elevated TSH; and Hypomenorrhea/oligomenorrhea on their problem list.  Patient reports no complaints.  Contractions: Not present. Vag. Bleeding: None.  Movement: Present. Denies leaking of fluid.   The following portions of the patient's history were reviewed and updated as appropriate: allergies, current medications, past family history, past medical history, past social history, past surgical history and problem list. Problem list updated.  Objective:   Vitals:   03/01/18 1334  BP: 107/69  Pulse: 89  Weight: 219 lb (99.3 kg)    Fetal Status: Fetal Heart Rate (bpm): 148   Movement: Present     General:  Alert, oriented and cooperative. Patient is in no acute distress.  Skin: Skin is warm and dry. No rash noted.   Cardiovascular: Normal heart rate noted  Respiratory: Normal respiratory effort, no problems with respiration noted  Abdomen: Soft, gravid, appropriate for gestational age.  Pain/Pressure: Absent     Pelvic: Cervical exam deferred        Extremities: Normal range of motion.  Edema: None  Mental Status: Normal mood and affect. Normal behavior. Normal judgment and thought content.   Assessment and Plan:  Pregnancy: G4P0030 at [redacted]w[redacted]d  1. Supervision of high risk pregnancy, antepartum   2. Pre-existing type 2 diabetes mellitus in  pregnancy in second trimester - her fastings are still above 90, so I have recommended that she increase her long acting bedtime insulin from 50 to 52  3. Obesity in pregnancy   4. Hypothyroidism affecting pregnancy, antepartum - followed by an endocrinologist  5. Chronic hypertension in pregnancy Doing well on labatelol Baby asa  6. Multigravida of advanced maternal age in second trimester   Preterm labor symptoms and general obstetric precautions including but not limited to vaginal bleeding, contractions, leaking of fluid and fetal movement were reviewed in detail with the patient. Please refer to After Visit Summary for other counseling recommendations.  No follow-ups on file.  Future Appointments  Date Time Provider Department Center  03/10/2018  1:00 PM [facility] [place] 3 [facility]  04/07/2018  1:00 PM [facility] [place] 3 [facility]    Allie Bossier, MD

## 2018-03-10 ENCOUNTER — Ambulatory Visit (HOSPITAL_COMMUNITY)
Admission: RE | Admit: 2018-03-10 | Discharge: 2018-03-10 | Disposition: A | Discharge status: Home / Self Care | Payer: PRIVATE HEALTH INSURANCE | Source: Ambulatory Visit | Attending: Obstetrics and Gynecology | Admitting: Obstetrics and Gynecology

## 2018-03-10 ENCOUNTER — Encounter (HOSPITAL_COMMUNITY): Payer: Self-pay

## 2018-03-10 ENCOUNTER — Other Ambulatory Visit (HOSPITAL_COMMUNITY): Payer: Self-pay | Admitting: Maternal and Fetal Medicine

## 2018-03-10 DIAGNOSIS — Z3A22 22 weeks gestation of pregnancy: Secondary | ICD-10-CM

## 2018-03-10 DIAGNOSIS — O24112 Pre-existing diabetes mellitus, type 2, in pregnancy, second trimester: Secondary | ICD-10-CM

## 2018-03-10 DIAGNOSIS — O162 Unspecified maternal hypertension, second trimester: Secondary | ICD-10-CM

## 2018-03-10 DIAGNOSIS — Z7984 Long term (current) use of oral hypoglycemic drugs: Principal | ICD-10-CM

## 2018-03-10 DIAGNOSIS — O09522 Supervision of elderly multigravida, second trimester: Secondary | ICD-10-CM | POA: Insufficient documentation

## 2018-03-10 DIAGNOSIS — E039 Hypothyroidism, unspecified: Secondary | ICD-10-CM

## 2018-03-10 DIAGNOSIS — O99282 Endocrine, nutritional and metabolic diseases complicating pregnancy, second trimester: Secondary | ICD-10-CM | POA: Insufficient documentation

## 2018-03-10 DIAGNOSIS — O24312 Unspecified pre-existing diabetes mellitus in pregnancy, second trimester: Secondary | ICD-10-CM | POA: Diagnosis not present

## 2018-03-10 DIAGNOSIS — D6861 Antiphospholipid syndrome: Secondary | ICD-10-CM | POA: Diagnosis not present

## 2018-03-10 DIAGNOSIS — Z362 Encounter for other antenatal screening follow-up: Secondary | ICD-10-CM

## 2018-03-10 DIAGNOSIS — O99280 Endocrine, nutritional and metabolic diseases complicating pregnancy, unspecified trimester: Secondary | ICD-10-CM

## 2018-03-10 DIAGNOSIS — O99112 Other diseases of the blood and blood-forming organs and certain disorders involving the immune mechanism complicating pregnancy, second trimester: Secondary | ICD-10-CM | POA: Diagnosis not present

## 2018-03-10 DIAGNOSIS — O99212 Obesity complicating pregnancy, second trimester: Secondary | ICD-10-CM | POA: Diagnosis not present

## 2018-03-10 DIAGNOSIS — O10919 Unspecified pre-existing hypertension complicating pregnancy, unspecified trimester: Secondary | ICD-10-CM

## 2018-03-10 DIAGNOSIS — O0990 Supervision of high risk pregnancy, unspecified, unspecified trimester: Secondary | ICD-10-CM

## 2018-03-10 DIAGNOSIS — O289 Unspecified abnormal findings on antenatal screening of mother: Secondary | ICD-10-CM

## 2018-03-10 DIAGNOSIS — O24119 Pre-existing diabetes mellitus, type 2, in pregnancy, unspecified trimester: Secondary | ICD-10-CM

## 2018-03-10 IMAGING — US US MFM OB FOLLOW-UP
1 series · 14 of 28 positions shown · non-contrast
Comparison: none

[Series 1: us mfm ob follow-up · 44 acquisitions, 14 frames shown]
[im 2/44]
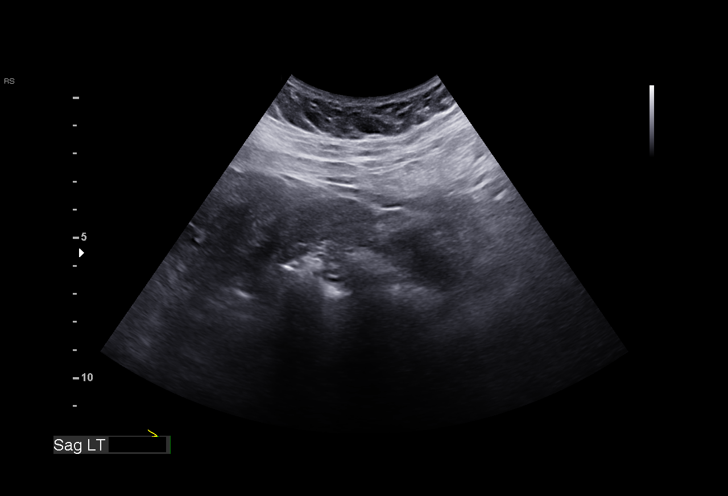
[im 5/44]
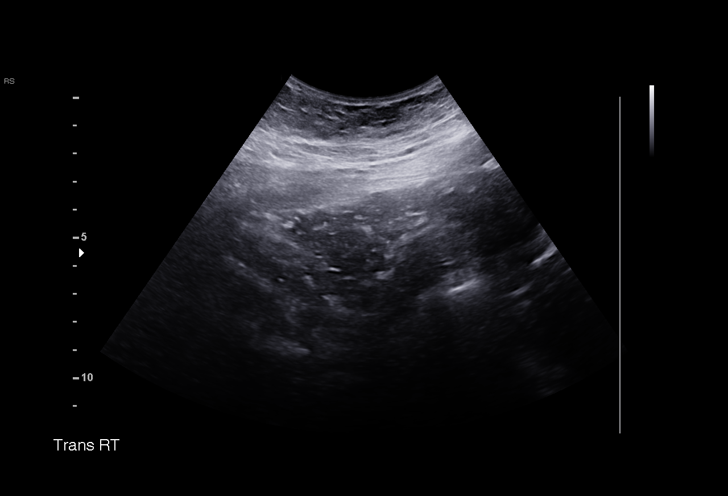
[im 8/44]
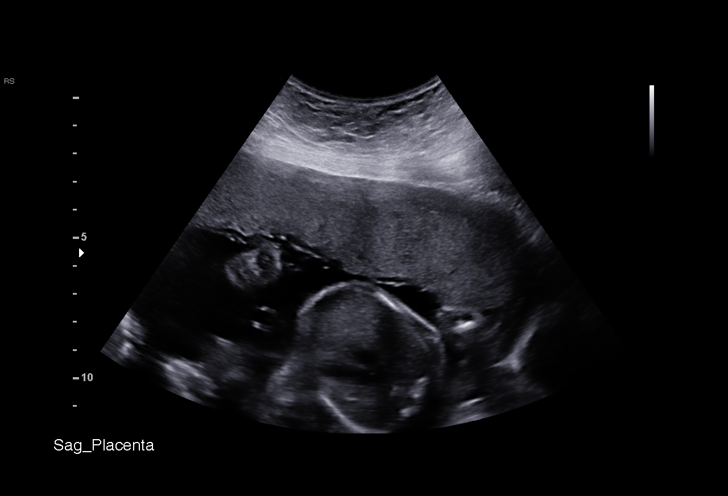
[im 12/44]
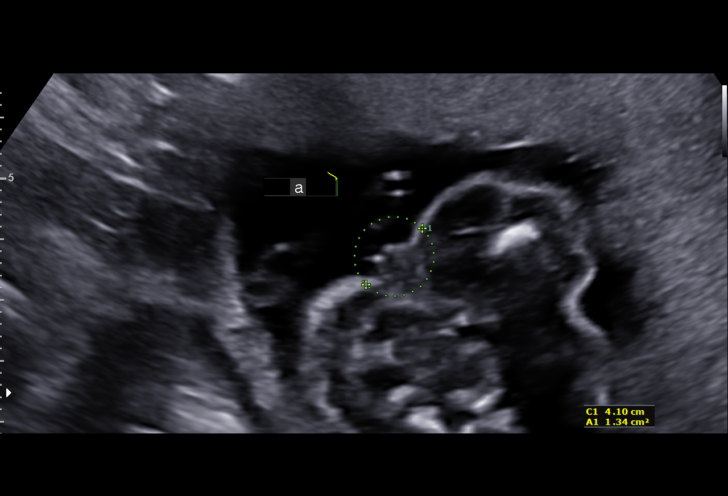
[im 15/44]
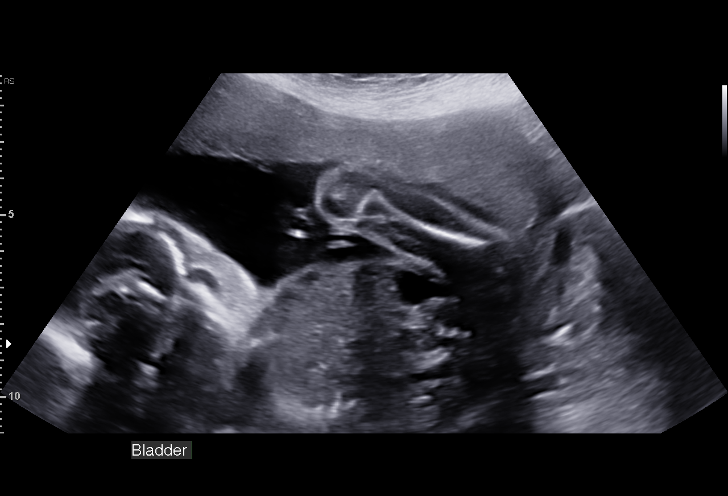
[im 18/44]
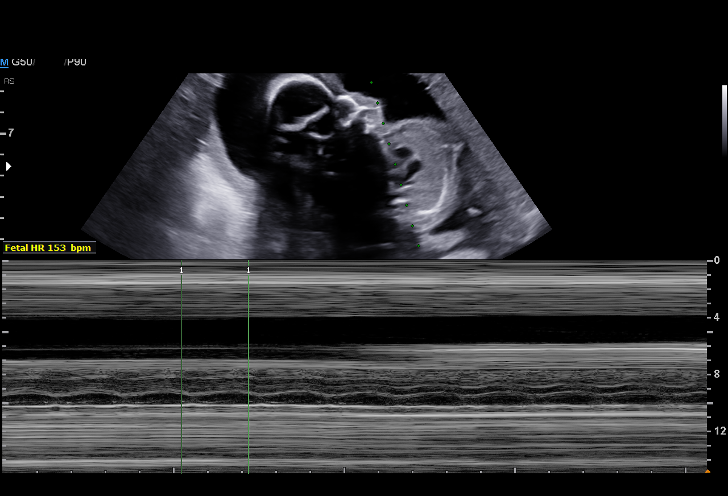
[im 21/44]
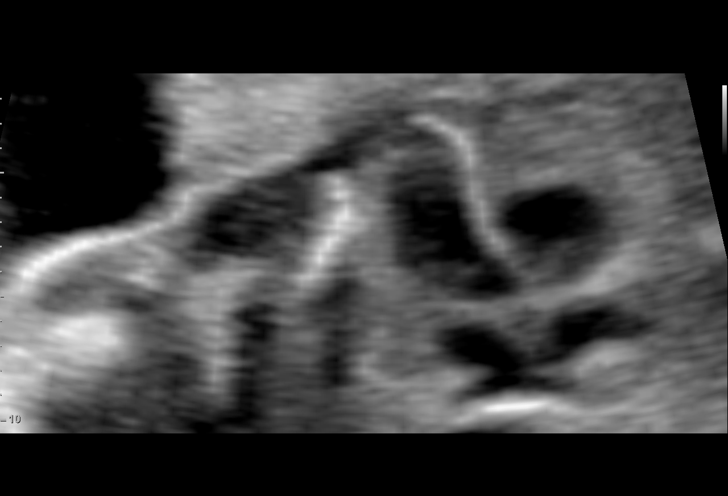
[im 24/44]
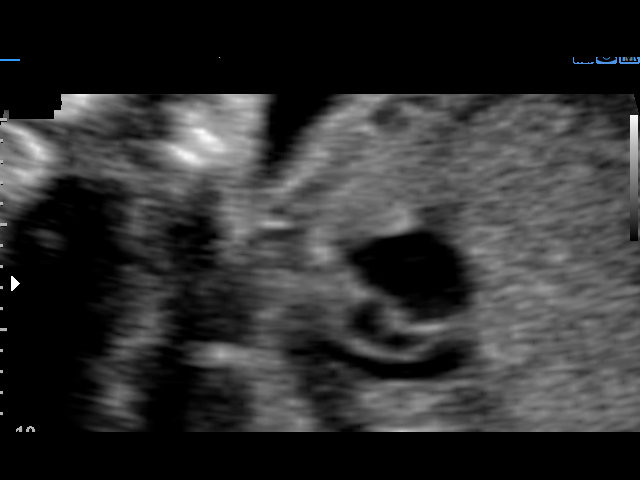
[im 28/44]
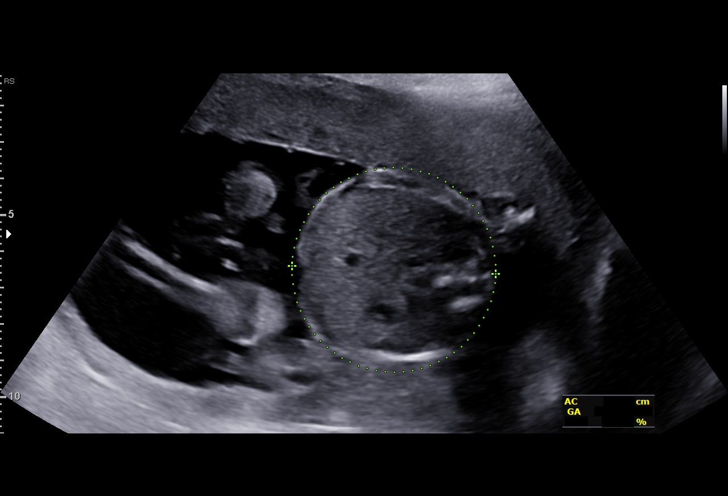
[im 31/44]
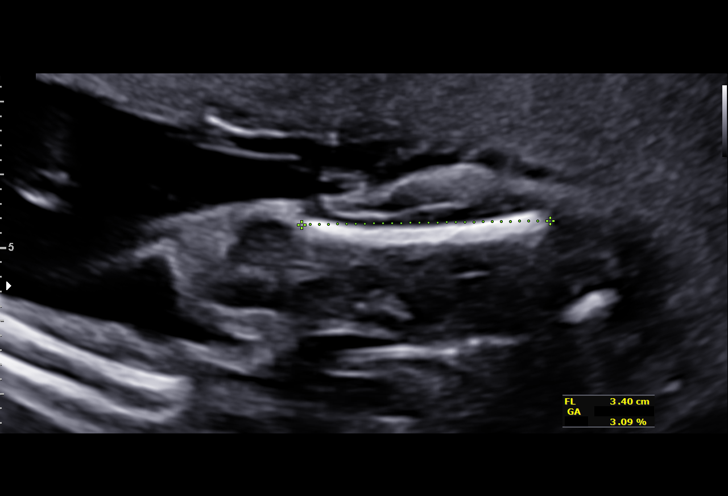
[im 34/44]
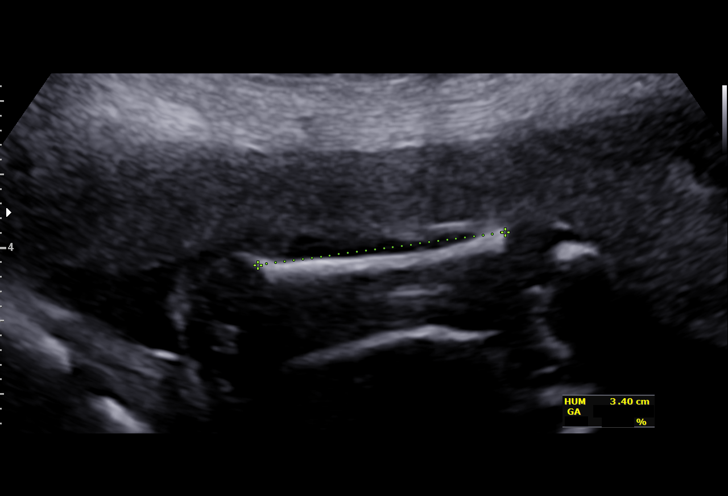
[im 37/44]
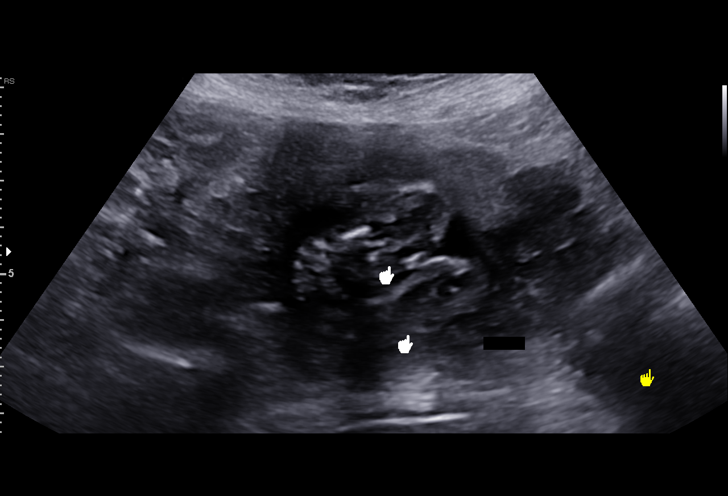
[im 40/44]
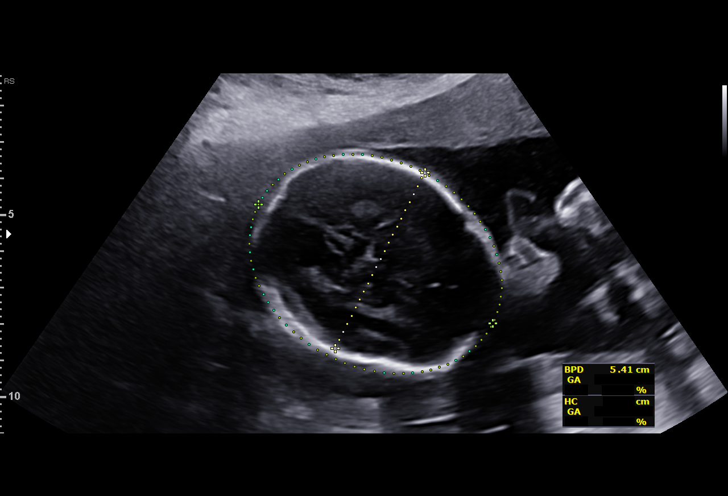
[im 44/44]
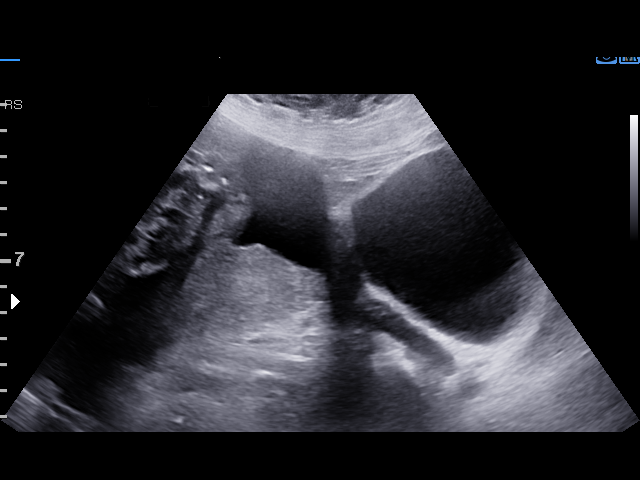

[14 of 28 positions shown; findings below may reference images not displayed]

OB/Gyn Clinic

1  POPO            [PHONE_NUMBER]      [PHONE_NUMBER]     [PHONE_NUMBER]
Indications

22 weeks gestation of pregnancy
Diabetes - Pregestational, 2nd trimester       [E1]
(metformin and insulin)
Advanced maternal age multigravida 35+,        [E1]
second trimester; NIPS low fetal fraction
Obesity complicating pregnancy, second         [E1]
trimester
Hypothyroid (synthroid)                        [E1] [E1]
Hypertension - Chronic/Pre-existing            [E1]
(labetalol)
Antiphospholipid syndrome complicating         O99.119, [E1]
pregnancy, antepartum (Lovenox)
Encounter for other antenatal screening        [E1]
follow-up
OB History

Blood Type:            Height:  5'1"   Weight (lb):  212       BMI:
Gravidity:    4          SAB:   3
Living:       0
Fetal Evaluation

Num Of Fetuses:     1
Fetal Heart         149
Rate(bpm):
Cardiac Activity:   Observed
Presentation:       Transverse, head to maternal right
Placenta:           Anterior, above cervical os
P. Cord Insertion:  Previously Visualized

Amniotic Fluid
AFI FV:      Subjectively within normal limits

Largest Pocket(cm)
5.38
Biometry

BPD:      54.9  mm     G. Age:  22w 5d         58  %    CI:        72.73   %    70 - 86
FL/HC:      17.0   %    18.4 -
HC:      204.7  mm     G. Age:  22w 4d         44  %    HC/AC:      1.15        1.06 -
AC:      177.9  mm     G. Age:  22w 5d         50  %    FL/BPD:     63.6   %    71 - 87
FL:       34.9  mm     G. Age:  21w 0d          7  %    FL/AC:      19.6   %    20 - 24
HUM:      33.6  mm     G. Age:  21w 3d         23  %
Est. FW:     470  gm      1 lb 1 oz     39  %
Gestational Age

LMP:           22w 3d        Date:  [DATE]                 EDD:   [DATE]
U/S Today:     22w 2d                                        EDD:   [DATE]
Best:          22w 3d     Det. By:  LMP  ([DATE])          EDD:   [DATE]
Anatomy

Cranium:               Appears normal         Aortic Arch:            Previously seen
Cavum:                 Previously seen        Ductal Arch:            Previously seen
Ventricles:            Previously seen        Diaphragm:              Previously seen
Choroid Plexus:        Previously seen        Stomach:                Appears normal, left
sided
Cerebellum:            Previously seen        Abdomen:                Appears normal
Posterior Fossa:       Previously seen        Abdominal Wall:         Previously seen
Nuchal Fold:           Not applicable (>20    Cord Vessels:           Previously seen
wks GA)
Face:                  Orbits and profile     Kidneys:                Appear normal
previously seen
Lips:                  Previously seen        Bladder:                Appears normal
Thoracic:              Appears normal         Spine:                  Previously seen
Heart:                 Appears normal         Upper Extremities:      Previously seen
(4CH, axis, and
situs)
RVOT:                  Appears normal         Lower Extremities:      Previously seen
LVOT:                  Previously seen

Other:  Fetus appears to be a female. Heels and LT 5th digit prev visualized.
Nasal bone prev visualized. Technically difficult due to maternal
habitus and fetal position.
Cervix Uterus Adnexa

Cervix
Length:           5.38  cm.
Normal appearance by transabdominal scan.

Uterus
No abnormality visualized.
Left Ovary
Not visualized.

Right Ovary
Not visualized.

Adnexa:       No abnormality visualized. No adnexal mass
visualized.
Impression

SIUP at 22+3 weeks
Normal interval anatomy; anatomic survey complete
Normal amniotic fluid volume
Appropriate interval growth with EFW at the 39th %tile
Recommendations

Follow-up ultrasound for growth in 4 weeks

## 2018-03-11 ENCOUNTER — Other Ambulatory Visit (HOSPITAL_COMMUNITY): Payer: Self-pay | Admitting: *Deleted

## 2018-03-11 DIAGNOSIS — O10919 Unspecified pre-existing hypertension complicating pregnancy, unspecified trimester: Secondary | ICD-10-CM

## 2018-03-15 ENCOUNTER — Ambulatory Visit (INDEPENDENT_AMBULATORY_CARE_PROVIDER_SITE_OTHER): Payer: PRIVATE HEALTH INSURANCE | Admitting: Obstetrics & Gynecology

## 2018-03-15 VITALS — BP 126/82 | HR 89 | Wt 224.0 lb

## 2018-03-15 DIAGNOSIS — O10919 Unspecified pre-existing hypertension complicating pregnancy, unspecified trimester: Secondary | ICD-10-CM

## 2018-03-15 DIAGNOSIS — O24112 Pre-existing diabetes mellitus, type 2, in pregnancy, second trimester: Secondary | ICD-10-CM

## 2018-03-15 DIAGNOSIS — O0992 Supervision of high risk pregnancy, unspecified, second trimester: Secondary | ICD-10-CM

## 2018-03-15 DIAGNOSIS — O10912 Unspecified pre-existing hypertension complicating pregnancy, second trimester: Secondary | ICD-10-CM

## 2018-03-15 DIAGNOSIS — O09522 Supervision of elderly multigravida, second trimester: Secondary | ICD-10-CM

## 2018-03-15 DIAGNOSIS — O0990 Supervision of high risk pregnancy, unspecified, unspecified trimester: Secondary | ICD-10-CM

## 2018-03-15 NOTE — Progress Notes (Signed)
   PRENATAL VISIT NOTE  Subjective:  Kelli Miller is a 37 y.o. G4P0030 at [redacted]w[redacted]d being seen today for ongoing prenatal care.  She is currently monitored for the following issues for this high-risk pregnancy and has Supervision of high risk pregnancy, antepartum; Pre-existing type 2 diabetes mellitus in pregnancy in second trimester; Hypothyroidism affecting pregnancy, antepartum; Advanced maternal age in multigravida; Obesity in pregnancy; Chronic hypertension in pregnancy; Abnormal antenatal test; PCOS (polycystic ovarian syndrome); GERD (gastroesophageal reflux disease); Psoriasis; Recurrent pregnancy loss; Antiphospholipid antibody syndrome (HCC); Elevated TSH; and Hypomenorrhea/oligomenorrhea on their problem list.  Patient reports no complaints.  Contractions: Not present. Vag. Bleeding: None.  Movement: Present. Denies leaking of fluid.   The following portions of the patient's history were reviewed and updated as appropriate: allergies, current medications, past family history, past medical history, past social history, past surgical history and problem list. Problem list updated.  Objective:   Vitals:   03/15/18 1351  BP: 126/82  Pulse: 89  Weight: 224 lb (101.6 kg)    Fetal Status: Fetal Heart Rate (bpm): 151   Movement: Present     General:  Alert, oriented and cooperative. Patient is in no acute distress.  Skin: Skin is warm and dry. No rash noted.   Cardiovascular: Normal heart rate noted  Respiratory: Normal respiratory effort, no problems with respiration noted  Abdomen: Soft, gravid, appropriate for gestational age.  Pain/Pressure: Absent     Pelvic: Cervical exam deferred        Extremities: Normal range of motion.  Edema: None  Mental Status: Normal mood and affect. Normal behavior. Normal judgment and thought content.   Assessment and Plan:  Pregnancy: G4P0030 at [redacted]w[redacted]d  1. Supervision of high risk pregnancy, antepartum   2. Pre-existing type 2 diabetes mellitus in  pregnancy in second trimester - excellent sugars  3. Chronic hypertension in pregnancy - stable, no meds  4. Multigravida of advanced maternal age in second trimester   Preterm labor symptoms and general obstetric precautions including but not limited to vaginal bleeding, contractions, leaking of fluid and fetal movement were reviewed in detail with the patient. Please refer to After Visit Summary for other counseling recommendations.  No follow-ups on file.  Future Appointments  Date Time Provider Department Center  04/07/2018  1:00 PM [facility] [place] 3 [facility]  05/05/2018  3:15 PM [facility] [place] 4 [facility]    Allie Bossier, MD

## 2018-03-29 ENCOUNTER — Ambulatory Visit (INDEPENDENT_AMBULATORY_CARE_PROVIDER_SITE_OTHER): Payer: PRIVATE HEALTH INSURANCE | Admitting: Obstetrics & Gynecology

## 2018-03-29 DIAGNOSIS — O0990 Supervision of high risk pregnancy, unspecified, unspecified trimester: Secondary | ICD-10-CM

## 2018-03-29 DIAGNOSIS — O0992 Supervision of high risk pregnancy, unspecified, second trimester: Secondary | ICD-10-CM

## 2018-03-29 NOTE — Progress Notes (Signed)
   PRENATAL VISIT NOTE  Subjective:  [name] is a 37 y.o. G4P0030 at 672w1d being seen today for ongoing prenatal care.  She is currently monitored for the following issues for this high-risk pregnancy and has Supervision of high risk pregnancy, antepartum; Pre-existing type 2 diabetes mellitus in pregnancy in second trimester; Hypothyroidism affecting pregnancy, antepartum; Advanced maternal age in multigravida; Obesity in pregnancy; Chronic hypertension in pregnancy; Abnormal antenatal test; PCOS (polycystic ovarian syndrome); GERD (gastroesophageal reflux disease); Psoriasis; Recurrent pregnancy loss; Antiphospholipid antibody syndrome (HCC); Elevated TSH; and Hypomenorrhea/oligomenorrhea on their problem list.  Patient reports no complaints.  Contractions: Not present. Vag. Bleeding: None.  Movement: Present. Denies leaking of fluid.   The following portions of the patient's history were reviewed and updated as appropriate: allergies, current medications, past family history, past medical history, past social history, past surgical history and problem list. Problem list updated.  Objective:   Vitals:   03/29/18 1350  BP: 114/65  Pulse: 94  Weight: 226 lb (102.5 kg)    Fetal Status: Fetal Heart Rate (bpm): 150   Movement: Present     General:  Alert, oriented and cooperative. Patient is in no acute distress.  Skin: Skin is warm and dry. No rash noted.   Cardiovascular: Normal heart rate noted  Respiratory: Normal respiratory effort, no problems with respiration noted  Abdomen: Soft, gravid, appropriate for gestational age.  Pain/Pressure: Absent     Pelvic: Cervical exam deferred        Extremities: Normal range of motion.  Edema: Trace  Mental Status: Normal mood and affect. Normal behavior. Normal judgment and thought content.   Assessment and Plan:  Pregnancy: G4P0030 at 272w1d  1. Type 2 DM Pt managed by endocrine.  Only taking NPH once a day at night.  Pt works 2 twelve  hour shifts then of 2 days.  She converts back to sleeping at night on her of days.  Pt not well controlled.  Suggest endocrinologist put her on Lantus and Novalog.  Will call endocrinologist to discuss.  2.  CHTN Well controlled  3.  US q 4 weeks with testing at 32 weeks  4.  Continue Lovenos for APLA  Preterm labor symptoms and general obstetric precautions including but not limited to vaginal bleeding, contractions, leaking of fluid and fetal movement were reviewed in detail with the patient. Please refer to After Visit Summary for other counseling recommendations.   RTC in 2 weeks if endocrinologist continues to manage.  RTC this week if endocrinologist does not manage DM any longer.    Future Appointments  Date Time Provider Department Center  04/07/2018  1:00 PM [facility] US 3 [facility]  04/12/2018  3:00 PM Allie Bossierove, Myra C, MD [facility]  05/05/2018  3:15 PM [facility] US 4 WH-[facility]-US    Elsie LincolnKelly Mattthew Ziomek, MD   Addendum:  Called her endocrinology office several times and discussed wit hnursing staff the need to speak with MD or NP and than Rosio's insulin must be changed.  No one has returned my phone calls.  Pt will be scheduled for apptwith our office and we will change her insulin.

## 2018-04-07 ENCOUNTER — Other Ambulatory Visit (HOSPITAL_COMMUNITY): Payer: Self-pay | Admitting: Maternal and Fetal Medicine

## 2018-04-07 ENCOUNTER — Ambulatory Visit (HOSPITAL_COMMUNITY)
Admission: RE | Admit: 2018-04-07 | Discharge: 2018-04-07 | Disposition: A | Discharge status: Home / Self Care | Payer: PRIVATE HEALTH INSURANCE | Source: Ambulatory Visit | Attending: Obstetrics and Gynecology | Admitting: Obstetrics and Gynecology

## 2018-04-07 ENCOUNTER — Encounter (HOSPITAL_COMMUNITY): Payer: Self-pay

## 2018-04-07 DIAGNOSIS — E039 Hypothyroidism, unspecified: Secondary | ICD-10-CM

## 2018-04-07 DIAGNOSIS — O99280 Endocrine, nutritional and metabolic diseases complicating pregnancy, unspecified trimester: Secondary | ICD-10-CM

## 2018-04-07 DIAGNOSIS — O09522 Supervision of elderly multigravida, second trimester: Secondary | ICD-10-CM | POA: Insufficient documentation

## 2018-04-07 DIAGNOSIS — O289 Unspecified abnormal findings on antenatal screening of mother: Secondary | ICD-10-CM

## 2018-04-07 DIAGNOSIS — O24119 Pre-existing diabetes mellitus, type 2, in pregnancy, unspecified trimester: Secondary | ICD-10-CM

## 2018-04-07 DIAGNOSIS — Z3A26 26 weeks gestation of pregnancy: Secondary | ICD-10-CM | POA: Diagnosis not present

## 2018-04-07 DIAGNOSIS — O162 Unspecified maternal hypertension, second trimester: Secondary | ICD-10-CM

## 2018-04-07 DIAGNOSIS — Z7984 Long term (current) use of oral hypoglycemic drugs: Secondary | ICD-10-CM | POA: Insufficient documentation

## 2018-04-07 DIAGNOSIS — O99212 Obesity complicating pregnancy, second trimester: Secondary | ICD-10-CM

## 2018-04-07 DIAGNOSIS — Z362 Encounter for other antenatal screening follow-up: Secondary | ICD-10-CM | POA: Diagnosis not present

## 2018-04-07 DIAGNOSIS — O0990 Supervision of high risk pregnancy, unspecified, unspecified trimester: Secondary | ICD-10-CM

## 2018-04-07 DIAGNOSIS — O10919 Unspecified pre-existing hypertension complicating pregnancy, unspecified trimester: Secondary | ICD-10-CM

## 2018-04-07 DIAGNOSIS — O24112 Pre-existing diabetes mellitus, type 2, in pregnancy, second trimester: Secondary | ICD-10-CM

## 2018-04-07 IMAGING — US US MFM OB FOLLOW-UP
1 series · 14 of 28 positions shown · non-contrast
Comparison: none

[Series 1: us mfm ob follow-up · 14 of 47 slices shown]
[im 2/47]
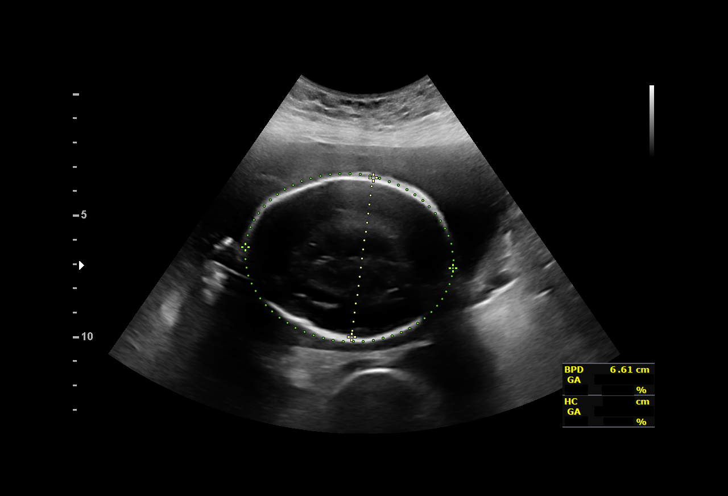
[im 6/47]
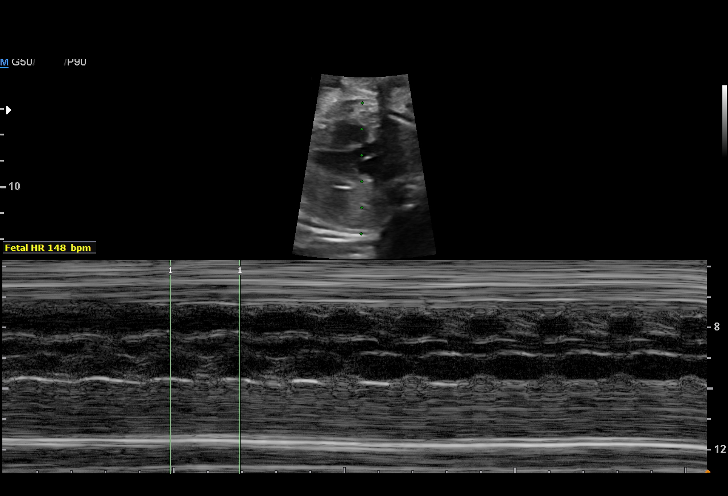
[im 9/47]
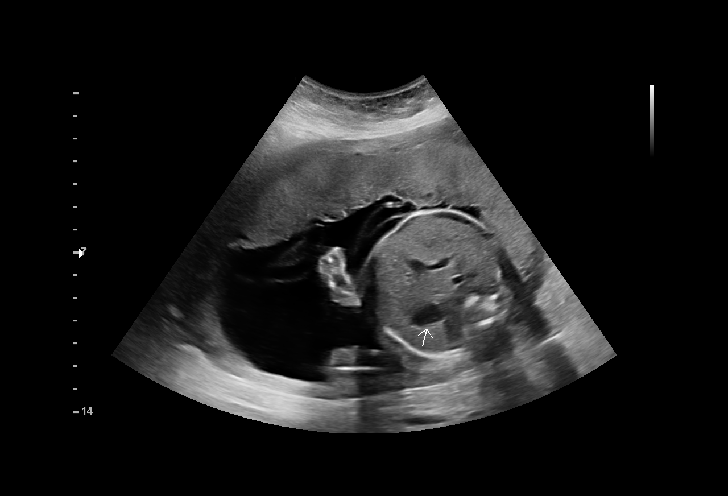
[im 12/47]
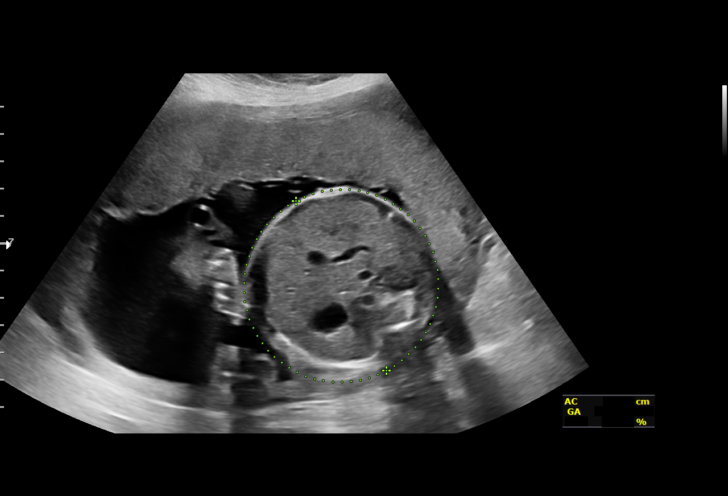
[im 16/47]
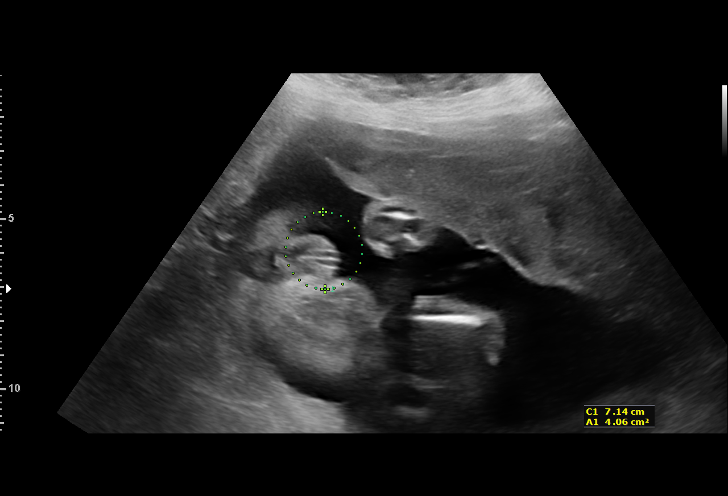
[im 19/47]
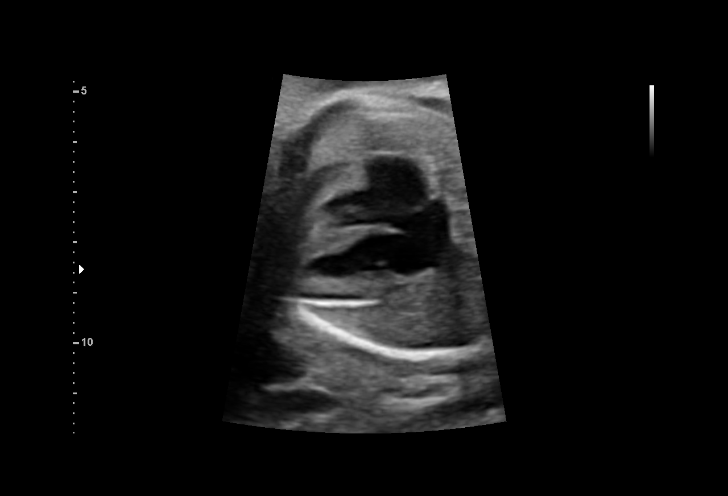
[im 23/47]
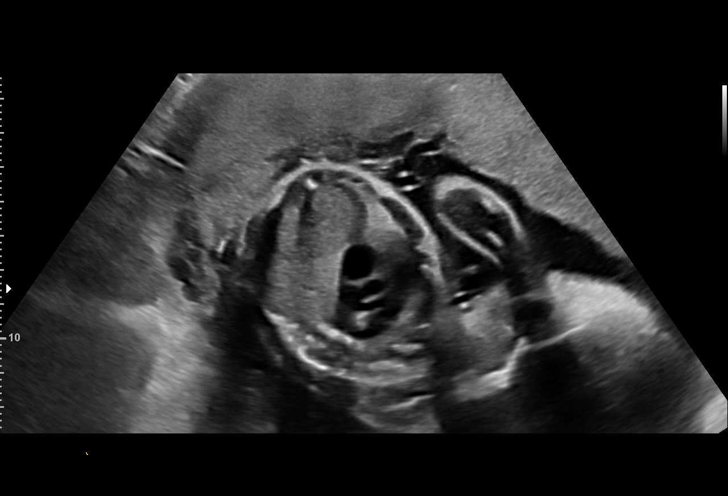
[im 26/47]
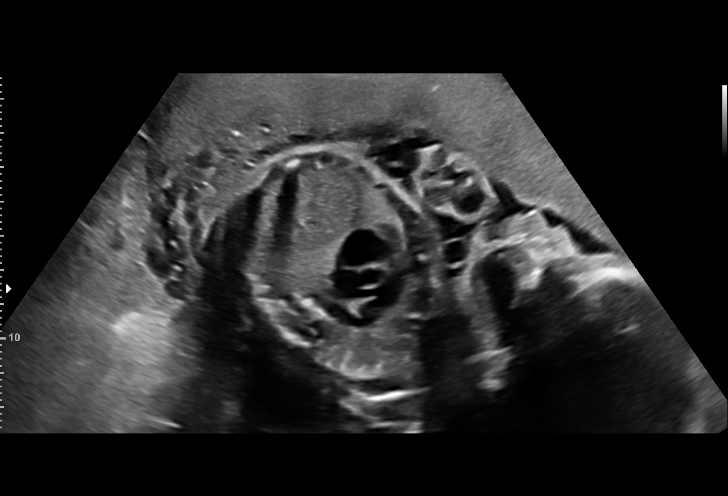
[im 29/47]
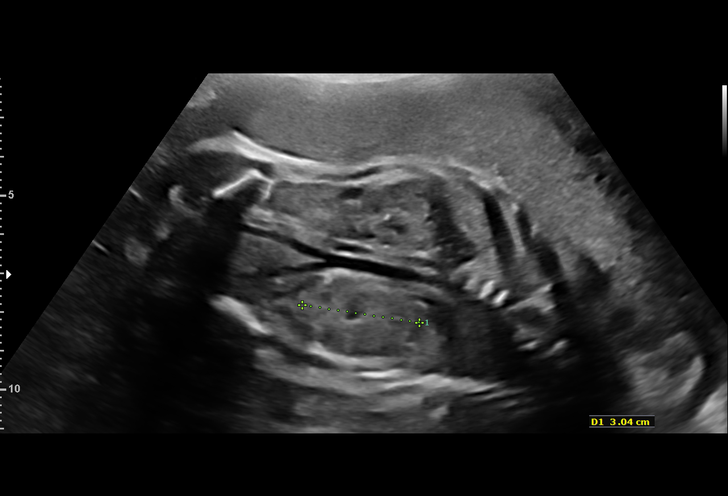
[im 33/47]
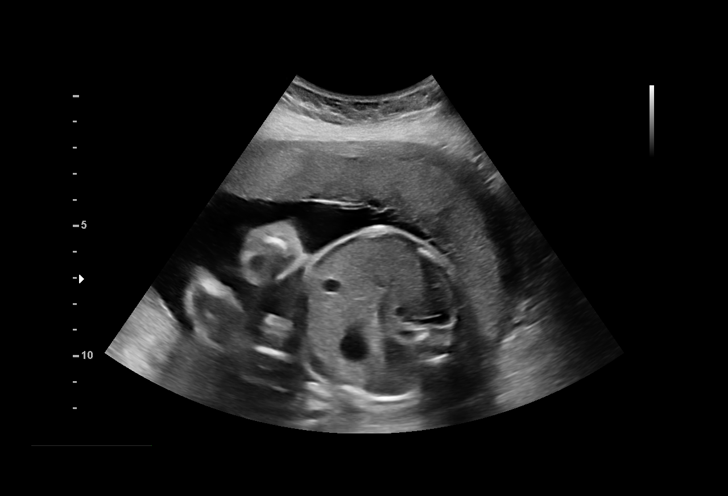
[im 36/47]
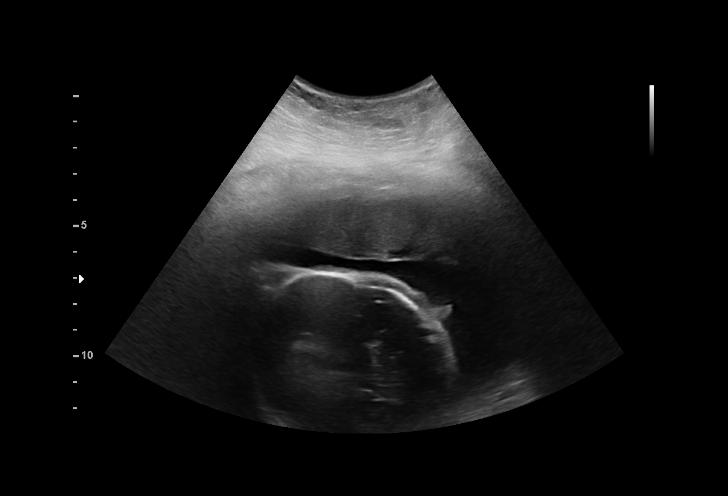
[im 40/47]
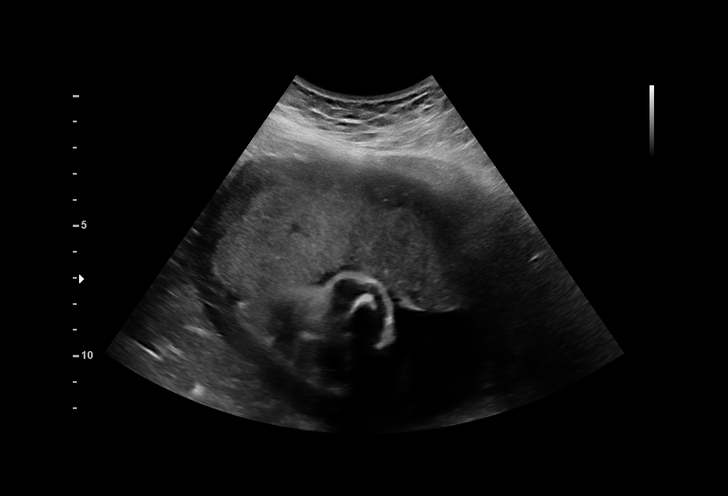
[im 43/47]
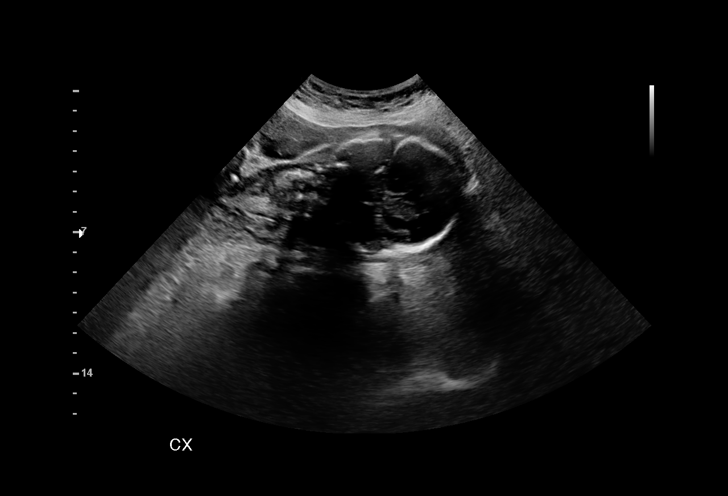
[im 47/47]
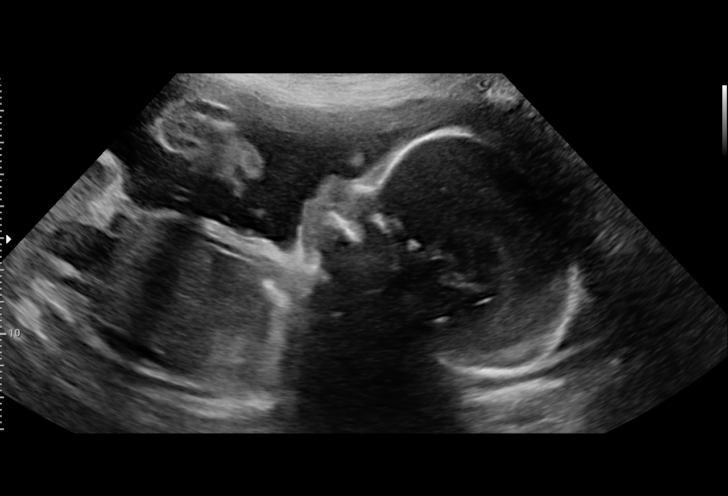

[14 of 28 positions shown; findings below may reference images not displayed]

OB/Gyn Clinic

1  MAIN            [PHONE_NUMBER]      [PHONE_NUMBER]     [PHONE_NUMBER]
Indications

26 weeks gestation of pregnancy
Diabetes - Pregestational, 2nd trimester       [FG]
(metformin and insulin)
Advanced maternal age multigravida 35+,        [FG]
second trimester; NIPS: no result d/t low
fetal fraction; decline repeat/amnio
Obesity complicating pregnancy, second         [FG]
trimester
Hypothyroid (synthroid)                        [FG] [FG]
Hypertension - Chronic/Pre-existing            [FG]
(labetalol)
Antiphospholipid syndrome complicating         O99.119, [FG]
pregnancy, antepartum (Lovenox)
Encounter for other antenatal screening        [FG]
follow-up
OB History

Blood Type:            Height:  5'1"   Weight (lb):  212       BMI:
Gravidity:    4          SAB:   3
Living:       0
Fetal Evaluation

Num Of Fetuses:     1
Fetal Heart         148
Rate(bpm):
Cardiac Activity:   Observed
Presentation:       Cephalic
Placenta:           Anterior, above cervical os
P. Cord Insertion:  Previously Visualized

Amniotic Fluid
AFI FV:      Subjectively within normal limits

Largest Pocket(cm)
4.87
Biometry

BPD:      66.5  mm     G. Age:  26w 6d         53  %    CI:        76.77   %    70 - 86
FL/HC:      18.7   %    18.6 -
HC:      240.4  mm     G. Age:  26w 1d         17  %    HC/AC:      1.10        1.04 -
AC:      219.2  mm     G. Age:  26w 3d         40  %    FL/BPD:     67.7   %    71 - 87
FL:         45  mm     G. Age:  24w 6d          5  %    FL/AC:      20.5   %    20 - 24
HUM:      40.7  mm     G. Age:  24w 5d        < 5  %

Est. FW:     859  gm    1 lb 14 oz      40  %
Gestational Age

LMP:           26w 3d        Date:  [DATE]                 EDD:   [DATE]
U/S Today:     26w 1d                                        EDD:   [DATE]
Best:          26w 3d     Det. By:  LMP  ([DATE])          EDD:   [DATE]
Anatomy

Cranium:               Appears normal         Aortic Arch:            Previously seen
Cavum:                 Previously seen        Ductal Arch:            Previously seen
Ventricles:            Appears normal         Diaphragm:              Previously seen
Choroid Plexus:        Previously seen        Stomach:                Appears normal, left
sided
Cerebellum:            Previously seen        Abdomen:                Appears normal
Posterior Fossa:       Previously seen        Abdominal Wall:         Previously seen
Nuchal Fold:           Not applicable (>20    Cord Vessels:           Previously seen
wks GA)
Face:                  Orbits and profile     Kidneys:                Appear normal
previously seen
Lips:                  Previously seen        Bladder:                Appears normal
Thoracic:              Appears normal         Spine:                  Previously seen
Heart:                 Appears normal         Upper Extremities:      Previously seen
(4CH, axis, and
situs)
RVOT:                  Appears normal         Lower Extremities:      Previously seen
LVOT:                  Previously seen

Other:  Fetus appears to be a female. Heels and LT 5th digit prev visualized.
Nasal bone prev visualized. Technically difficult due to maternal
habitus and fetal position.
Cervix Uterus Adnexa

Cervix
Not adaquately visualized
Impression

SIUP at 26+3 weeks
Normal interval anatomy; anatomic survey complete
Normal amniotic fluid volume
Appropriate interval growth with EFW at the 40th %tile
Recommendations

Follow-up ultrasound for growth in 4 weeks

## 2018-04-12 ENCOUNTER — Ambulatory Visit (INDEPENDENT_AMBULATORY_CARE_PROVIDER_SITE_OTHER): Payer: PRIVATE HEALTH INSURANCE | Admitting: Obstetrics & Gynecology

## 2018-04-12 VITALS — BP 130/78 | HR 98 | Wt 226.0 lb

## 2018-04-12 DIAGNOSIS — O0990 Supervision of high risk pregnancy, unspecified, unspecified trimester: Secondary | ICD-10-CM

## 2018-04-12 DIAGNOSIS — O99210 Obesity complicating pregnancy, unspecified trimester: Secondary | ICD-10-CM

## 2018-04-12 DIAGNOSIS — O09522 Supervision of elderly multigravida, second trimester: Secondary | ICD-10-CM

## 2018-04-12 DIAGNOSIS — D6861 Antiphospholipid syndrome: Secondary | ICD-10-CM

## 2018-04-12 DIAGNOSIS — O0992 Supervision of high risk pregnancy, unspecified, second trimester: Secondary | ICD-10-CM

## 2018-04-12 DIAGNOSIS — O10919 Unspecified pre-existing hypertension complicating pregnancy, unspecified trimester: Secondary | ICD-10-CM

## 2018-04-12 DIAGNOSIS — O24112 Pre-existing diabetes mellitus, type 2, in pregnancy, second trimester: Secondary | ICD-10-CM

## 2018-04-12 DIAGNOSIS — O99212 Obesity complicating pregnancy, second trimester: Secondary | ICD-10-CM

## 2018-04-12 DIAGNOSIS — O10912 Unspecified pre-existing hypertension complicating pregnancy, second trimester: Secondary | ICD-10-CM

## 2018-04-12 NOTE — Progress Notes (Signed)
   PRENATAL VISIT NOTE  Subjective:  Kelli Miller is a 37 y.o. G4P0030 at 3463w1d being seen today for ongoing prenatal care.  She is currently monitored for the following issues for this high-risk pregnancy and has Supervision of high risk pregnancy, antepartum; Pre-existing type 2 diabetes mellitus in pregnancy in second trimester; Hypothyroidism affecting pregnancy, antepartum; Advanced maternal age in multigravida; Obesity in pregnancy; Chronic hypertension in pregnancy; Abnormal antenatal test; PCOS (polycystic ovarian syndrome); GERD (gastroesophageal reflux disease); Psoriasis; Recurrent pregnancy loss; Antiphospholipid antibody syndrome (HCC); Elevated TSH; and Hypomenorrhea/oligomenorrhea on their problem list.  Patient reports no complaints.  Contractions: Not present. Vag. Bleeding: None.  Movement: (!) Decreased. Denies leaking of fluid.   The following portions of the patient's history were reviewed and updated as appropriate: allergies, current medications, past family history, past medical history, past social history, past surgical history and problem list. Problem list updated.  Objective:   Vitals:   04/12/18 1504  BP: 130/78  Pulse: 98  Weight: 226 lb (102.5 kg)    Fetal Status: Fetal Heart Rate (bpm): 161   Movement: (!) Decreased     General:  Alert, oriented and cooperative. Patient is in no acute distress.  Skin: Skin is warm and dry. No rash noted.   Cardiovascular: Normal heart rate noted  Respiratory: Normal respiratory effort, no problems with respiration noted  Abdomen: Soft, gravid, appropriate for gestational age.  Pain/Pressure: Absent     Pelvic: Cervical exam deferred        Extremities: Normal range of motion.  Edema: Trace  Mental Status: Normal mood and affect. Normal behavior. Normal judgment and thought content.   Assessment and Plan:  Pregnancy: G4P0030 at 363w1d  1. Multigravida of advanced maternal age in second trimester   2. Antiphospholipid  antibody syndrome (HCC) - on lovenox daily  3. Chronic hypertension in pregnancy - stable on labetalol 200 mg BID  4. Obesity in pregnancy   5. Pre-existing type 2 diabetes mellitus in pregnancy in second trimester - she has been seen recently by her endocrinologist who declines at this time to do a long acting insultin with mealtime coverage - She had a recent hba1c that was oddly low at 4.8  6. Supervision of high risk pregnancy, antepartum -start weekly MFM BPP at 32 weeks  Preterm labor symptoms and general obstetric precautions including but not limited to vaginal bleeding, contractions, leaking of fluid and fetal movement were reviewed in detail with the patient. Please refer to After Visit Summary for other counseling recommendations.  Return in about 2 weeks (around 04/26/2018).  Future Appointments  Date Time Provider Department Center  05/05/2018  3:15 PM [facility] US 4 [facility]-US    Allie BossierMyra C Zeniyah Peaster, MD

## 2018-04-25 ENCOUNTER — Other Ambulatory Visit: Payer: Self-pay | Admitting: Obstetrics & Gynecology

## 2018-04-26 ENCOUNTER — Ambulatory Visit (INDEPENDENT_AMBULATORY_CARE_PROVIDER_SITE_OTHER): Payer: PRIVATE HEALTH INSURANCE | Admitting: Obstetrics & Gynecology

## 2018-04-26 VITALS — BP 122/74 | HR 104 | Wt 229.8 lb

## 2018-04-26 DIAGNOSIS — O0993 Supervision of high risk pregnancy, unspecified, third trimester: Secondary | ICD-10-CM

## 2018-04-26 DIAGNOSIS — Z23 Encounter for immunization: Secondary | ICD-10-CM | POA: Diagnosis not present

## 2018-04-26 DIAGNOSIS — O0990 Supervision of high risk pregnancy, unspecified, unspecified trimester: Secondary | ICD-10-CM

## 2018-04-26 DIAGNOSIS — O99213 Obesity complicating pregnancy, third trimester: Secondary | ICD-10-CM

## 2018-04-26 DIAGNOSIS — D6861 Antiphospholipid syndrome: Secondary | ICD-10-CM

## 2018-04-26 DIAGNOSIS — O24113 Pre-existing diabetes mellitus, type 2, in pregnancy, third trimester: Secondary | ICD-10-CM

## 2018-04-26 DIAGNOSIS — O99210 Obesity complicating pregnancy, unspecified trimester: Secondary | ICD-10-CM

## 2018-04-26 DIAGNOSIS — O09523 Supervision of elderly multigravida, third trimester: Secondary | ICD-10-CM

## 2018-04-26 DIAGNOSIS — O10919 Unspecified pre-existing hypertension complicating pregnancy, unspecified trimester: Secondary | ICD-10-CM

## 2018-04-26 DIAGNOSIS — O10913 Unspecified pre-existing hypertension complicating pregnancy, third trimester: Secondary | ICD-10-CM

## 2018-04-26 DIAGNOSIS — O24112 Pre-existing diabetes mellitus, type 2, in pregnancy, second trimester: Secondary | ICD-10-CM

## 2018-04-26 NOTE — Addendum Note (Signed)
Addended by: Kathie DikeSOLA, Nivek Powley J on: 04/26/2018 02:23 PM   Modules accepted: Orders

## 2018-04-26 NOTE — Progress Notes (Signed)
   PRENATAL VISIT NOTE  Subjective:  Kelli Miller is a 37 y.o. G4P0030 at 2250w1d being seen today for ongoing prenatal care.  She is currently monitored for the following issues for this high-risk pregnancy and has Supervision of high risk pregnancy, antepartum; Pre-existing type 2 diabetes mellitus in pregnancy in second trimester; Hypothyroidism affecting pregnancy, antepartum; Advanced maternal age in multigravida; Obesity in pregnancy; Chronic hypertension in pregnancy; Abnormal antenatal test; PCOS (polycystic ovarian syndrome); GERD (gastroesophageal reflux disease); Psoriasis; Recurrent pregnancy loss; Antiphospholipid antibody syndrome (HCC); Elevated TSH; and Hypomenorrhea/oligomenorrhea on their problem list.  Patient reports no complaints.  Contractions: Not present. Vag. Bleeding: None.  Movement: (!) Decreased. Denies leaking of fluid.   The following portions of the patient's history were reviewed and updated as appropriate: allergies, current medications, past family history, past medical history, past social history, past surgical history and problem list. Problem list updated.  Objective:   Vitals:   04/26/18 1353  BP: 122/74  Pulse: (!) 104  Weight: 229 lb 12.8 oz (104.2 kg)    Fetal Status: Fetal Heart Rate (bpm): 147   Movement: (!) Decreased     General:  Alert, oriented and cooperative. Patient is in no acute distress.  Skin: Skin is warm and dry. No rash noted.   Cardiovascular: Normal heart rate noted  Respiratory: Normal respiratory effort, no problems with respiration noted  Abdomen: Soft, gravid, appropriate for gestational age.  Pain/Pressure: Absent     Pelvic: Cervical exam deferred        Extremities: Normal range of motion.  Edema: Trace  Mental Status: Normal mood and affect. Normal behavior. Normal judgment and thought content.   Assessment and Plan:  Pregnancy: G4P0030 at 850w1d  1. Supervision of high risk pregnancy, antepartum  - HIV antibody -  RPR - CBC - TSH  2. Pre-existing type 2 diabetes mellitus in pregnancy in second trimester - sugars are almost all normal. Her endocrinologist recently added 4 units of Novolog  3. Obesity in pregnancy   4. Chronic hypertension in pregnancy - on labetolol and baby asa  5. Antiphospholipid antibody syndrome (HCC) - on lovenox  6. Multigravida of advanced maternal age in third trimester   Preterm labor symptoms and general obstetric precautions including but not limited to vaginal bleeding, contractions, leaking of fluid and fetal movement were reviewed in detail with the patient. Please refer to After Visit Summary for other counseling recommendations.  No follow-ups on file.  Future Appointments  Date Time Provider Department Center  05/05/2018  3:15 PM [facility] US 4 [facility]-US    Allie BossierMyra C Meghen Akopyan, MD

## 2018-04-27 LAB — CBC
HCT: 33.4 % — ABNORMAL LOW (ref 35.0–45.0)
Hemoglobin: 11.4 g/dL — ABNORMAL LOW (ref 11.7–15.5)
MCH: 32.1 pg (ref 27.0–33.0)
MCHC: 34.1 g/dL (ref 32.0–36.0)
MCV: 94.1 fL (ref 80.0–100.0)
MPV: 9.6 fL (ref 7.5–12.5)
PLATELETS: 241 10*3/uL (ref 140–400)
RBC: 3.55 10*6/uL — ABNORMAL LOW (ref 3.80–5.10)
RDW: 12.5 % (ref 11.0–15.0)
WBC: 12.6 10*3/uL — ABNORMAL HIGH (ref 3.8–10.8)

## 2018-04-27 LAB — TSH: TSH: 1.49 mIU/L

## 2018-04-27 LAB — HIV ANTIBODY (ROUTINE TESTING W REFLEX): HIV: NONREACTIVE

## 2018-04-27 LAB — RPR: RPR: NONREACTIVE

## 2018-05-05 ENCOUNTER — Other Ambulatory Visit (HOSPITAL_COMMUNITY): Payer: Self-pay | Admitting: Maternal and Fetal Medicine

## 2018-05-05 ENCOUNTER — Encounter (HOSPITAL_COMMUNITY): Payer: Self-pay

## 2018-05-05 ENCOUNTER — Ambulatory Visit (HOSPITAL_COMMUNITY)
Admission: RE | Admit: 2018-05-05 | Discharge: 2018-05-05 | Disposition: A | Discharge status: Home / Self Care | Payer: PRIVATE HEALTH INSURANCE | Source: Ambulatory Visit | Attending: Obstetrics and Gynecology | Admitting: Obstetrics and Gynecology

## 2018-05-05 DIAGNOSIS — O10013 Pre-existing essential hypertension complicating pregnancy, third trimester: Secondary | ICD-10-CM | POA: Insufficient documentation

## 2018-05-05 DIAGNOSIS — O99213 Obesity complicating pregnancy, third trimester: Secondary | ICD-10-CM

## 2018-05-05 DIAGNOSIS — O99283 Endocrine, nutritional and metabolic diseases complicating pregnancy, third trimester: Secondary | ICD-10-CM

## 2018-05-05 DIAGNOSIS — D6861 Antiphospholipid syndrome: Secondary | ICD-10-CM | POA: Insufficient documentation

## 2018-05-05 DIAGNOSIS — O99113 Other diseases of the blood and blood-forming organs and certain disorders involving the immune mechanism complicating pregnancy, third trimester: Secondary | ICD-10-CM | POA: Diagnosis not present

## 2018-05-05 DIAGNOSIS — E039 Hypothyroidism, unspecified: Secondary | ICD-10-CM

## 2018-05-05 DIAGNOSIS — O24313 Unspecified pre-existing diabetes mellitus in pregnancy, third trimester: Secondary | ICD-10-CM

## 2018-05-05 DIAGNOSIS — O99119 Other diseases of the blood and blood-forming organs and certain disorders involving the immune mechanism complicating pregnancy, unspecified trimester: Secondary | ICD-10-CM

## 2018-05-05 DIAGNOSIS — O99280 Endocrine, nutritional and metabolic diseases complicating pregnancy, unspecified trimester: Secondary | ICD-10-CM

## 2018-05-05 DIAGNOSIS — Z3A30 30 weeks gestation of pregnancy: Secondary | ICD-10-CM | POA: Diagnosis not present

## 2018-05-05 DIAGNOSIS — O10919 Unspecified pre-existing hypertension complicating pregnancy, unspecified trimester: Secondary | ICD-10-CM

## 2018-05-05 DIAGNOSIS — O289 Unspecified abnormal findings on antenatal screening of mother: Secondary | ICD-10-CM

## 2018-05-05 DIAGNOSIS — O09523 Supervision of elderly multigravida, third trimester: Secondary | ICD-10-CM | POA: Insufficient documentation

## 2018-05-05 DIAGNOSIS — Z362 Encounter for other antenatal screening follow-up: Secondary | ICD-10-CM | POA: Insufficient documentation

## 2018-05-05 DIAGNOSIS — O0990 Supervision of high risk pregnancy, unspecified, unspecified trimester: Secondary | ICD-10-CM

## 2018-05-05 DIAGNOSIS — O24112 Pre-existing diabetes mellitus, type 2, in pregnancy, second trimester: Secondary | ICD-10-CM

## 2018-05-05 IMAGING — US US MFM OB FOLLOW-UP
1 series · 13 of 28 positions shown · non-contrast
Comparison: none

[Series 1: us mfm ob follow-up · 13 of 62 slices shown]
[im 3/62]
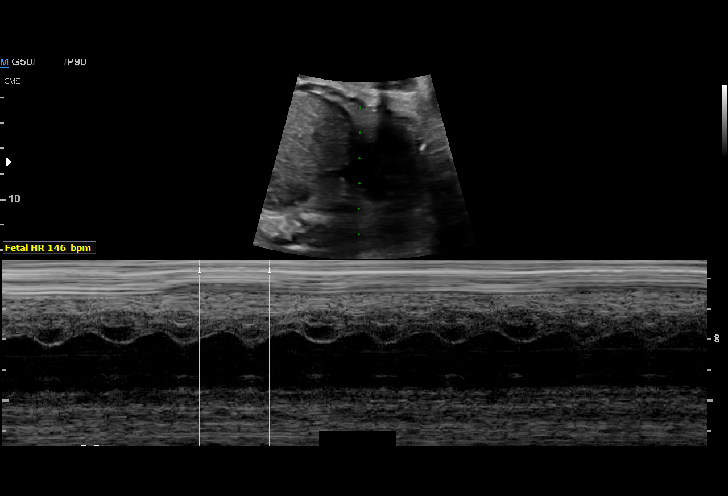
[im 7/62]
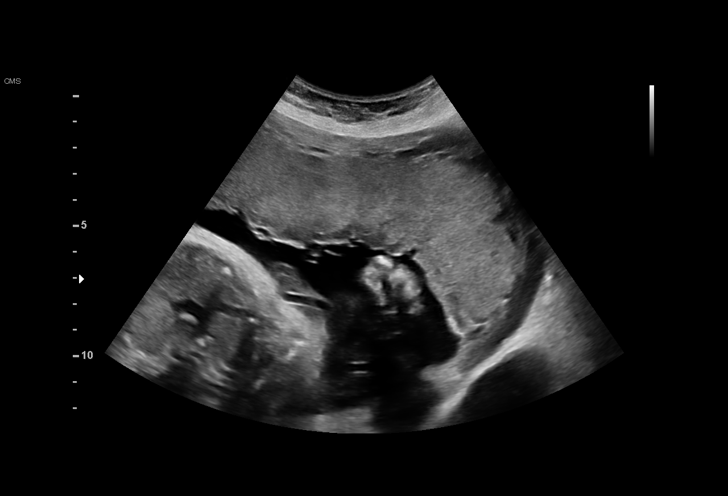
[im 12/62]
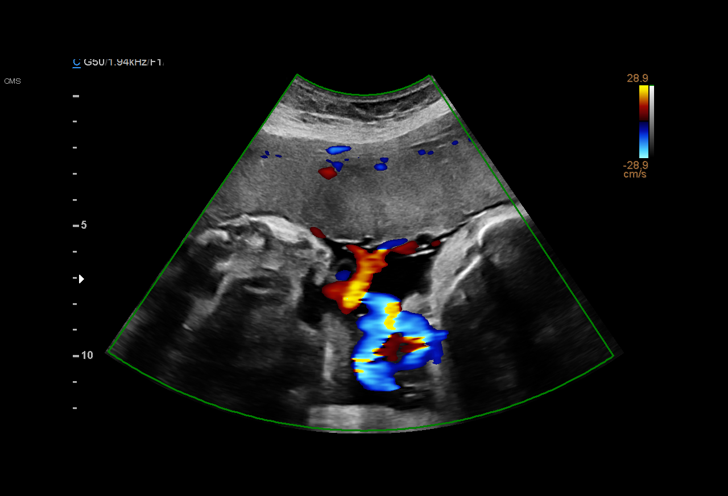
[im 16/62]
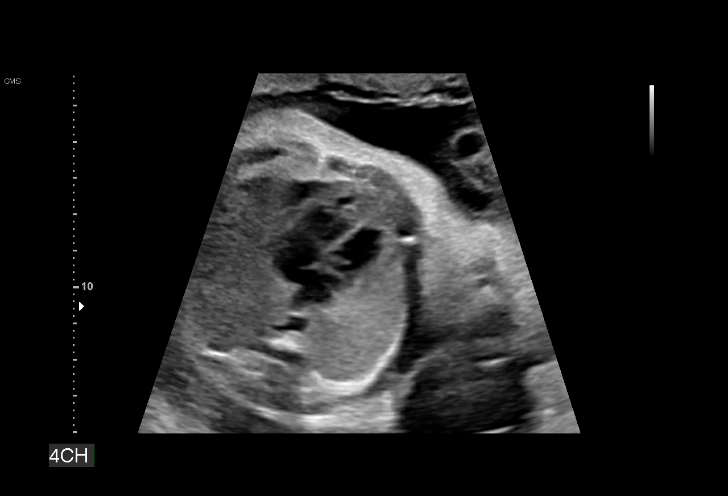
[im 21/62]
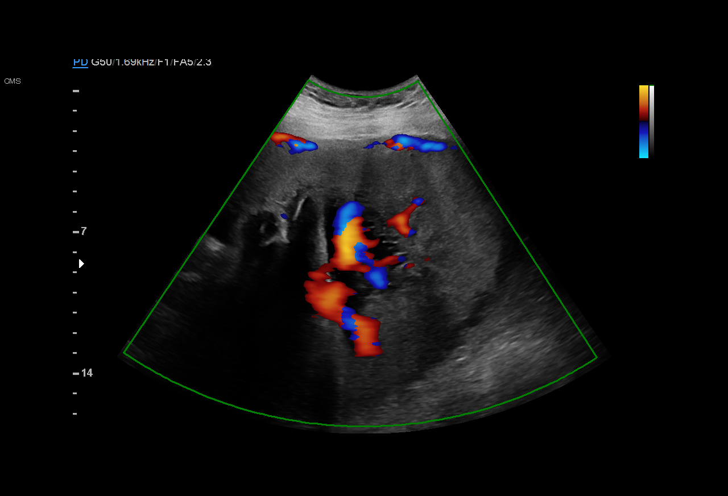
[im 25/62]
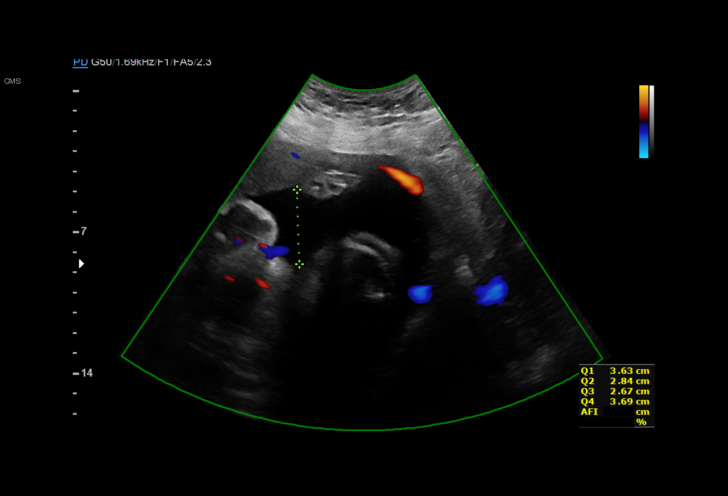
[im 32/62]
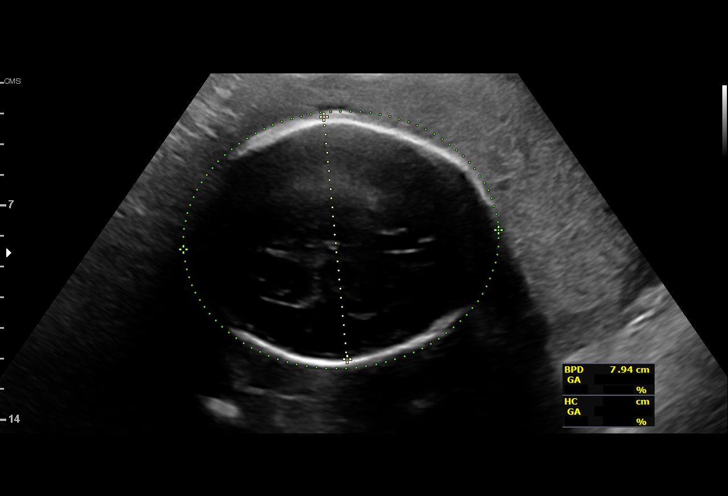
[im 37/62]
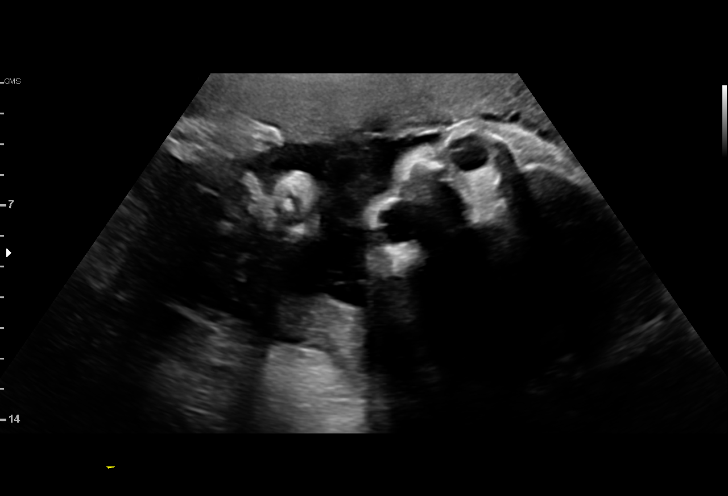
[im 41/62]
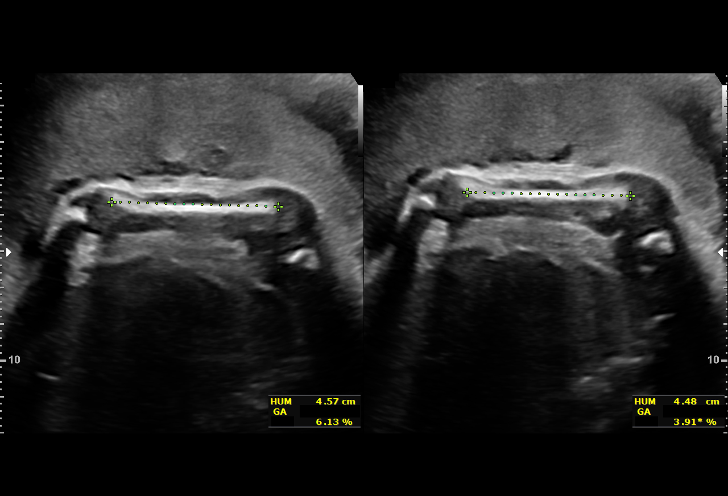
[im 46/62]
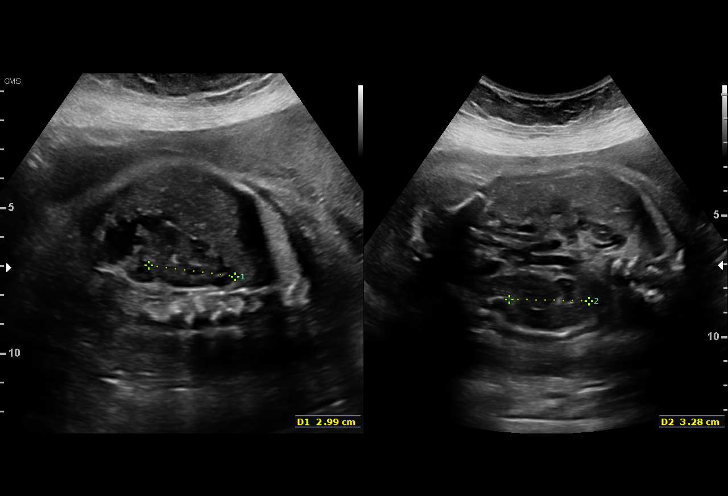
[im 50/62]
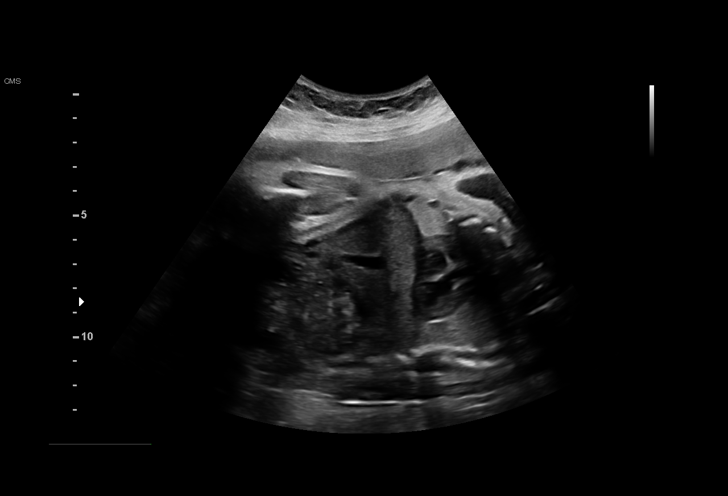
[im 55/62]
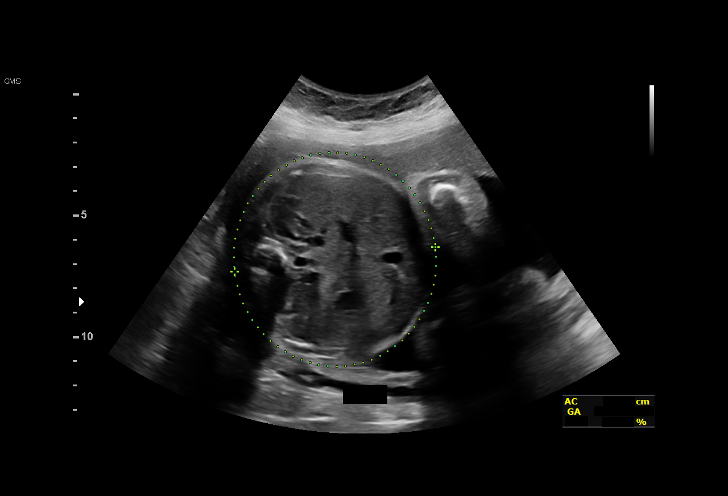
[im 59/62]
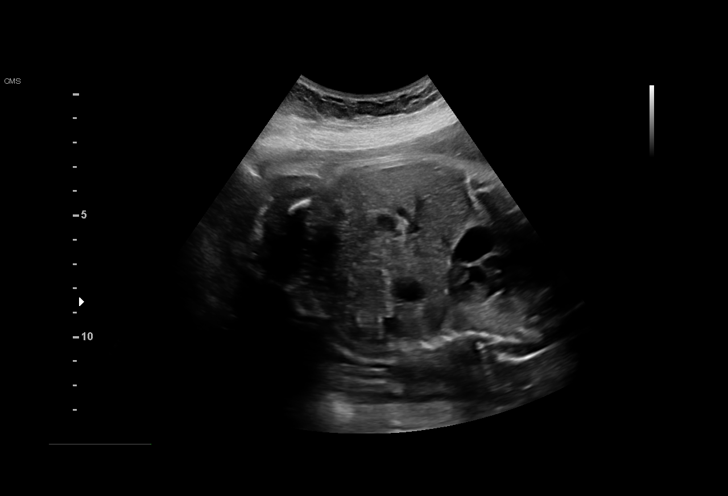

[13 of 28 positions shown; findings below may reference images not displayed]

OB/Gyn Clinic

1  SADIYA            [PHONE_NUMBER]      [PHONE_NUMBER]     [PHONE_NUMBER]
Indications

30 weeks gestation of pregnancy
Diabetes - Pregestational,3rd trimester        [WA]
(insulin)
Advanced maternal age multigravida 35+,        [WA]
third trimester (low FF NIPS; declined
additional testing
Obesity complicating pregnancy, third          [WA]
trimester
Hypothyroid (synthroid)                        [WA] [WA]
Hypertension - Chronic/Pre-existing            [WA]
(labetalol)
Antiphospholipid syndrome complicating         O99.119, [WA]
pregnancy, antepartum (Lovenox)
Encounter for other antenatal screening        [WA]
follow-up
OB History

Blood Type:            Height:  5'1"   Weight (lb):  212       BMI:
Gravidity:    4          SAB:   3
Living:       0
Fetal Evaluation

Num Of Fetuses:     1
Fetal Heart         146
Rate(bpm):
Cardiac Activity:   Observed
Presentation:       Cephalic
Placenta:           Anterior, above cervical os
P. Cord Insertion:  Visualized

Amniotic Fluid
AFI FV:      Subjectively within normal limits

AFI Sum(cm)     %Tile       Largest Pocket(cm)
12.71           36

RUQ(cm)       RLQ(cm)       LUQ(cm)        LLQ(cm)
3.93
Biometry

BPD:      79.3  mm     G. Age:  31w 6d         80  %    CI:        75.87   %    70 - 86
FL/HC:      18.8   %    19.2 -
HC:      288.6  mm     G. Age:  31w 5d         52  %    HC/AC:      1.10        0.99 -
AC:      263.5  mm     G. Age:  30w 3d         47  %    FL/BPD:     68.3   %    71 - 87
FL:       54.2  mm     G. Age:  28w 5d          5  %    FL/AC:      20.6   %    20 - 24
HUM:      45.9  mm     G. Age:  27w 0d        < 5  %

Est. FW:    [WA]  gm      3 lb 5 oz     48  %
Gestational Age

LMP:           30w 3d        Date:  [DATE]                 EDD:   [DATE]
U/S Today:     30w 5d                                        EDD:   [DATE]
Best:          30w 3d     Det. By:  LMP  ([DATE])          EDD:   [DATE]
Anatomy

Cranium:               Appears normal         Aortic Arch:            Previously seen
Cavum:                 Appears normal         Ductal Arch:            Previously seen
Ventricles:            Appears normal         Diaphragm:              Appears normal
Choroid Plexus:        Previously seen        Stomach:                Appears normal, left
sided
Cerebellum:            Previously seen        Abdomen:                Appears normal
Posterior Fossa:       Previously seen        Abdominal Wall:         Previously seen
Nuchal Fold:           Not applicable (>20    Cord Vessels:           Appears normal (3
wks GA)                                        vessel cord)
Face:                  Appears normal         Kidneys:                Appear normal
(orbits and profile)
Lips:                  Appears normal         Bladder:                Appears normal
Thoracic:              Appears normal         Spine:                  Previously seen
Heart:                 Appears normal         Upper Extremities:      Previously seen
(4CH, axis, and
situs)
RVOT:                  Previously seen        Lower Extremities:      Previously seen
LVOT:                  Previously seen

Other:  Fetus appears to be a female. Heels and LT 5th digit previously
visualized. Nasal bone visualized. Technically difficult due to maternal
habitus and fetal position.
Cervix Uterus Adnexa

Cervix
Not visualized (advanced GA >[WA])
Uterus
No abnormality visualized.

Left Ovary
Within normal limits.

Right Ovary
Not visualized.

Cul De Sac:   No free fluid seen.

Adnexa:       No abnormality visualized.
Impression

Chronic hypertension: Contolled on labetalol.
Pregestational diabetes: Patient reports her diabetes is well
controlled.
Hypothyroidism: On thyroid supplements.
Antiphospholipid syndrome: Patient is taking lovenox
prophylaxis.
AMA: Cell-free fetal DNA screening is inconclusive because
of low fetal fraction (high risk for trisomies 13 and 18). Patient
opted not to have amniocentesis.

Fetal growth is appropriate for gestational age. Amniotic fluid
is normal and good fetal activity is seen.

I reassured the patient of the finding. Patient has multiple
high-risk pregnancy conditions. I counseled her on the
importance of weekly antenatal testing from 32 weeks'
gestation till delivery.
Recommendations

-Weekly antenatal testing from 32 weeks.
-Delivery may be considered between 37 and 39 weeks.

## 2018-05-06 ENCOUNTER — Other Ambulatory Visit (HOSPITAL_COMMUNITY): Payer: Self-pay | Admitting: *Deleted

## 2018-05-06 DIAGNOSIS — O24119 Pre-existing diabetes mellitus, type 2, in pregnancy, unspecified trimester: Secondary | ICD-10-CM

## 2018-05-06 DIAGNOSIS — Z794 Long term (current) use of insulin: Principal | ICD-10-CM

## 2018-05-10 ENCOUNTER — Ambulatory Visit (INDEPENDENT_AMBULATORY_CARE_PROVIDER_SITE_OTHER): Payer: PRIVATE HEALTH INSURANCE | Admitting: Obstetrics & Gynecology

## 2018-05-10 VITALS — BP 112/71 | HR 90 | Wt 233.0 lb

## 2018-05-10 DIAGNOSIS — N898 Other specified noninflammatory disorders of vagina: Secondary | ICD-10-CM | POA: Diagnosis not present

## 2018-05-10 DIAGNOSIS — O10919 Unspecified pre-existing hypertension complicating pregnancy, unspecified trimester: Secondary | ICD-10-CM

## 2018-05-10 DIAGNOSIS — O0990 Supervision of high risk pregnancy, unspecified, unspecified trimester: Secondary | ICD-10-CM

## 2018-05-10 DIAGNOSIS — D6861 Antiphospholipid syndrome: Secondary | ICD-10-CM

## 2018-05-10 DIAGNOSIS — O24112 Pre-existing diabetes mellitus, type 2, in pregnancy, second trimester: Secondary | ICD-10-CM

## 2018-05-10 DIAGNOSIS — Z113 Encounter for screening for infections with a predominantly sexual mode of transmission: Secondary | ICD-10-CM

## 2018-05-10 DIAGNOSIS — O26893 Other specified pregnancy related conditions, third trimester: Secondary | ICD-10-CM | POA: Diagnosis not present

## 2018-05-10 DIAGNOSIS — R7989 Other specified abnormal findings of blood chemistry: Secondary | ICD-10-CM

## 2018-05-10 DIAGNOSIS — E039 Hypothyroidism, unspecified: Secondary | ICD-10-CM

## 2018-05-10 DIAGNOSIS — O09523 Supervision of elderly multigravida, third trimester: Secondary | ICD-10-CM

## 2018-05-10 DIAGNOSIS — O99280 Endocrine, nutritional and metabolic diseases complicating pregnancy, unspecified trimester: Secondary | ICD-10-CM

## 2018-05-10 MED ORDER — IRON (FERROUS GLUCONATE) 256 (28 FE) MG PO TABS: ORAL_TABLET | ORAL | 3 refills | Status: DC

## 2018-05-10 NOTE — Progress Notes (Signed)
   PRENATAL VISIT NOTE  Subjective:  Kelli Miller is a 37 y.o. G4P0030 at 5668w1d being seen today for ongoing prenatal care.  She is currently monitored for the following issues for this high-risk pregnancy and has Supervision of high risk pregnancy, antepartum; Pre-existing type 2 diabetes mellitus in pregnancy in second trimester; Hypothyroidism affecting pregnancy, antepartum; Advanced maternal age in multigravida; Obesity in pregnancy; Chronic hypertension in pregnancy; Abnormal antenatal test; PCOS (polycystic ovarian syndrome); GERD (gastroesophageal reflux disease); Psoriasis; Recurrent pregnancy loss; Antiphospholipid antibody syndrome (HCC); Elevated TSH; and Hypomenorrhea/oligomenorrhea on their problem list.  Patient reports no complaints.  Contractions: Not present. Vag. Bleeding: None.  Movement: Present. Denies leaking of fluid.   The following portions of the patient's history were reviewed and updated as appropriate: allergies, current medications, past family history, past medical history, past social history, past surgical history and problem list. Problem list updated.  Objective:   Vitals:   05/10/18 1350  BP: 112/71  Pulse: 90  Weight: 233 lb (105.7 kg)    Fetal Status: Fetal Heart Rate (bpm): 139   Movement: Present  Presentation: Transverse  General:  Alert, oriented and cooperative. Patient is in no acute distress.  Skin: Skin is warm and dry. No rash noted.   Cardiovascular: Normal heart rate noted  Respiratory: Normal respiratory effort, no problems with respiration noted  Abdomen: Soft, gravid, appropriate for gestational age.  Pain/Pressure: Absent     Pelvic: Cervical exam deferred        Extremities: Normal range of motion.  Edema: Trace  Mental Status: Normal mood and affect. Normal behavior. Normal judgment and thought content.   Assessment and Plan:  Pregnancy: G4P0030 at 6968w1d  1. Multigravida of advanced maternal age in third trimester   2.  Antiphospholipid antibody syndrome (HCC) - on lovenox  3. Chronic hypertension in pregnancy - doing well with labetolol 200 mg BID  4. Elevated TSH - recent TSH good  5. Hypothyroidism affecting pregnancy, antepartum   6. Pre-existing type 2 diabetes mellitus in pregnancy in second trimester - fasting sugars elevated, so I will increase her qhs NPH from 72 to 76  7. Supervision of high risk pregnancy, antepartum - start testing next week with MFM -MFM u/s ordered next week 8. Flagyl helped her green vaginal discharge - self swab today  Preterm labor symptoms and general obstetric precautions including but not limited to vaginal bleeding, contractions, leaking of fluid and fetal movement were reviewed in detail with the patient. Please refer to After Visit Summary for other counseling recommendations.  No follow-ups on file.  Future Appointments  Date Time Provider Department Center  05/19/2018  1:00 PM [facility] US 3 [facility]    Allie BossierMyra C Asuzena Weis, MD

## 2018-05-11 LAB — CERVICOVAGINAL ANCILLARY ONLY
Bacterial vaginitis: NEGATIVE
CANDIDA VAGINITIS: NEGATIVE
Chlamydia: NEGATIVE
Neisseria Gonorrhea: NEGATIVE
Trichomonas: POSITIVE — AB

## 2018-05-12 ENCOUNTER — Telehealth: Payer: Self-pay

## 2018-05-12 DIAGNOSIS — A599 Trichomoniasis, unspecified: Secondary | ICD-10-CM

## 2018-05-12 MED ORDER — METRONIDAZOLE 500 MG PO TABS: 500.0000 mg | ORAL_TABLET | Freq: Two times a day (BID) | ORAL | 0 refills | Status: DC

## 2018-05-12 NOTE — Telephone Encounter (Signed)
Called pt to let her know of positive trichomonas results. Pt did not answer so I left a message on her voicemail letting her know we have results for her and I provided office phone number.

## 2018-05-12 NOTE — Telephone Encounter (Signed)
Pt returned my call and she is aware of positive trichomonas results. I advised pt that her partner needs to be treated as well. PT is aware of Rx (per protocol book: Flagyl 2 grams PO 1 dose)  being sent to pharmacy.

## 2018-05-19 ENCOUNTER — Encounter (HOSPITAL_COMMUNITY): Payer: Self-pay

## 2018-05-19 ENCOUNTER — Ambulatory Visit (HOSPITAL_COMMUNITY)
Admission: RE | Admit: 2018-05-19 | Discharge: 2018-05-19 | Disposition: A | Discharge status: Home / Self Care | Payer: PRIVATE HEALTH INSURANCE | Source: Ambulatory Visit | Attending: Obstetrics & Gynecology | Admitting: Obstetrics & Gynecology

## 2018-05-19 DIAGNOSIS — O09523 Supervision of elderly multigravida, third trimester: Secondary | ICD-10-CM | POA: Diagnosis not present

## 2018-05-19 DIAGNOSIS — O99213 Obesity complicating pregnancy, third trimester: Secondary | ICD-10-CM | POA: Diagnosis not present

## 2018-05-19 DIAGNOSIS — Z794 Long term (current) use of insulin: Secondary | ICD-10-CM | POA: Diagnosis present

## 2018-05-19 DIAGNOSIS — O24113 Pre-existing diabetes mellitus, type 2, in pregnancy, third trimester: Secondary | ICD-10-CM | POA: Diagnosis not present

## 2018-05-19 DIAGNOSIS — O0990 Supervision of high risk pregnancy, unspecified, unspecified trimester: Secondary | ICD-10-CM

## 2018-05-19 DIAGNOSIS — O99280 Endocrine, nutritional and metabolic diseases complicating pregnancy, unspecified trimester: Secondary | ICD-10-CM

## 2018-05-19 DIAGNOSIS — O10919 Unspecified pre-existing hypertension complicating pregnancy, unspecified trimester: Secondary | ICD-10-CM

## 2018-05-19 DIAGNOSIS — O24112 Pre-existing diabetes mellitus, type 2, in pregnancy, second trimester: Secondary | ICD-10-CM

## 2018-05-19 DIAGNOSIS — O289 Unspecified abnormal findings on antenatal screening of mother: Secondary | ICD-10-CM

## 2018-05-19 DIAGNOSIS — O24119 Pre-existing diabetes mellitus, type 2, in pregnancy, unspecified trimester: Secondary | ICD-10-CM

## 2018-05-19 DIAGNOSIS — Z3A32 32 weeks gestation of pregnancy: Secondary | ICD-10-CM

## 2018-05-19 DIAGNOSIS — E039 Hypothyroidism, unspecified: Secondary | ICD-10-CM

## 2018-05-19 IMAGING — US US MFM FETAL BPP W/O NON-STRESS
1 series · 12 of 14 positions shown · non-contrast
Comparison: none

[Series 1: us mfm fetal bpp w/o non-stress · 14 acquisitions, 12 frames shown]
[im 1/14]
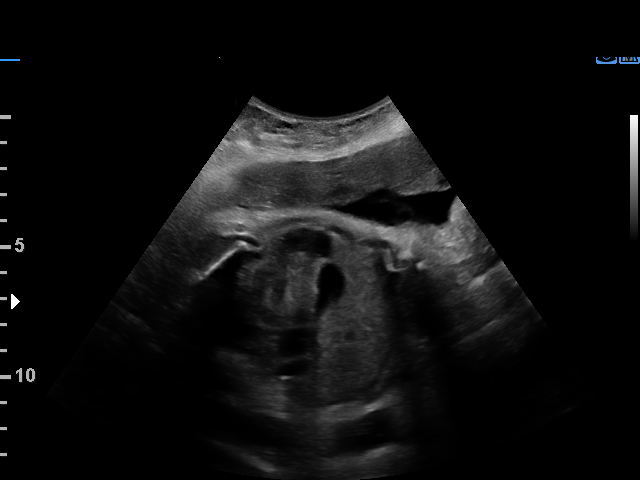
[im 2/14]
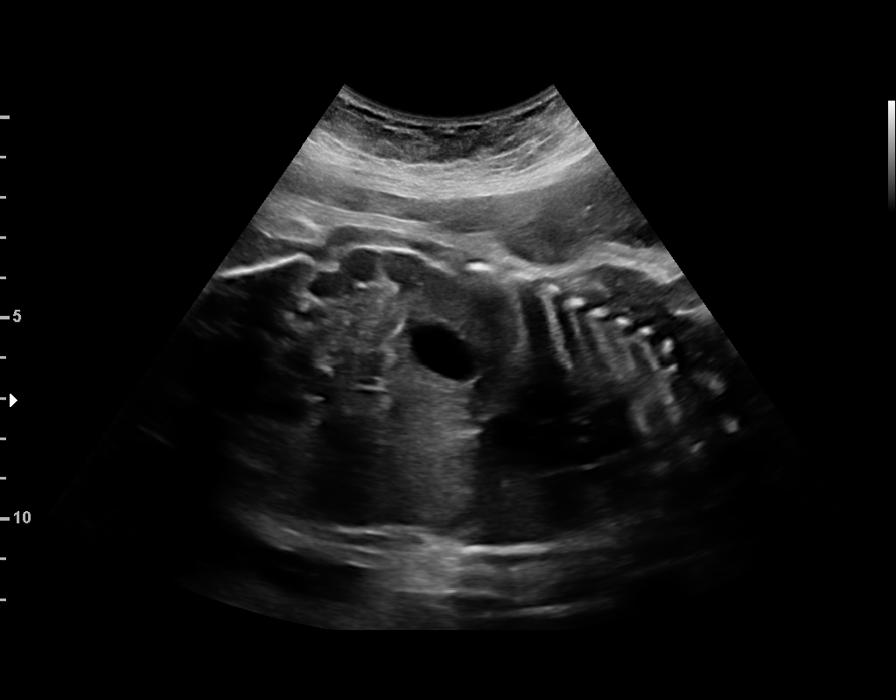
[im 3/14]
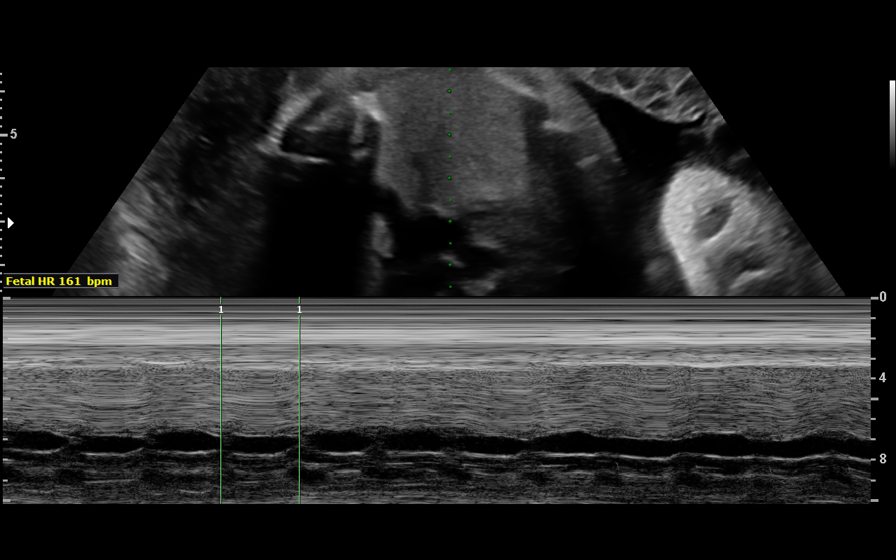
[im 5/14]
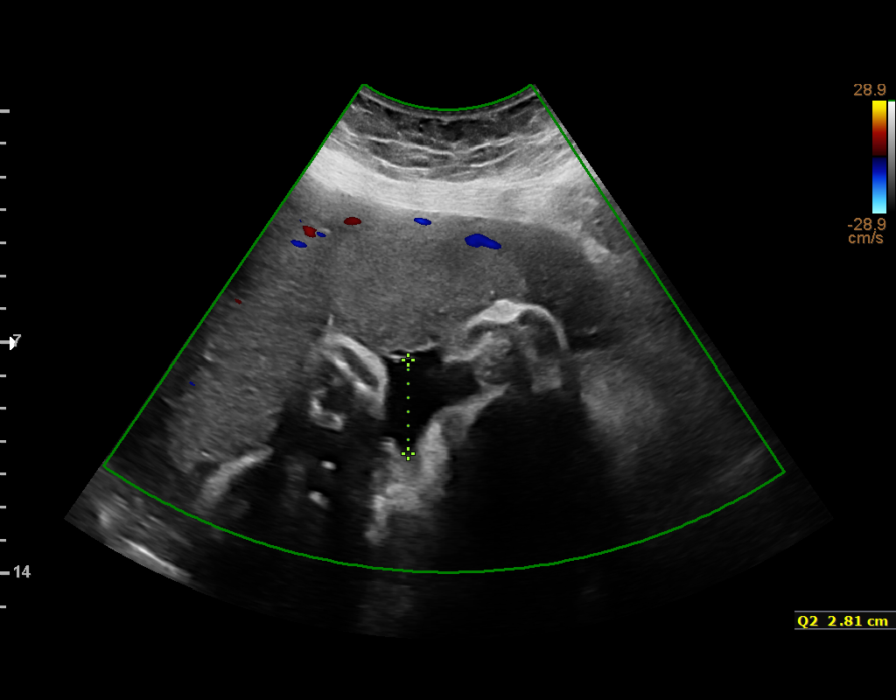
[im 6/14]
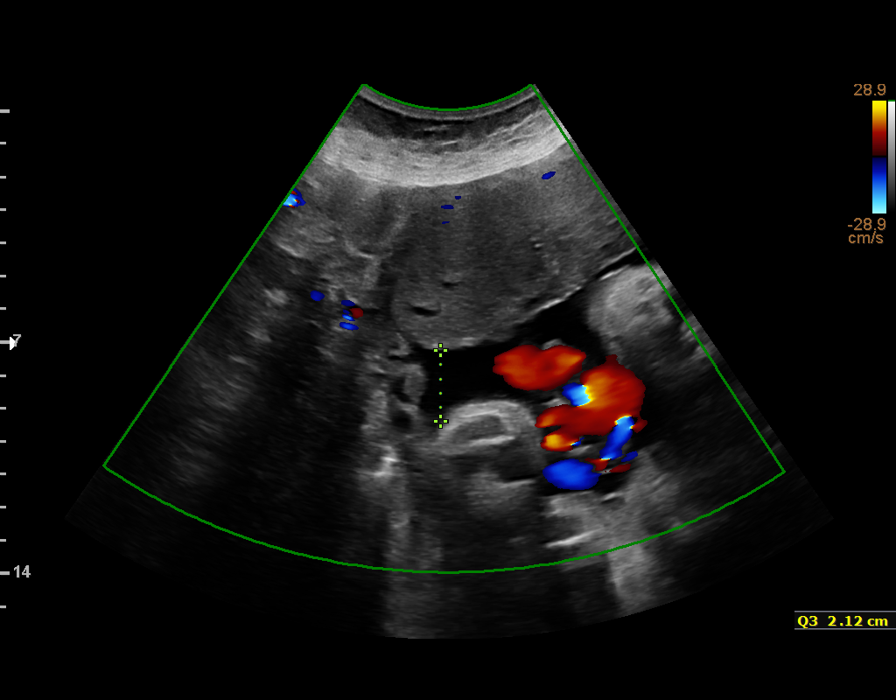
[im 7/14]
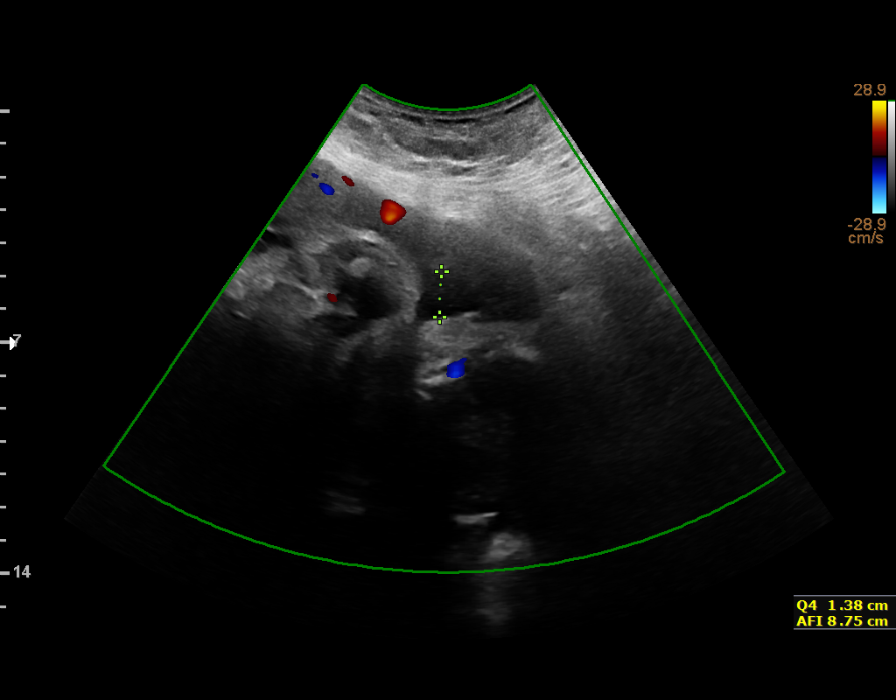
[im 8/14]
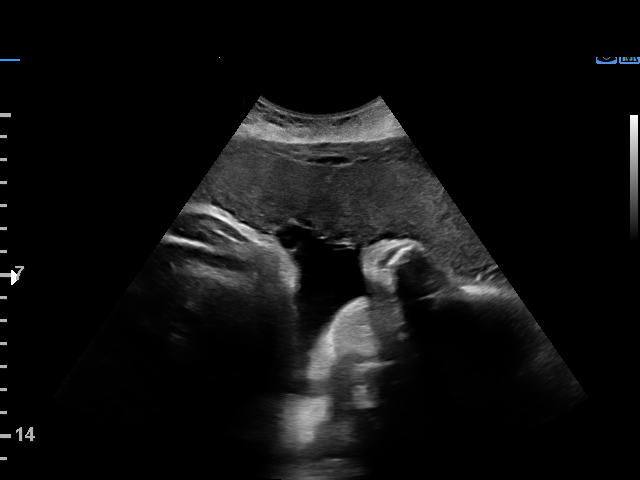
[im 9/14]
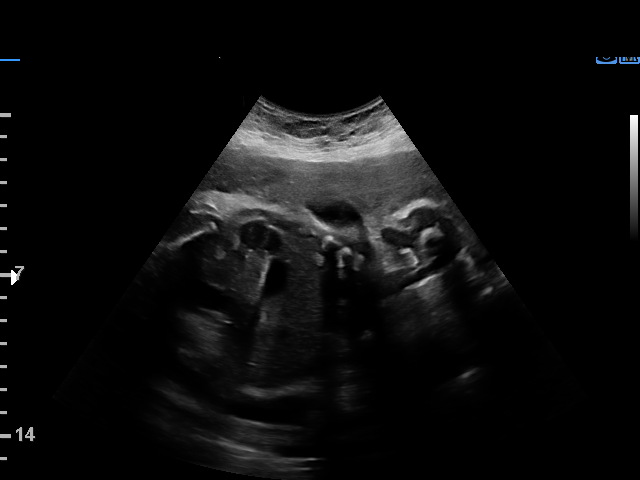
[im 10/14]
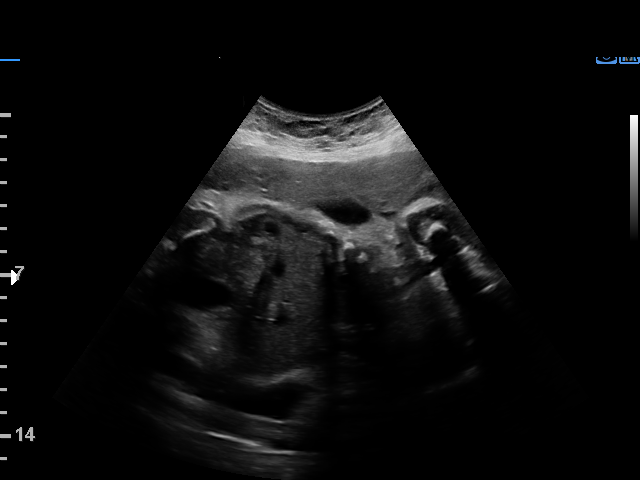
[im 12/14]
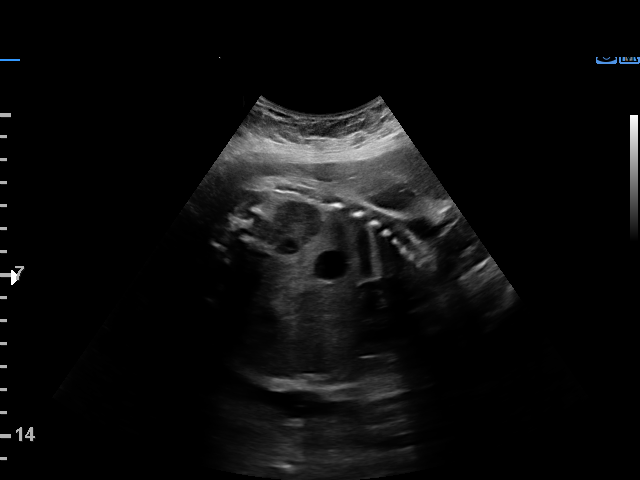
[im 13/14]
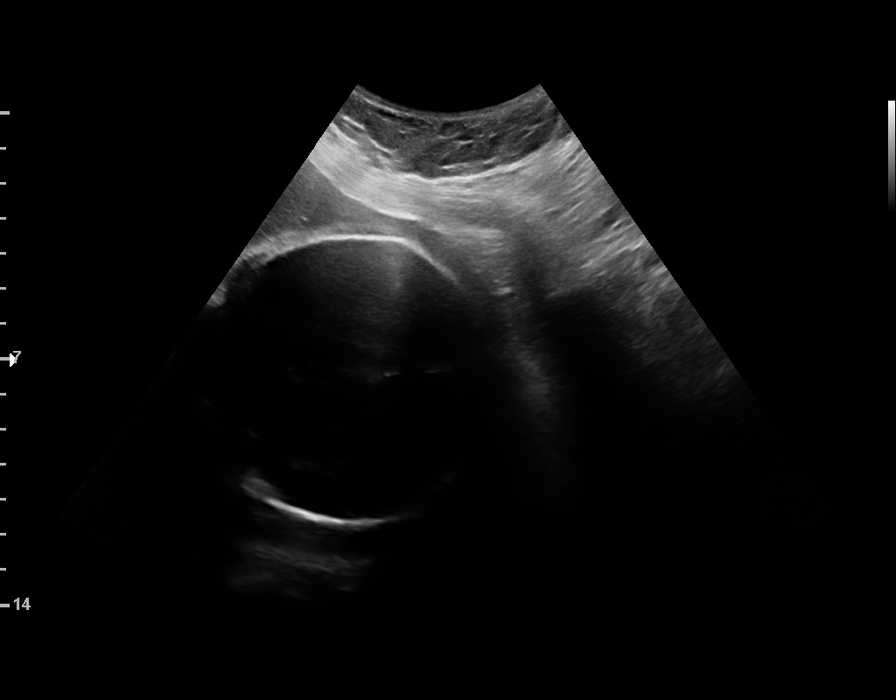
[im 14/14]
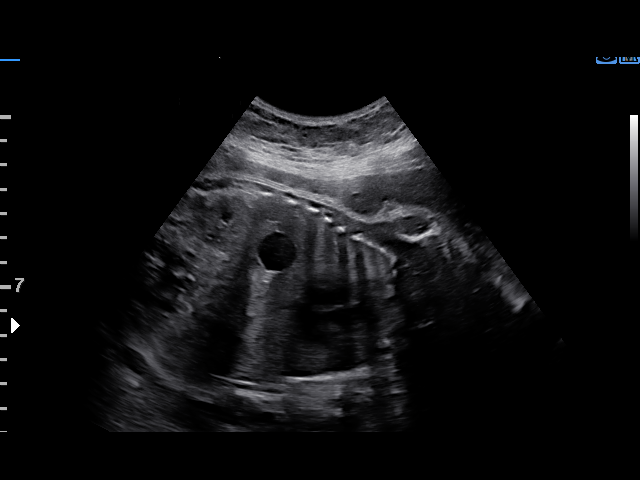

[12 of 14 positions shown; findings below may reference images not displayed]

OB/Gyn Clinic

Indications

32 weeks gestation of pregnancy
Diabetes - Pregestational,3rd trimester        [6D]
(insulin)
Advanced maternal age multigravida 35+,        [6D]
third trimester (low FF NIPS; declined
additional testing
Obesity complicating pregnancy, third          [6D]
trimester
Hypothyroid (synthroid)                        [6D] [6D]
Hypertension - Chronic/Pre-existing            [6D]
(labetalol)
Antiphospholipid syndrome complicating         O99.119, [6D]
pregnancy, antepartum (Lovenox)
OB History

Blood Type:            Height:  5'1"   Weight (lb):  212       BMI:
Gravidity:    4          SAB:   3
Living:       0
Fetal Evaluation

Num Of Fetuses:     1
Fetal Heart         161
Rate(bpm):
Cardiac Activity:   Observed
Presentation:       Cephalic
Amniotic Fluid
AFI FV:      Subjectively low-normal

AFI Sum(cm)     %Tile       Largest Pocket(cm)
8.74            7

RUQ(cm)       RLQ(cm)       LUQ(cm)        LLQ(cm)
2.43
Biophysical Evaluation

Amniotic F.V:   Within normal limits       F. Tone:        Observed
F. Movement:    Observed                   Score:          [DATE]
F. Breathing:   Observed
Gestational Age

LMP:           32w 3d        Date:  [DATE]                 EDD:   [DATE]
Best:          32w 3d     Det. By:  LMP  ([DATE])          EDD:   [DATE]
Impression

Amniotic fluid is normal and good fetal activity is seen.
BPP [DATE].

BP at our office: 126/77 mm Hg.
Recommendations

Continue weekly antenatal testing till delivery.

## 2018-05-20 ENCOUNTER — Other Ambulatory Visit (HOSPITAL_COMMUNITY): Payer: Self-pay | Admitting: *Deleted

## 2018-05-20 DIAGNOSIS — O24119 Pre-existing diabetes mellitus, type 2, in pregnancy, unspecified trimester: Secondary | ICD-10-CM

## 2018-05-20 DIAGNOSIS — Z794 Long term (current) use of insulin: Principal | ICD-10-CM

## 2018-05-24 ENCOUNTER — Ambulatory Visit (INDEPENDENT_AMBULATORY_CARE_PROVIDER_SITE_OTHER): Payer: PRIVATE HEALTH INSURANCE | Admitting: Obstetrics & Gynecology

## 2018-05-24 VITALS — BP 115/70 | HR 92 | Wt 234.0 lb

## 2018-05-24 DIAGNOSIS — D6861 Antiphospholipid syndrome: Secondary | ICD-10-CM

## 2018-05-24 DIAGNOSIS — O24112 Pre-existing diabetes mellitus, type 2, in pregnancy, second trimester: Secondary | ICD-10-CM

## 2018-05-24 DIAGNOSIS — Z113 Encounter for screening for infections with a predominantly sexual mode of transmission: Secondary | ICD-10-CM

## 2018-05-24 DIAGNOSIS — O10919 Unspecified pre-existing hypertension complicating pregnancy, unspecified trimester: Secondary | ICD-10-CM

## 2018-05-24 DIAGNOSIS — Z202 Contact with and (suspected) exposure to infections with a predominantly sexual mode of transmission: Secondary | ICD-10-CM

## 2018-05-24 NOTE — Progress Notes (Signed)
   PRENATAL VISIT NOTE  Subjective:  [name] is a 37 y.o. G4P0030 at 8234w1d being seen today for ongoing prenatal care.  She is currently monitored for the following issues for this high-risk pregnancy and has Supervision of high risk pregnancy, antepartum; Pre-existing type 2 diabetes mellitus in pregnancy in second trimester; Hypothyroidism affecting pregnancy, antepartum; Advanced maternal age in multigravida; Obesity in pregnancy; Chronic hypertension in pregnancy; Abnormal antenatal test; PCOS (polycystic ovarian syndrome); GERD (gastroesophageal reflux disease); Psoriasis; Recurrent pregnancy loss; Antiphospholipid antibody syndrome (HCC); Elevated TSH; and Hypomenorrhea/oligomenorrhea on their problem list.  Patient reports no complaints.  Contractions: Irritability. Vag. Bleeding: None.  Movement: Present. Denies leaking of fluid.   The following portions of the patient's history were reviewed and updated as appropriate: allergies, current medications, past family history, past medical history, past social history, past surgical history and problem list. Problem list updated.  Objective:   Vitals:   05/24/18 1424  BP: 115/70  Pulse: 92  Weight: 234 lb (106.1 kg)    Fetal Status: Fetal Heart Rate (bpm): 144   Movement: Present     General:  Alert, oriented and cooperative. Patient is in no acute distress.  Skin: Skin is warm and dry. No rash noted.   Cardiovascular: Normal heart rate noted  Respiratory: Normal respiratory effort, no problems with respiration noted  Abdomen: Soft, gravid, appropriate for gestational age.  Pain/Pressure: Absent     Pelvic: Cervical exam deferred        Extremities: Normal range of motion.  Edema: Trace  Mental Status: Normal mood and affect. Normal behavior. Normal judgment and thought content.   Assessment and Plan:  Pregnancy: G4P0030 at 6234w1d  1. Chronic hypertension in pregnancy BP well controlled.  In weekly BPP testing and serial  growths.  2. Exposure to STD TOC for treatment of trich.  Pt requested self swab.  - Cervicovaginal ancillary only  3. Antiphospholipid antibody syndrome (HCC) Continue Lovenox  4. Pre-existing type 2 diabetes mellitus in pregnancy in second trimester CBGs well controlled.  In weekly BPP testing and serial growths.  Endocrinologist managing insulin.    Preterm labor symptoms and general obstetric precautions including but not limited to vaginal bleeding, contractions, leaking of fluid and fetal movement were reviewed in detail with the patient. Please refer to After Visit Summary for other counseling recommendations.  Return in about 2 weeks (around 06/07/2018).  Future Appointments  Date Time Provider Department Center  05/27/2018  2:30 PM [facility] US 5 [facility]  06/02/2018  1:45 PM [facility] US 2 [facility]  06/06/2018  8:15 AM Donette LarryBhambri, Melanie, CNM [facility]  06/07/2018  1:45 PM [facility] US 2 [facility]    Elsie LincolnKelly Cloa Bushong, MD

## 2018-05-25 ENCOUNTER — Other Ambulatory Visit: Payer: Self-pay | Admitting: Obstetrics & Gynecology

## 2018-05-25 LAB — CERVICOVAGINAL ANCILLARY ONLY: TRICH (WINDOWPATH): POSITIVE — AB

## 2018-05-26 ENCOUNTER — Encounter: Payer: Self-pay | Admitting: *Deleted

## 2018-05-27 ENCOUNTER — Other Ambulatory Visit (HOSPITAL_COMMUNITY): Payer: Self-pay | Admitting: *Deleted

## 2018-05-27 ENCOUNTER — Ambulatory Visit (HOSPITAL_COMMUNITY)
Admission: RE | Admit: 2018-05-27 | Discharge: 2018-05-27 | Disposition: A | Discharge status: Home / Self Care | Payer: PRIVATE HEALTH INSURANCE | Source: Ambulatory Visit | Attending: Obstetrics & Gynecology | Admitting: Obstetrics & Gynecology

## 2018-05-27 ENCOUNTER — Encounter (HOSPITAL_COMMUNITY): Payer: Self-pay

## 2018-05-27 ENCOUNTER — Other Ambulatory Visit (HOSPITAL_COMMUNITY): Payer: Self-pay | Admitting: Obstetrics and Gynecology

## 2018-05-27 DIAGNOSIS — O99283 Endocrine, nutritional and metabolic diseases complicating pregnancy, third trimester: Secondary | ICD-10-CM | POA: Insufficient documentation

## 2018-05-27 DIAGNOSIS — O99113 Other diseases of the blood and blood-forming organs and certain disorders involving the immune mechanism complicating pregnancy, third trimester: Secondary | ICD-10-CM | POA: Diagnosis not present

## 2018-05-27 DIAGNOSIS — Z794 Long term (current) use of insulin: Secondary | ICD-10-CM | POA: Insufficient documentation

## 2018-05-27 DIAGNOSIS — O10019 Pre-existing essential hypertension complicating pregnancy, unspecified trimester: Secondary | ICD-10-CM

## 2018-05-27 DIAGNOSIS — O24113 Pre-existing diabetes mellitus, type 2, in pregnancy, third trimester: Secondary | ICD-10-CM

## 2018-05-27 DIAGNOSIS — O99280 Endocrine, nutritional and metabolic diseases complicating pregnancy, unspecified trimester: Secondary | ICD-10-CM

## 2018-05-27 DIAGNOSIS — Z79890 Hormone replacement therapy (postmenopausal): Secondary | ICD-10-CM | POA: Diagnosis not present

## 2018-05-27 DIAGNOSIS — O24119 Pre-existing diabetes mellitus, type 2, in pregnancy, unspecified trimester: Secondary | ICD-10-CM

## 2018-05-27 DIAGNOSIS — O99213 Obesity complicating pregnancy, third trimester: Secondary | ICD-10-CM | POA: Diagnosis not present

## 2018-05-27 DIAGNOSIS — O09523 Supervision of elderly multigravida, third trimester: Secondary | ICD-10-CM | POA: Diagnosis not present

## 2018-05-27 DIAGNOSIS — E039 Hypothyroidism, unspecified: Secondary | ICD-10-CM | POA: Diagnosis not present

## 2018-05-27 DIAGNOSIS — Z79899 Other long term (current) drug therapy: Secondary | ICD-10-CM | POA: Insufficient documentation

## 2018-05-27 DIAGNOSIS — D6861 Antiphospholipid syndrome: Secondary | ICD-10-CM | POA: Insufficient documentation

## 2018-05-27 DIAGNOSIS — Z3A33 33 weeks gestation of pregnancy: Secondary | ICD-10-CM | POA: Diagnosis not present

## 2018-05-27 DIAGNOSIS — O10919 Unspecified pre-existing hypertension complicating pregnancy, unspecified trimester: Secondary | ICD-10-CM

## 2018-05-27 DIAGNOSIS — E669 Obesity, unspecified: Secondary | ICD-10-CM | POA: Diagnosis not present

## 2018-05-27 DIAGNOSIS — O10013 Pre-existing essential hypertension complicating pregnancy, third trimester: Secondary | ICD-10-CM | POA: Diagnosis not present

## 2018-05-27 DIAGNOSIS — O289 Unspecified abnormal findings on antenatal screening of mother: Secondary | ICD-10-CM

## 2018-05-27 DIAGNOSIS — Z7901 Long term (current) use of anticoagulants: Secondary | ICD-10-CM | POA: Insufficient documentation

## 2018-05-27 DIAGNOSIS — O24112 Pre-existing diabetes mellitus, type 2, in pregnancy, second trimester: Secondary | ICD-10-CM

## 2018-05-27 DIAGNOSIS — O0990 Supervision of high risk pregnancy, unspecified, unspecified trimester: Secondary | ICD-10-CM

## 2018-05-27 IMAGING — US US MFM FETAL BPP W/O NON-STRESS
1 series · 12 of 16 positions shown · non-contrast
Comparison: none

[Series 1: us mfm fetal bpp w/o non-stress · 16 acquisitions, 12 frames shown]
[im 1/16]
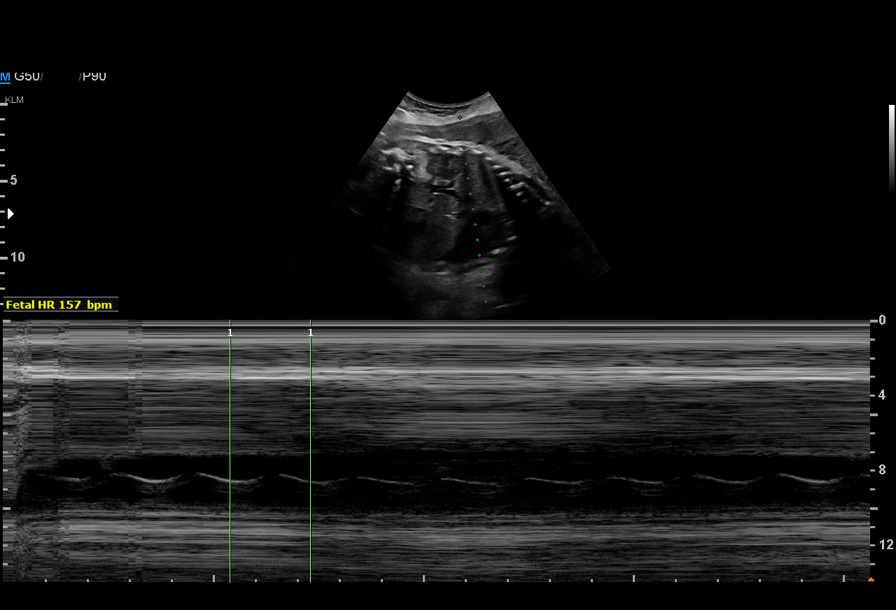
[im 3/16]
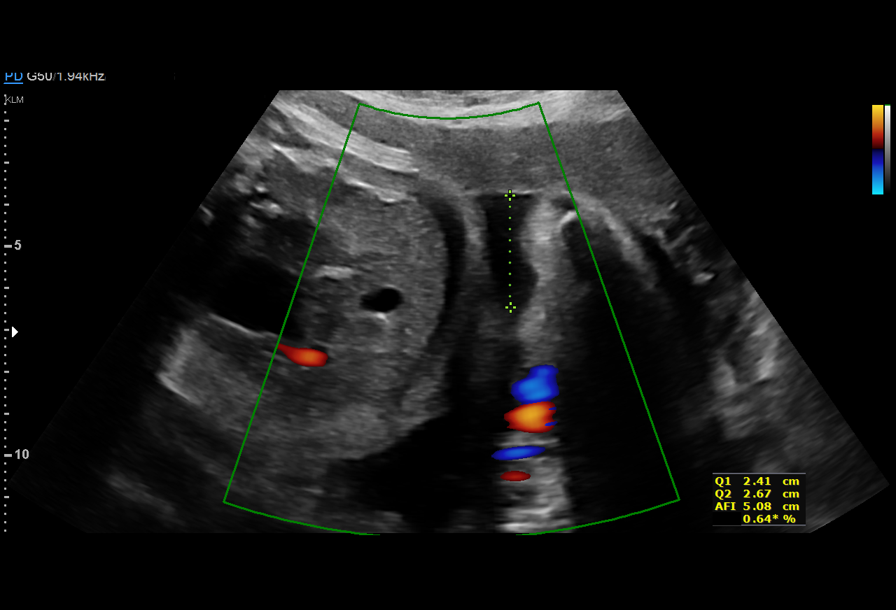
[im 4/16]
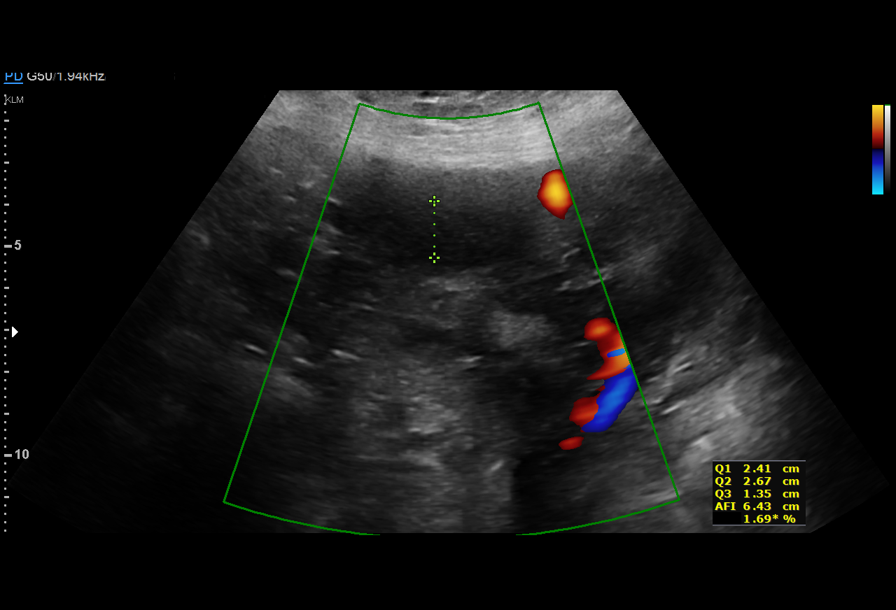
[im 5/16]
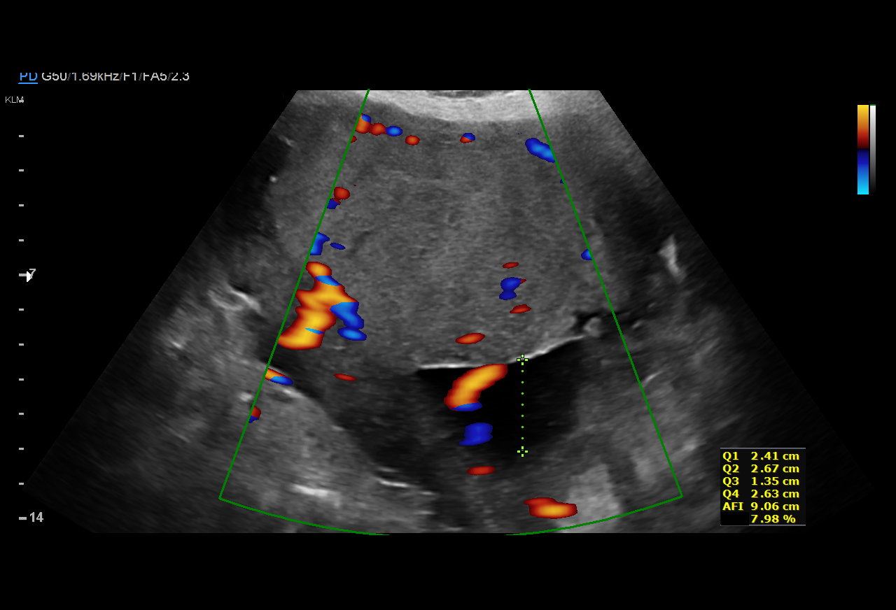
[im 7/16]
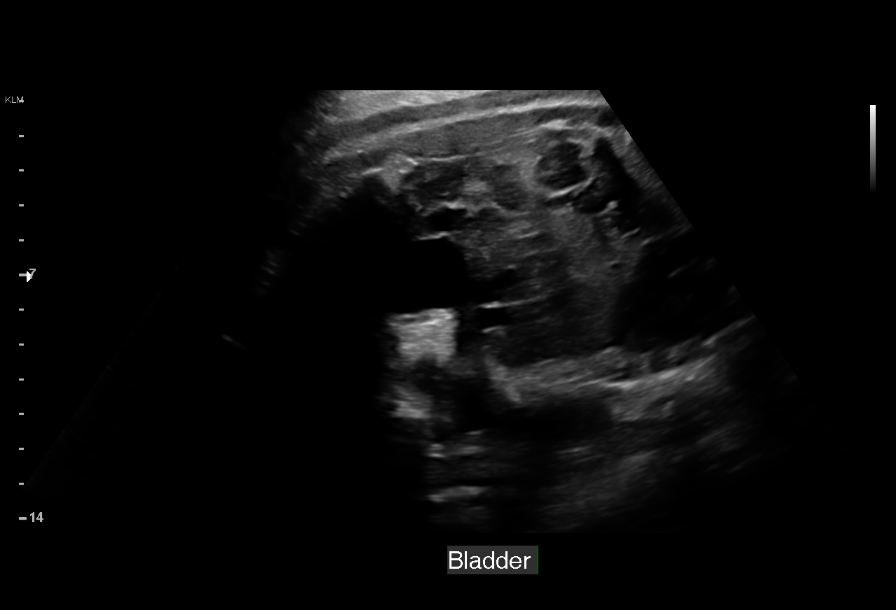
[im 8/16]
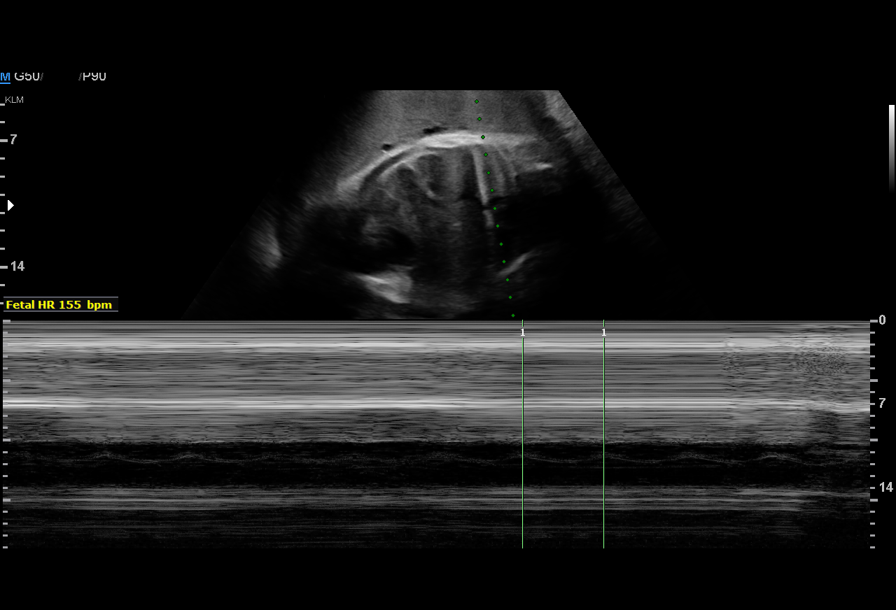
[im 9/16]
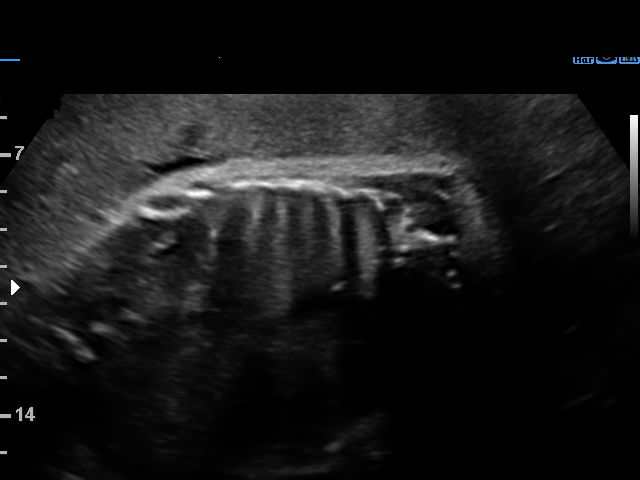
[im 11/16]
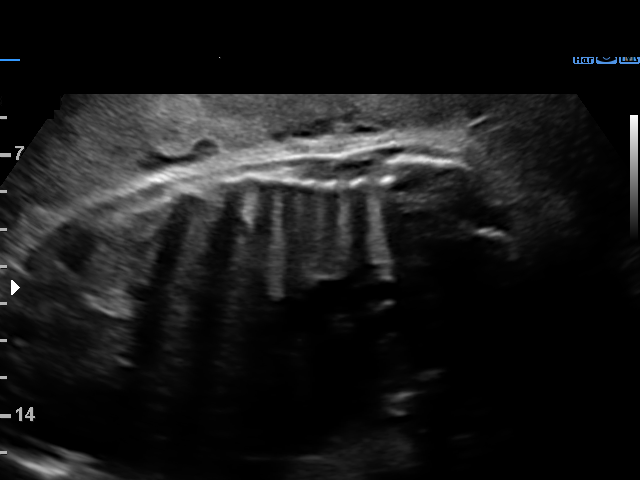
[im 12/16]
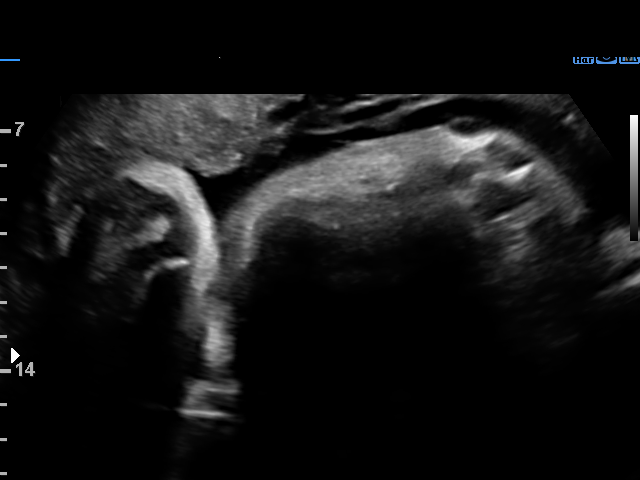
[im 13/16]
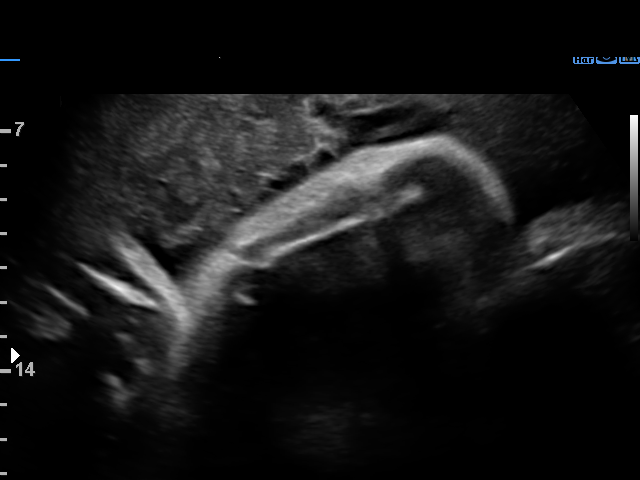
[im 15/16]
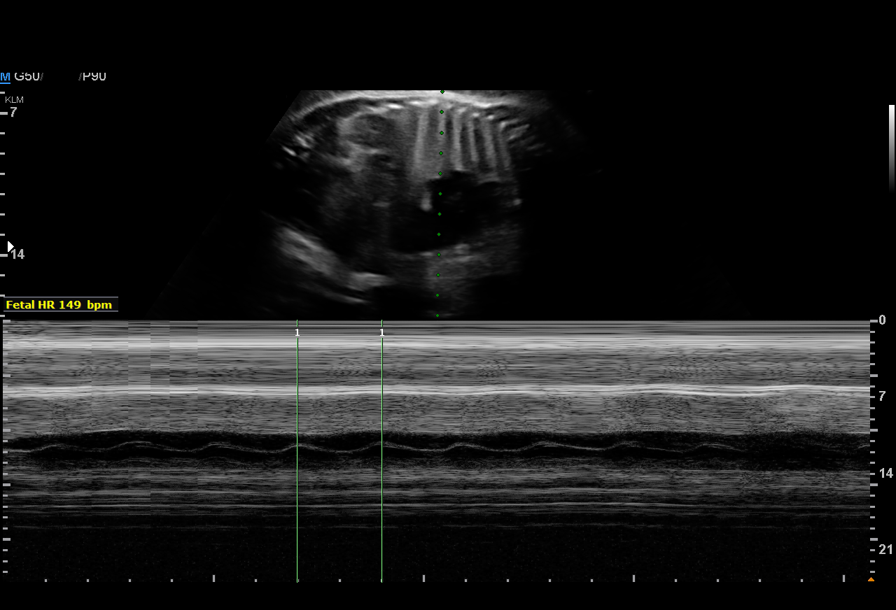
[im 16/16]
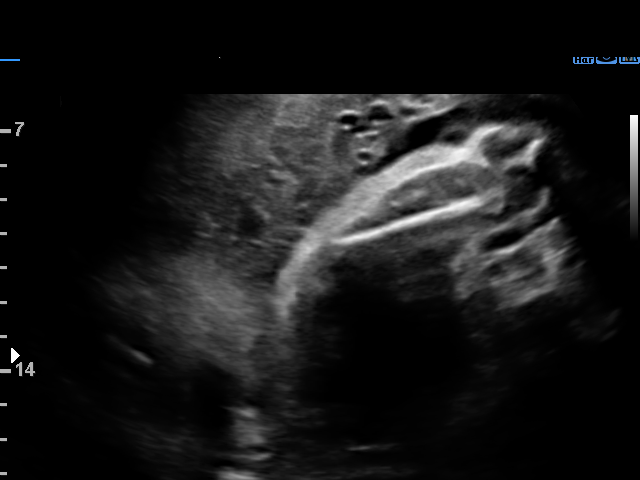

[12 of 16 positions shown; findings below may reference images not displayed]

OB/Gyn Clinic

Indications

33 weeks gestation of pregnancy
Diabetes - Pregestational,3rd trimester        [AI]
(insulin)
Advanced maternal age multigravida 35+,        [AI]
third trimester (low FF NIPS; declined
additional testing
Obesity complicating pregnancy, third          [AI]
trimester
Hypothyroid (synthroid)                        [AI] [AI]
Hypertension - Chronic/Pre-existing            [AI]
(labetalol)
Antiphospholipid syndrome complicating         O99.119, [AI]
pregnancy, antepartum (Lovenox)
OB History

Blood Type:            Height:  5'1"   Weight (lb):  212       BMI:
Gravidity:    4          SAB:   3
Living:       0
Fetal Evaluation

Num Of Fetuses:     1
Fetal Heart         157
Rate(bpm):
Cardiac Activity:   Observed
Presentation:       Cephalic
Amniotic Fluid
AFI FV:      Subjectively within normal limits

AFI Sum(cm)     %Tile       Largest Pocket(cm)
9.06            11

RUQ(cm)       RLQ(cm)       LUQ(cm)        LLQ(cm)
2.41

Comment:    [DATE] BPP in 23 minutes.
Biophysical Evaluation

Amniotic F.V:   Within normal limits       F. Tone:        Observed
F. Movement:    Observed                   Score:          [DATE]
F. Breathing:   Observed
Gestational Age

LMP:           33w 4d        Date:  [DATE]                 EDD:   [DATE]
Best:          33w 4d     Det. By:  LMP  ([DATE])          EDD:   [DATE]
Impression

Amniotic fluid is normal and good fetal activity is seen. BPP
[DATE].
Recommendations

Continue weekly antenatal testing till delivery.

## 2018-05-30 ENCOUNTER — Encounter: Payer: Self-pay | Admitting: Obstetrics & Gynecology

## 2018-05-30 ENCOUNTER — Telehealth: Payer: Self-pay | Admitting: *Deleted

## 2018-05-30 DIAGNOSIS — A599 Trichomoniasis, unspecified: Secondary | ICD-10-CM | POA: Insufficient documentation

## 2018-05-30 MED ORDER — METRONIDAZOLE 500 MG PO TABS: ORAL_TABLET | ORAL | 0 refills | Status: DC

## 2018-05-30 NOTE — Telephone Encounter (Signed)
LM on voicemail that she was still positive for Trich and she will be treated again.  RX sent to her pharmacy.  Her spouse will need treatment again if there has been any sexual contact.  TOC to be done in 2 weeks

## 2018-05-30 NOTE — Telephone Encounter (Signed)
-----   Message from Lesly DukesKelly H Leggett, MD sent at 05/30/2018  2:10 PM EDT ----- Gale Journeyrichamonas still positive.  Retreat with Flagyl 2 gm x1.  Partner to be retreated if there has been contact.  TOC in 2 weeks.  RN to call and prescribe Rx

## 2018-06-02 ENCOUNTER — Ambulatory Visit (HOSPITAL_COMMUNITY)
Admission: RE | Admit: 2018-06-02 | Discharge: 2018-06-02 | Disposition: A | Discharge status: Home / Self Care | Payer: PRIVATE HEALTH INSURANCE | Source: Ambulatory Visit | Attending: Obstetrics & Gynecology | Admitting: Obstetrics & Gynecology

## 2018-06-02 ENCOUNTER — Encounter (HOSPITAL_COMMUNITY): Payer: Self-pay

## 2018-06-02 ENCOUNTER — Other Ambulatory Visit (HOSPITAL_COMMUNITY): Payer: Self-pay | Admitting: *Deleted

## 2018-06-02 ENCOUNTER — Other Ambulatory Visit (HOSPITAL_COMMUNITY): Payer: Self-pay | Admitting: Obstetrics and Gynecology

## 2018-06-02 DIAGNOSIS — O99280 Endocrine, nutritional and metabolic diseases complicating pregnancy, unspecified trimester: Secondary | ICD-10-CM

## 2018-06-02 DIAGNOSIS — O99283 Endocrine, nutritional and metabolic diseases complicating pregnancy, third trimester: Secondary | ICD-10-CM

## 2018-06-02 DIAGNOSIS — Z3A34 34 weeks gestation of pregnancy: Secondary | ICD-10-CM | POA: Diagnosis not present

## 2018-06-02 DIAGNOSIS — O289 Unspecified abnormal findings on antenatal screening of mother: Secondary | ICD-10-CM

## 2018-06-02 DIAGNOSIS — Z362 Encounter for other antenatal screening follow-up: Secondary | ICD-10-CM

## 2018-06-02 DIAGNOSIS — O10919 Unspecified pre-existing hypertension complicating pregnancy, unspecified trimester: Secondary | ICD-10-CM

## 2018-06-02 DIAGNOSIS — O09523 Supervision of elderly multigravida, third trimester: Secondary | ICD-10-CM | POA: Diagnosis not present

## 2018-06-02 DIAGNOSIS — E039 Hypothyroidism, unspecified: Secondary | ICD-10-CM

## 2018-06-02 DIAGNOSIS — Z79899 Other long term (current) drug therapy: Secondary | ICD-10-CM | POA: Insufficient documentation

## 2018-06-02 DIAGNOSIS — O24113 Pre-existing diabetes mellitus, type 2, in pregnancy, third trimester: Secondary | ICD-10-CM | POA: Diagnosis not present

## 2018-06-02 DIAGNOSIS — Z794 Long term (current) use of insulin: Secondary | ICD-10-CM | POA: Diagnosis not present

## 2018-06-02 DIAGNOSIS — D6861 Antiphospholipid syndrome: Secondary | ICD-10-CM | POA: Insufficient documentation

## 2018-06-02 DIAGNOSIS — E119 Type 2 diabetes mellitus without complications: Secondary | ICD-10-CM | POA: Insufficient documentation

## 2018-06-02 DIAGNOSIS — O99113 Other diseases of the blood and blood-forming organs and certain disorders involving the immune mechanism complicating pregnancy, third trimester: Secondary | ICD-10-CM | POA: Diagnosis not present

## 2018-06-02 DIAGNOSIS — O24313 Unspecified pre-existing diabetes mellitus in pregnancy, third trimester: Secondary | ICD-10-CM

## 2018-06-02 DIAGNOSIS — E669 Obesity, unspecified: Secondary | ICD-10-CM | POA: Insufficient documentation

## 2018-06-02 DIAGNOSIS — O24119 Pre-existing diabetes mellitus, type 2, in pregnancy, unspecified trimester: Secondary | ICD-10-CM

## 2018-06-02 DIAGNOSIS — O99213 Obesity complicating pregnancy, third trimester: Secondary | ICD-10-CM | POA: Diagnosis not present

## 2018-06-02 DIAGNOSIS — O24112 Pre-existing diabetes mellitus, type 2, in pregnancy, second trimester: Secondary | ICD-10-CM

## 2018-06-02 DIAGNOSIS — O0990 Supervision of high risk pregnancy, unspecified, unspecified trimester: Secondary | ICD-10-CM

## 2018-06-02 DIAGNOSIS — O09529 Supervision of elderly multigravida, unspecified trimester: Secondary | ICD-10-CM

## 2018-06-02 IMAGING — US US MFM FETAL BPP W/O NON-STRESS
1 series · 14 of 28 positions shown · non-contrast
Comparison: none

[Series 1: us mfm fetal bpp w/o non-stress · 52 acquisitions, 14 frames shown]
[im 2/52]
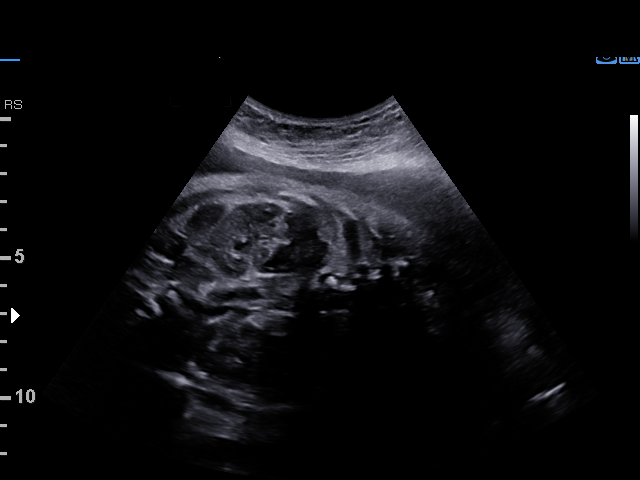
[im 6/52]
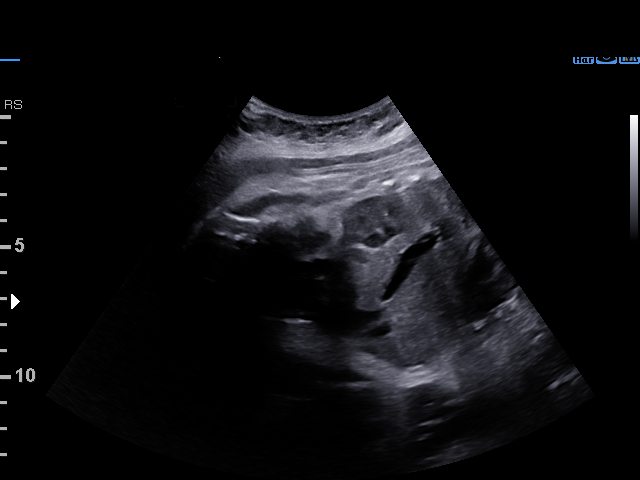
[im 10/52]
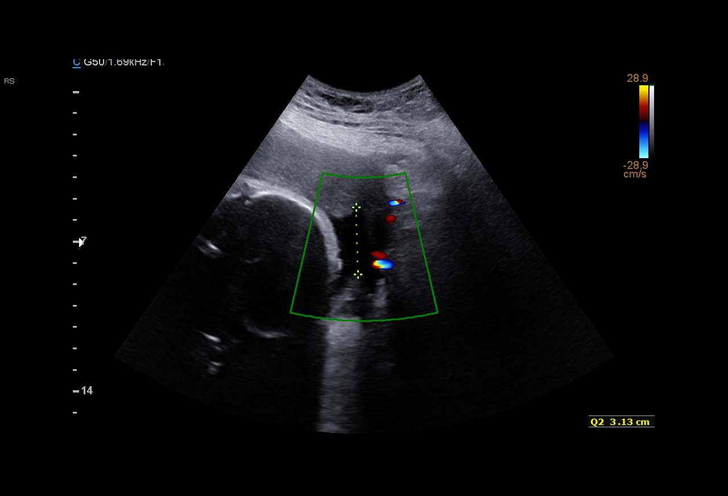
[im 14/52]
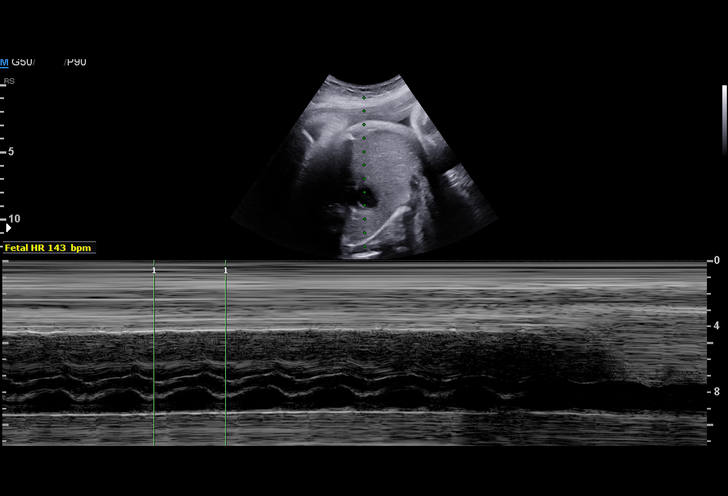
[im 18/52]
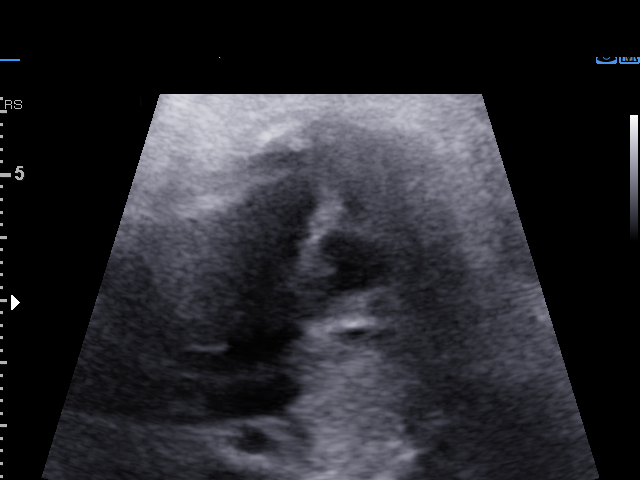
[im 21/52]
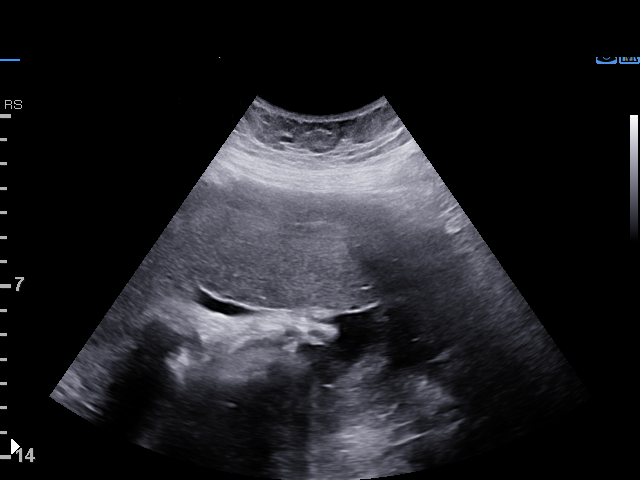
[im 25/52]
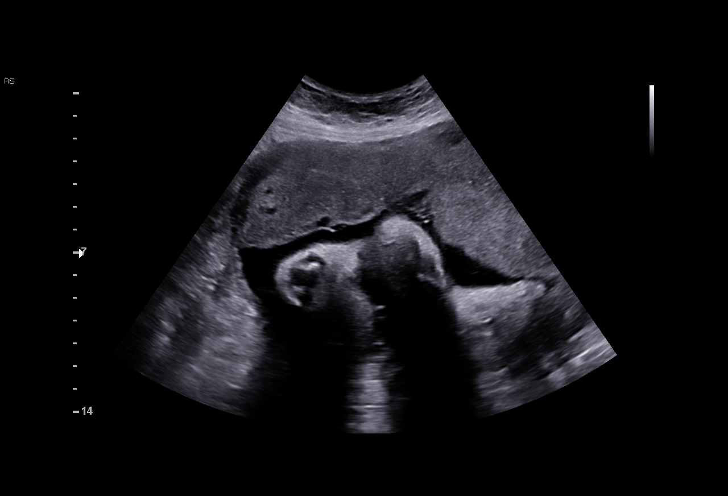
[im 29/52]
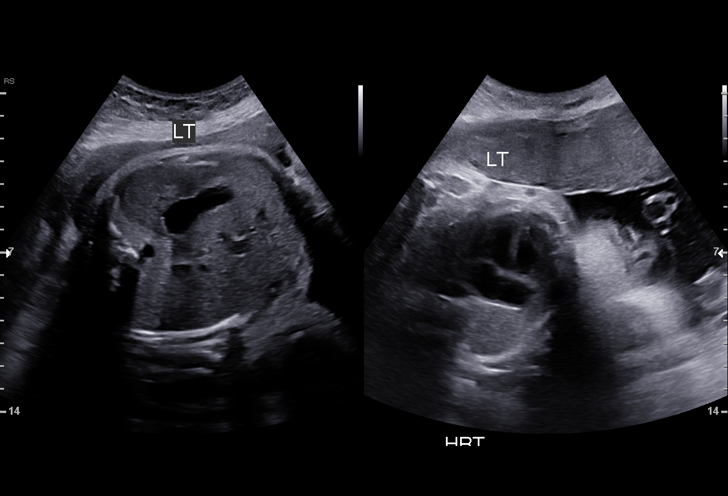
[im 33/52]
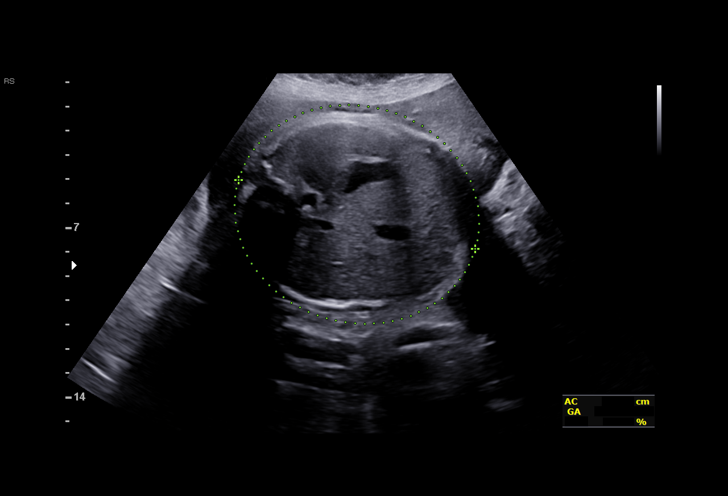
[im 36/52]
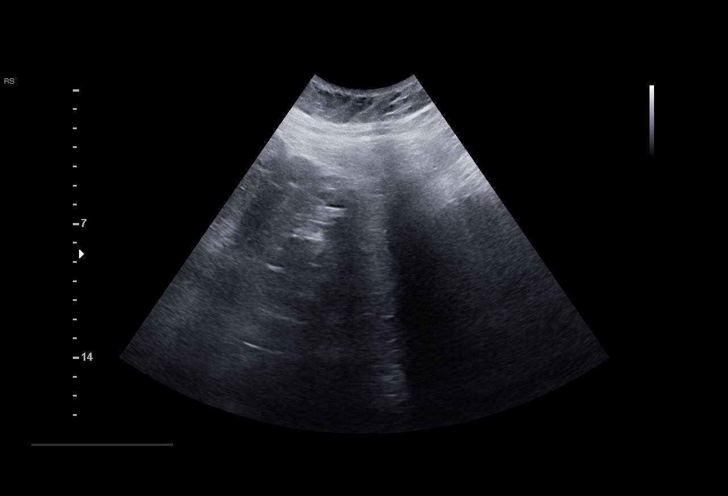
[im 40/52]
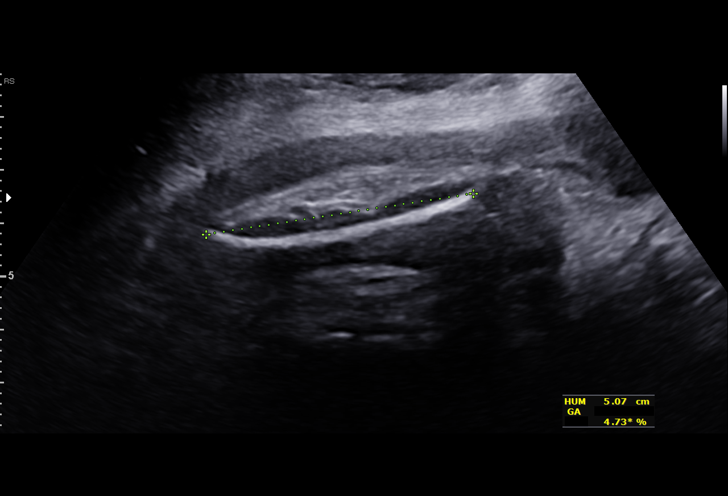
[im 44/52]
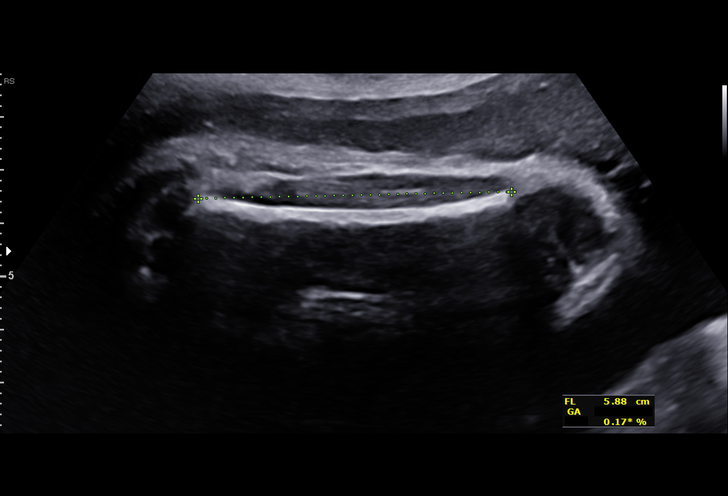
[im 48/52]
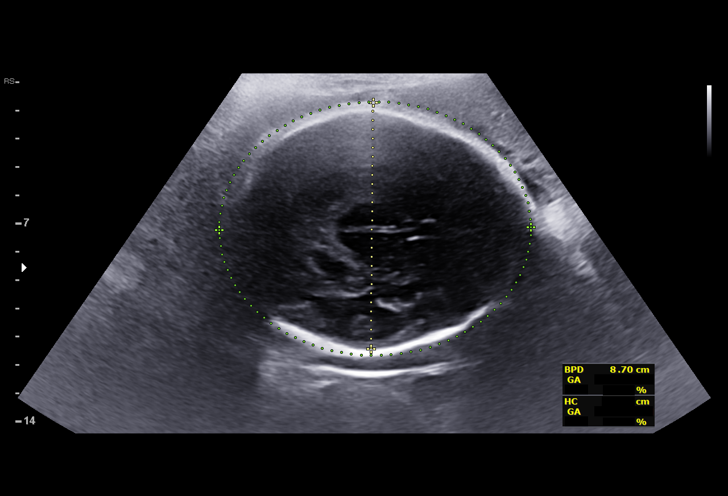
[im 52/52]
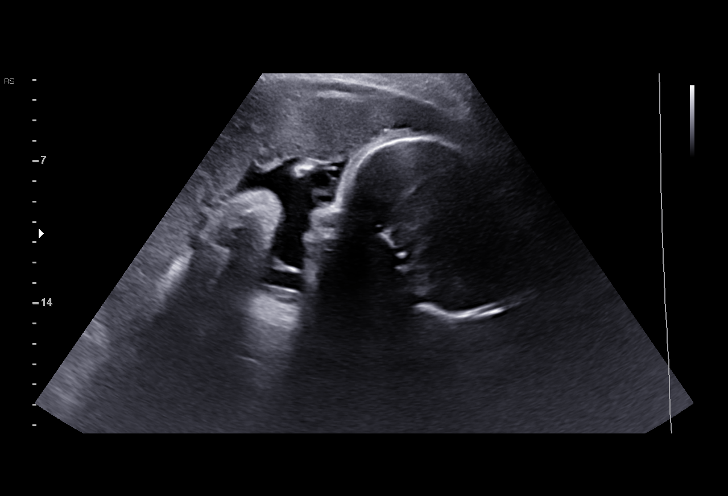

[14 of 28 positions shown; findings below may reference images not displayed]

pm)

OB/Gyn Clinic

Indications

34 weeks gestation of pregnancy
Diabetes - Pregestational,3rd trimester        [0I]
(insulin)
Advanced maternal age multigravida 35+,        [0I]
third trimester (low FF NIPS; declined
additional testing
Obesity complicating pregnancy, third          [0I]
trimester
Hypothyroid (synthroid)                        [0I] [0I]
Hypertension - Chronic/Pre-existing            [0I]
(labetalol)
Antiphospholipid syndrome complicating         O99.119, [0I]
pregnancy, antepartum (Lovenox)
Encounter for other antenatal screening        [0I]
follow-up
OB History

Blood Type:            Height:  5'1"   Weight (lb):  212       BMI:
Gravidity:    4          SAB:   3
Living:       0
Fetal Evaluation
Num Of Fetuses:     1
Fetal Heart         143
Rate(bpm):
Cardiac Activity:   Observed
Presentation:       Cephalic
Placenta:           Anterior
P. Cord Insertion:  Previously Visualized

Amniotic Fluid
AFI FV:      Subjectively within normal limits

AFI Sum(cm)     %Tile       Largest Pocket(cm)
10.77           25

RUQ(cm)       RLQ(cm)       LUQ(cm)        LLQ(cm)
2.77
Biophysical Evaluation

Amniotic F.V:   Within normal limits       F. Tone:        Observed
F. Movement:    Observed                   Score:          [DATE]
F. Breathing:   Observed
Biometry

BPD:        86  mm     G. Age:  34w 5d         57  %    CI:        76.42   %    70 - 86
FL/HC:      19.1   %    19.4 -
HC:      311.7  mm     G. Age:  34w 6d         26  %    HC/AC:      1.03        0.96 -
AC:      303.6  mm     G. Age:  34w 3d         52  %    FL/BPD:     69.1   %    71 - 87
FL:       59.4  mm     G. Age:  31w 0d        < 3  %    FL/AC:      19.6   %    20 - 24
HUM:      51.7  mm     G. Age:  30w 1d        < 5  %

Est. FW:    [0I]  gm    4 lb 14 oz      41  %
Gestational Age

LMP:           34w 3d        Date:  [DATE]                 EDD:   [DATE]
U/S Today:     33w 5d                                        EDD:   [DATE]
Best:          34w 3d     Det. By:  LMP  ([DATE])          EDD:   [DATE]
Anatomy

Cranium:               Appears normal         Aortic Arch:            Previously seen
Cavum:                 Previously seen        Ductal Arch:            Previously seen
Ventricles:            Previously seen        Diaphragm:              Previously seen
Choroid Plexus:        Previously seen        Stomach:                Appears normal, left
sided
Cerebellum:            Previously seen        Abdomen:                Appears normal
Posterior Fossa:       Previously seen        Abdominal Wall:         Previously seen
Nuchal Fold:           Not applicable (>20    Cord Vessels:           Previously seen
wks GA)
Face:                  Orbits and profile     Kidneys:                Appear normal
previously seen
Lips:                  Previously seen        Bladder:                Appears normal
Thoracic:              Appears normal         Spine:                  Previously seen
Heart:                 Appears normal         Upper Extremities:      Previously seen
(4CH, axis, and
situs)
RVOT:                  Previously seen        Lower Extremities:      Previously seen
LVOT:                  Appears normal
Other:  Fetus appears to be a female. Heels and LT 5th digit previously
visualized. Nasal bone prev visualized. Technically difficult due to
maternal habitus and fetal position.
Cervix Uterus Adnexa

Cervix
Not visualized (advanced GA >[0I])

Uterus
No abnormality visualized.

Left Ovary
Not visualized.

Right Ovary
Not visualized.

Adnexa:       No abnormality visualized. No adnexal mass
visualized.
Impression

Fetal growth is appropriate for gestational age. Amniotic fluid
is normal and good fetal activity is seen. Antenatal testing is
reassuring. BPP [DATE].
Recommendations

Continue weekly antenatal testing till delivery.

## 2018-06-06 ENCOUNTER — Ambulatory Visit (INDEPENDENT_AMBULATORY_CARE_PROVIDER_SITE_OTHER): Payer: PRIVATE HEALTH INSURANCE | Admitting: Certified Nurse Midwife

## 2018-06-06 VITALS — BP 144/88 | HR 87 | Wt 240.0 lb

## 2018-06-06 DIAGNOSIS — O99283 Endocrine, nutritional and metabolic diseases complicating pregnancy, third trimester: Secondary | ICD-10-CM

## 2018-06-06 DIAGNOSIS — O0990 Supervision of high risk pregnancy, unspecified, unspecified trimester: Secondary | ICD-10-CM

## 2018-06-06 DIAGNOSIS — E039 Hypothyroidism, unspecified: Secondary | ICD-10-CM

## 2018-06-06 DIAGNOSIS — O10919 Unspecified pre-existing hypertension complicating pregnancy, unspecified trimester: Secondary | ICD-10-CM

## 2018-06-06 DIAGNOSIS — O24113 Pre-existing diabetes mellitus, type 2, in pregnancy, third trimester: Secondary | ICD-10-CM

## 2018-06-06 DIAGNOSIS — O0993 Supervision of high risk pregnancy, unspecified, third trimester: Secondary | ICD-10-CM

## 2018-06-06 DIAGNOSIS — O24112 Pre-existing diabetes mellitus, type 2, in pregnancy, second trimester: Secondary | ICD-10-CM

## 2018-06-06 DIAGNOSIS — A599 Trichomoniasis, unspecified: Secondary | ICD-10-CM

## 2018-06-06 DIAGNOSIS — O99280 Endocrine, nutritional and metabolic diseases complicating pregnancy, unspecified trimester: Secondary | ICD-10-CM

## 2018-06-06 DIAGNOSIS — O10913 Unspecified pre-existing hypertension complicating pregnancy, third trimester: Secondary | ICD-10-CM

## 2018-06-06 DIAGNOSIS — D6861 Antiphospholipid syndrome: Secondary | ICD-10-CM

## 2018-06-06 LAB — POCT URINALYSIS DIPSTICK OB
BILIRUBIN UA: NEGATIVE
Blood, UA: NEGATIVE
GLUCOSE, UA: NEGATIVE — AB
LEUKOCYTES UA: NEGATIVE
Nitrite, UA: NEGATIVE
POC,PROTEIN,UA: NEGATIVE
SPEC GRAV UA: 1.01 (ref 1.010–1.025)
Urobilinogen, UA: 0.2 E.U./dL
pH, UA: 5 (ref 5.0–8.0)

## 2018-06-06 NOTE — Progress Notes (Signed)
Subjective:  Kelli Miller is a 37 y.o. G4P0030 at 6262w0d being seen today for ongoing prenatal care.  She is currently monitored for the following issues for this high-risk pregnancy and has Supervision of high risk pregnancy, antepartum; Pre-existing type 2 diabetes mellitus in pregnancy in second trimester; Hypothyroidism affecting pregnancy, antepartum; Advanced maternal age in multigravida; Obesity in pregnancy; Chronic hypertension in pregnancy; Abnormal antenatal test; PCOS (polycystic ovarian syndrome); GERD (gastroesophageal reflux disease); Psoriasis; Recurrent pregnancy loss; Antiphospholipid antibody syndrome (HCC); Elevated TSH; Hypomenorrhea/oligomenorrhea; and Trichomoniasis on their problem list.  Patient reports no complaints.  Contractions: Irritability. Vag. Bleeding: None.  Movement: Present. Denies leaking of fluid.   Did not take Labetalol today, worked last night. Denies HA, visual disturbances, RUQ pain, SOB, and CP.  The following portions of the patient's history were reviewed and updated as appropriate: allergies, current medications, past family history, past medical history, past social history, past surgical history and problem list. Problem list updated.  Objective:   Vitals:   06/06/18 0816 06/06/18 0850  BP: (!) 144/96 (!) 144/88  Pulse: 87   Weight: 108.9 kg     Fetal Status: Fetal Heart Rate (bpm): 146 Fundal Height: 36 cm Movement: Present     General:  Alert, oriented and cooperative. Patient is in no acute distress.  Skin: Skin is warm and dry. No rash noted.   Cardiovascular: Normal heart rate noted  Respiratory: Normal respiratory effort, no problems with respiration noted  Abdomen: Soft, gravid, appropriate for gestational age. Pain/Pressure: Absent     Pelvic: Vag. Bleeding: None Vag D/C Character: Thin   Cervical exam deferred        Extremities: Normal range of motion.  Edema: Trace  Mental Status: Normal mood and affect. Normal behavior. Normal  judgment and thought content.   Urinalysis:      Assessment and Plan:  Pregnancy: G4P0030 at 1462w0d  1. Supervision of high risk pregnancy, antepartum - POC Urinalysis Dipstick OB  2. Pre-existing type 2 diabetes mellitus in pregnancy in second trimester - FBS running 80-120s - greater than 50% are above 90 - postprandials running 120-180 - greater than 50 % above 120 (am values better controlled than pm) - recommend increase NPH by 2 units and Humalog pm dose by 2 units - US last week- 4'14 41%ile, AFI 10 - continue weekly BPP in MFM  3. Hypothyroidism affecting pregnancy, antepartum - stable  4. Chronic hypertension in pregnancy - BP slightly elevated today, did not take am meds yet - asymptomatic - watch closely - PEC precautions - continue ante testing  5. Trichomoniasis - she and partner treated - TOC next week  6. Antiphospholipid antibody syndrome (HCC) - continue Lovenox - consult with Dr. Earlene Plateravis, does not recommend switch to Heparin  Preterm labor symptoms and general obstetric precautions including but not limited to vaginal bleeding, contractions, leaking of fluid and fetal movement were reviewed in detail with the patient. Please refer to After Visit Summary for other counseling recommendations.  Return in about 1 week (around 06/13/2018).   Donette LarryBhambri, Jazel Nimmons, CNM

## 2018-06-06 NOTE — Progress Notes (Signed)
PT states she has not taken her labetalol today. No visual changes or headaches. BP retake 144/88.

## 2018-06-07 ENCOUNTER — Encounter (HOSPITAL_COMMUNITY): Payer: Self-pay

## 2018-06-07 ENCOUNTER — Ambulatory Visit (HOSPITAL_COMMUNITY)
Admission: RE | Admit: 2018-06-07 | Discharge: 2018-06-07 | Disposition: A | Discharge status: Home / Self Care | Payer: PRIVATE HEALTH INSURANCE | Source: Ambulatory Visit | Attending: Obstetrics & Gynecology | Admitting: Obstetrics & Gynecology

## 2018-06-07 ENCOUNTER — Telehealth: Payer: Self-pay | Admitting: *Deleted

## 2018-06-07 ENCOUNTER — Other Ambulatory Visit (HOSPITAL_COMMUNITY): Payer: Self-pay | Admitting: *Deleted

## 2018-06-07 ENCOUNTER — Other Ambulatory Visit (HOSPITAL_COMMUNITY): Payer: Self-pay | Admitting: Obstetrics and Gynecology

## 2018-06-07 DIAGNOSIS — E119 Type 2 diabetes mellitus without complications: Secondary | ICD-10-CM | POA: Diagnosis not present

## 2018-06-07 DIAGNOSIS — O24119 Pre-existing diabetes mellitus, type 2, in pregnancy, unspecified trimester: Secondary | ICD-10-CM

## 2018-06-07 DIAGNOSIS — O24113 Pre-existing diabetes mellitus, type 2, in pregnancy, third trimester: Secondary | ICD-10-CM | POA: Diagnosis not present

## 2018-06-07 DIAGNOSIS — O99213 Obesity complicating pregnancy, third trimester: Secondary | ICD-10-CM

## 2018-06-07 DIAGNOSIS — O99280 Endocrine, nutritional and metabolic diseases complicating pregnancy, unspecified trimester: Secondary | ICD-10-CM

## 2018-06-07 DIAGNOSIS — E039 Hypothyroidism, unspecified: Secondary | ICD-10-CM

## 2018-06-07 DIAGNOSIS — Z3A35 35 weeks gestation of pregnancy: Secondary | ICD-10-CM | POA: Diagnosis not present

## 2018-06-07 DIAGNOSIS — O289 Unspecified abnormal findings on antenatal screening of mother: Secondary | ICD-10-CM

## 2018-06-07 DIAGNOSIS — O09523 Supervision of elderly multigravida, third trimester: Secondary | ICD-10-CM | POA: Diagnosis not present

## 2018-06-07 DIAGNOSIS — Z794 Long term (current) use of insulin: Secondary | ICD-10-CM | POA: Insufficient documentation

## 2018-06-07 DIAGNOSIS — O24112 Pre-existing diabetes mellitus, type 2, in pregnancy, second trimester: Secondary | ICD-10-CM

## 2018-06-07 DIAGNOSIS — Z362 Encounter for other antenatal screening follow-up: Secondary | ICD-10-CM

## 2018-06-07 DIAGNOSIS — O10013 Pre-existing essential hypertension complicating pregnancy, third trimester: Secondary | ICD-10-CM | POA: Diagnosis not present

## 2018-06-07 DIAGNOSIS — O10919 Unspecified pre-existing hypertension complicating pregnancy, unspecified trimester: Secondary | ICD-10-CM

## 2018-06-07 DIAGNOSIS — O0990 Supervision of high risk pregnancy, unspecified, unspecified trimester: Secondary | ICD-10-CM

## 2018-06-07 IMAGING — US US MFM FETAL BPP W/O NON-STRESS
1 series · 15 of 27 positions shown · non-contrast
Comparison: none

[Series 1: us mfm fetal bpp w/o non-stress · 27 acquisitions, 15 frames shown]
[im 1/27]
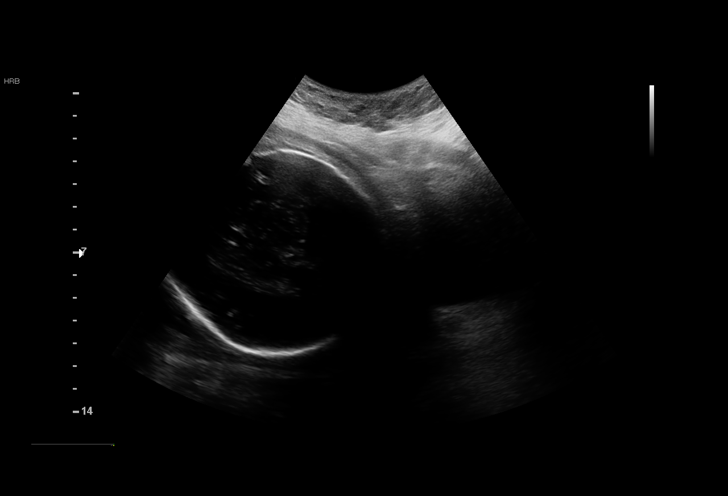
[im 3/27]
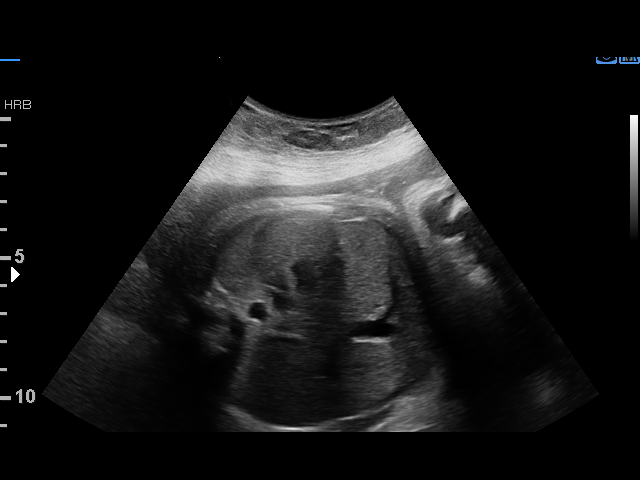
[im 5/27]
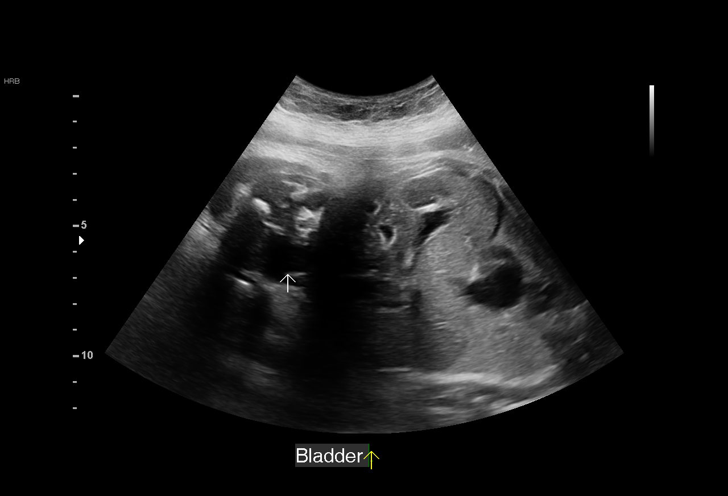
[im 7/27]
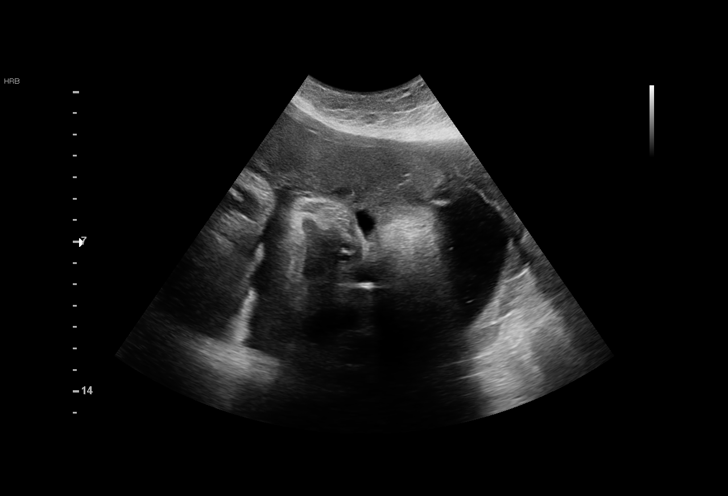
[im 9/27]
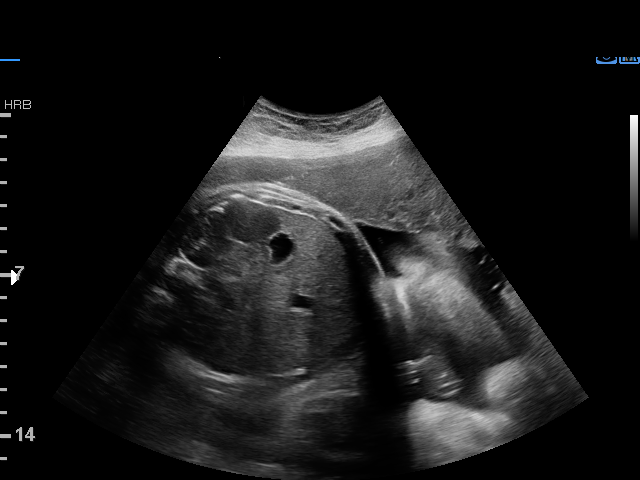
[im 10/27]
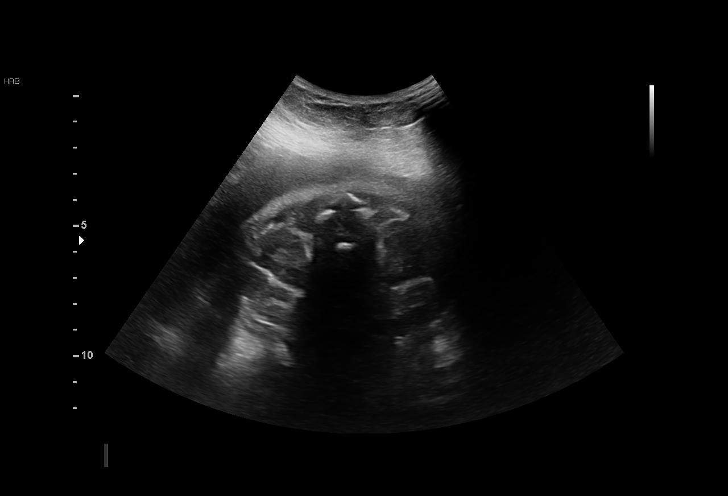
[im 12/27]
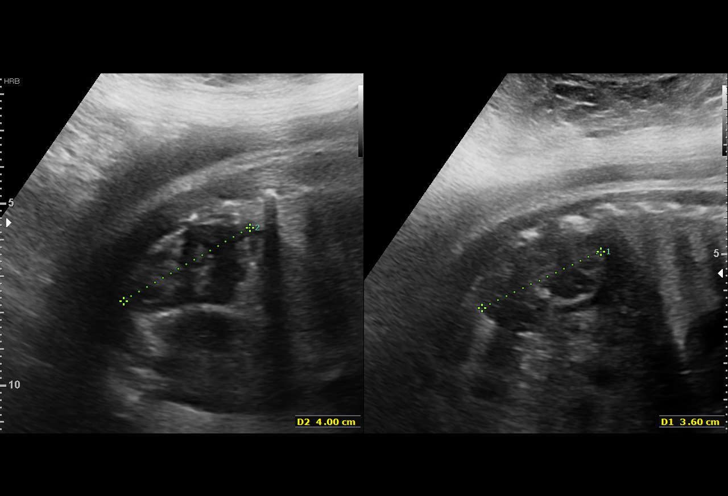
[im 14/27]
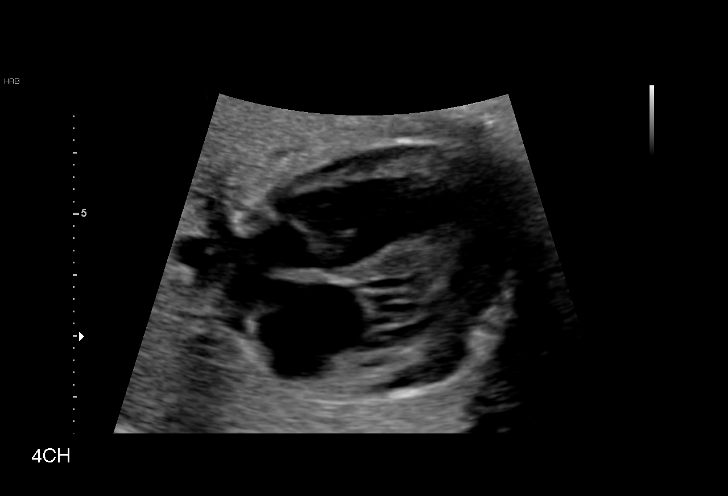
[im 16/27]
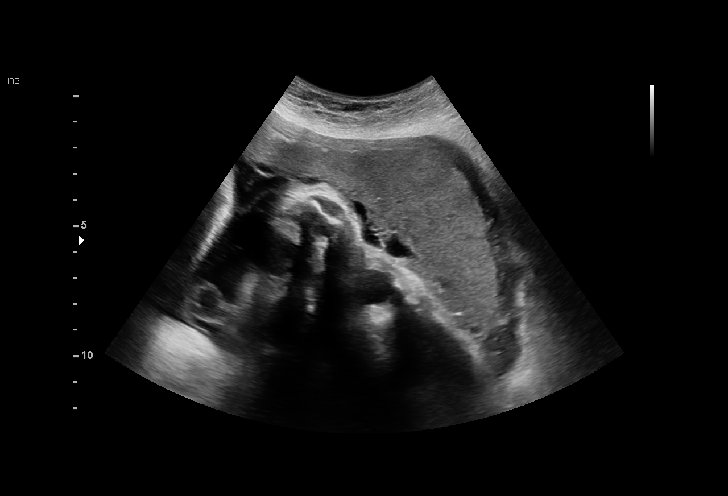
[im 18/27]
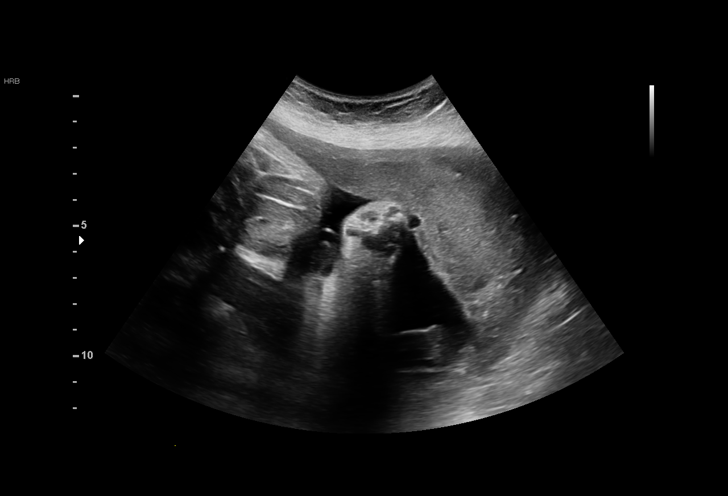
[im 19/27]
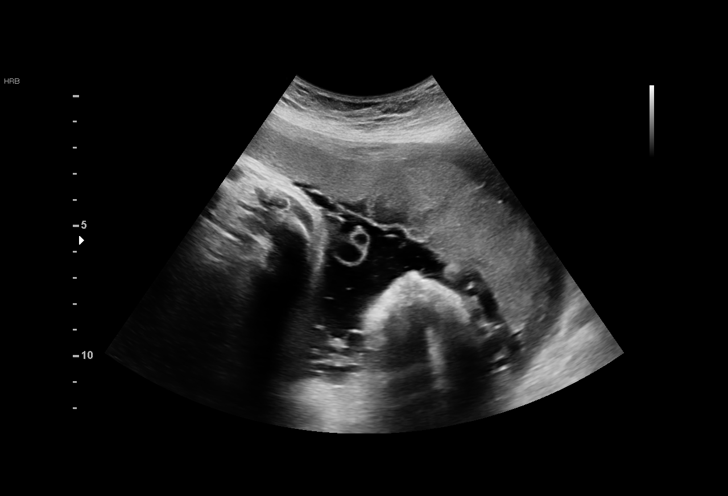
[im 21/27]
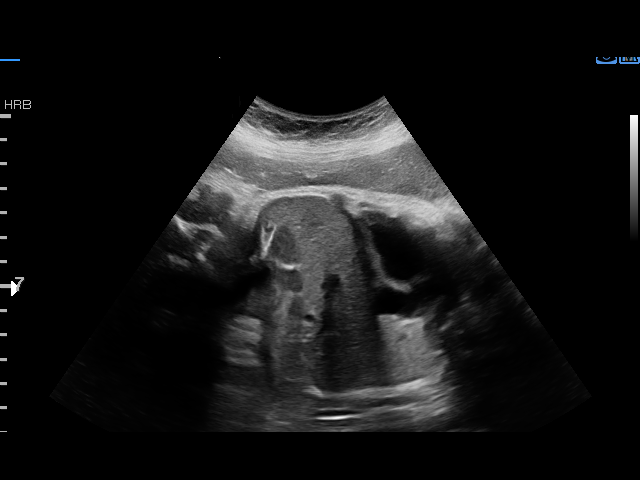
[im 23/27]
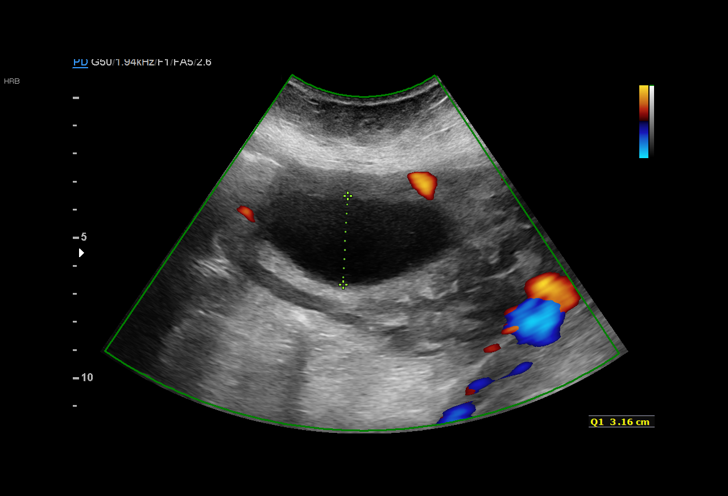
[im 25/27]
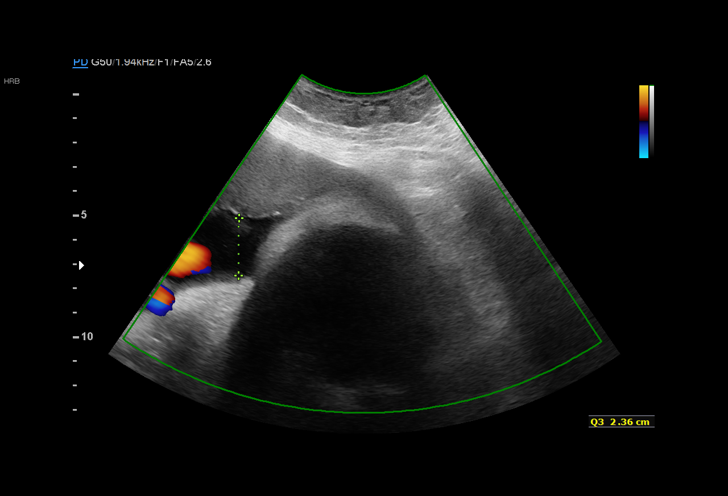
[im 27/27]
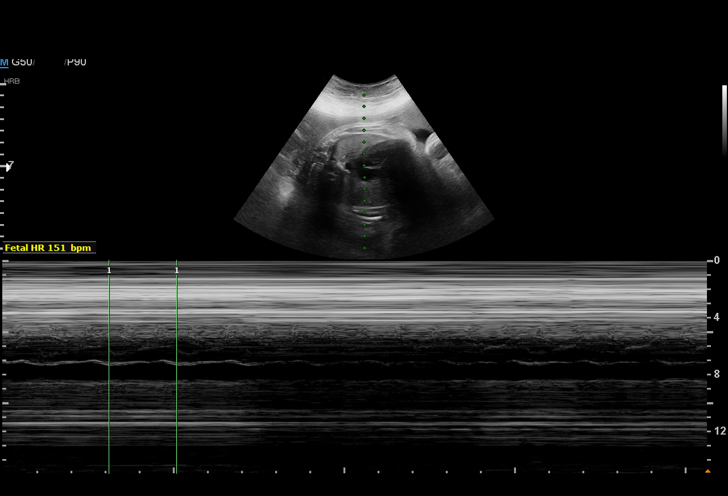

[15 of 27 positions shown; findings below may reference images not displayed]

OB/Gyn Clinic

Indications

Diabetes - Pregestational,3rd trimester        [6U]
(insulin)
Advanced maternal age multigravida 35+,        [6U]
third trimester (low FF NIPS; declined
additional testing
Obesity complicating pregnancy, third          [6U]
trimester
Hypothyroid (synthroid)                        [6U] [6U]
Hypertension - Chronic/Pre-existing            [6U]
(labetalol)
Antiphospholipid syndrome complicating         O99.119, [6U]
pregnancy, antepartum (Lovenox)
Encounter for other antenatal screening        [6U]
follow-up
35 weeks gestation of pregnancy
OB History

Blood Type:            Height:  5'1"   Weight (lb):  212       BMI:
Gravidity:    4          SAB:   3
Living:       0
Fetal Evaluation

Num Of Fetuses:     1
Fetal Heart         151
Rate(bpm):
Cardiac Activity:   Observed
Presentation:       Cephalic
Placenta:           Anterior
P. Cord Insertion:  Previously Visualized

Amniotic Fluid
AFI FV:      Within normal limits

AFI Sum(cm)     %Tile       Largest Pocket(cm)
9.13            14

RUQ(cm)       RLQ(cm)       LUQ(cm)        LLQ(cm)
3.16          0
Biophysical Evaluation

Amniotic F.V:   Within normal limits       F. Tone:        Observed
F. Movement:    Observed                   Score:          [DATE]
F. Breathing:   Observed
Gestational Age

LMP:           35w 1d        Date:  [DATE]                 EDD:   [DATE]
Best:          35w 1d     Det. By:  LMP  ([DATE])          EDD:   [DATE]
Cervix Uterus Adnexa

Cervix
Not visualized (advanced GA >[6U])
Impression

Pregestational diabetes: Patient is taking insulin. Insulin
adjustments are being made to achieve good control.
Chronic hypertension: Patient takes labetalol. Baseline
proteinuria ([DATE]) was normal.
Hypothyroidism: On levothyroxine supplements.

Patient had headache before ultrasound that resolved now.
She took Tylenol. No visual disturbances or right upper
quadrant pain.

BP at our office: 140/65 mm Hg (right) and 137/70 mm Hg
(left).

On ultrasound, amniotic fluid is normal and good fetal activity
is seen. Antenatal testing is reassuring. BPP [DATE].
Recommendations

Continue weekly antenatal testing till delivery.

## 2018-06-07 NOTE — Telephone Encounter (Signed)
LM on voicemail that Kelli Miller, CNM spoke with the addending yesterday about switching pt from Lovenox to Heparin.  Leroy LibmanKelly Davis, MD recommended that pt stay on Lovenox.  Pt will be an induction at 39 weeks and we can have control of her stopping the Lovenox before delivery.  Encouraged pt to call if she has any further questions.

## 2018-06-13 ENCOUNTER — Encounter (HOSPITAL_COMMUNITY): Payer: Self-pay

## 2018-06-13 ENCOUNTER — Encounter (HOSPITAL_COMMUNITY): Payer: Self-pay | Admitting: Obstetrics and Gynecology

## 2018-06-13 ENCOUNTER — Inpatient Hospital Stay (HOSPITAL_COMMUNITY)
Admission: AD | Admit: 2018-06-13 | Discharge: 2018-06-13 | Disposition: A | Discharge status: Home / Self Care | Payer: PRIVATE HEALTH INSURANCE | Attending: Obstetrics & Gynecology | Admitting: Obstetrics & Gynecology

## 2018-06-13 ENCOUNTER — Ambulatory Visit (INDEPENDENT_AMBULATORY_CARE_PROVIDER_SITE_OTHER): Payer: PRIVATE HEALTH INSURANCE | Admitting: Obstetrics & Gynecology

## 2018-06-13 VITALS — BP 151/77 | HR 90 | Wt 236.0 lb

## 2018-06-13 DIAGNOSIS — Z87891 Personal history of nicotine dependence: Secondary | ICD-10-CM | POA: Diagnosis not present

## 2018-06-13 DIAGNOSIS — O10013 Pre-existing essential hypertension complicating pregnancy, third trimester: Secondary | ICD-10-CM | POA: Diagnosis not present

## 2018-06-13 DIAGNOSIS — E119 Type 2 diabetes mellitus without complications: Secondary | ICD-10-CM | POA: Diagnosis not present

## 2018-06-13 DIAGNOSIS — O0990 Supervision of high risk pregnancy, unspecified, unspecified trimester: Secondary | ICD-10-CM

## 2018-06-13 DIAGNOSIS — E039 Hypothyroidism, unspecified: Secondary | ICD-10-CM

## 2018-06-13 DIAGNOSIS — O10913 Unspecified pre-existing hypertension complicating pregnancy, third trimester: Secondary | ICD-10-CM | POA: Diagnosis not present

## 2018-06-13 DIAGNOSIS — O24113 Pre-existing diabetes mellitus, type 2, in pregnancy, third trimester: Secondary | ICD-10-CM | POA: Diagnosis not present

## 2018-06-13 DIAGNOSIS — R030 Elevated blood-pressure reading, without diagnosis of hypertension: Secondary | ICD-10-CM | POA: Diagnosis present

## 2018-06-13 DIAGNOSIS — Z3A36 36 weeks gestation of pregnancy: Secondary | ICD-10-CM | POA: Diagnosis not present

## 2018-06-13 DIAGNOSIS — Z794 Long term (current) use of insulin: Secondary | ICD-10-CM | POA: Insufficient documentation

## 2018-06-13 DIAGNOSIS — Z7982 Long term (current) use of aspirin: Secondary | ICD-10-CM | POA: Insufficient documentation

## 2018-06-13 DIAGNOSIS — O99210 Obesity complicating pregnancy, unspecified trimester: Secondary | ICD-10-CM

## 2018-06-13 DIAGNOSIS — Z113 Encounter for screening for infections with a predominantly sexual mode of transmission: Secondary | ICD-10-CM

## 2018-06-13 DIAGNOSIS — D6861 Antiphospholipid syndrome: Secondary | ICD-10-CM

## 2018-06-13 DIAGNOSIS — O10919 Unspecified pre-existing hypertension complicating pregnancy, unspecified trimester: Secondary | ICD-10-CM

## 2018-06-13 DIAGNOSIS — O99280 Endocrine, nutritional and metabolic diseases complicating pregnancy, unspecified trimester: Secondary | ICD-10-CM

## 2018-06-13 DIAGNOSIS — O24112 Pre-existing diabetes mellitus, type 2, in pregnancy, second trimester: Secondary | ICD-10-CM

## 2018-06-13 LAB — URINALYSIS, ROUTINE W REFLEX MICROSCOPIC
Bilirubin Urine: NEGATIVE
GLUCOSE, UA: NEGATIVE mg/dL
Hgb urine dipstick: NEGATIVE
KETONES UR: NEGATIVE mg/dL
LEUKOCYTES UA: NEGATIVE
NITRITE: NEGATIVE
PH: 6 (ref 5.0–8.0)
Protein, ur: NEGATIVE mg/dL
Specific Gravity, Urine: 1.006 (ref 1.005–1.030)

## 2018-06-13 LAB — CBC
HCT: 35.7 % — ABNORMAL LOW (ref 36.0–46.0)
Hemoglobin: 12.4 g/dL (ref 12.0–15.0)
MCH: 32.3 pg (ref 26.0–34.0)
MCHC: 34.7 g/dL (ref 30.0–36.0)
MCV: 93 fL (ref 78.0–100.0)
Platelets: 215 10*3/uL (ref 150–400)
RBC: 3.84 MIL/uL — ABNORMAL LOW (ref 3.87–5.11)
RDW: 14 % (ref 11.5–15.5)
WBC: 11 10*3/uL — ABNORMAL HIGH (ref 4.0–10.5)

## 2018-06-13 LAB — POCT URINALYSIS DIPSTICK OB
GLUCOSE, UA: NEGATIVE — AB
POC,PROTEIN,UA: NEGATIVE

## 2018-06-13 LAB — OB RESULTS CONSOLE GBS: GBS: POSITIVE

## 2018-06-13 LAB — COMPREHENSIVE METABOLIC PANEL
ALT: 16 U/L (ref 0–44)
AST: 16 U/L (ref 15–41)
Albumin: 2.8 g/dL — ABNORMAL LOW (ref 3.5–5.0)
Alkaline Phosphatase: 86 U/L (ref 38–126)
Anion gap: 10 (ref 5–15)
BUN: 8 mg/dL (ref 6–20)
CO2: 18 mmol/L — ABNORMAL LOW (ref 22–32)
Calcium: 9 mg/dL (ref 8.9–10.3)
Chloride: 107 mmol/L (ref 98–111)
Creatinine, Ser: 0.6 mg/dL (ref 0.44–1.00)
GFR calc Af Amer: >60 mL/min (ref >60)
GFR calc non Af Amer: >60 mL/min (ref >60)
Glucose, Bld: 145 mg/dL — ABNORMAL HIGH (ref 70–99)
Potassium: 3.8 mmol/L (ref 3.5–5.1)
Sodium: 135 mmol/L (ref 135–145)
Total Bilirubin: 0.6 mg/dL (ref 0.3–1.2)
Total Protein: 5.9 g/dL — ABNORMAL LOW (ref 6.5–8.1)

## 2018-06-13 LAB — PROTEIN / CREATININE RATIO, URINE
Creatinine, Urine: 35 mg/dL
Total Protein, Urine: <6 mg/dL

## 2018-06-13 LAB — OB RESULTS CONSOLE GC/CHLAMYDIA: Gonorrhea: NEGATIVE

## 2018-06-13 MED ORDER — ACETAMINOPHEN 500 MG PO TABS
1000.0000 mg | ORAL_TABLET | Freq: Once | ORAL | Status: AC
  Administered 2018-06-13: 1000 mg via ORAL
  Filled 2018-06-13: qty 2

## 2018-06-13 NOTE — Progress Notes (Signed)
   PRENATAL VISIT NOTE  Subjective:  Kelli Miller is a 37 y.o. G4P0030 at 2570w0d being seen today for ongoing prenatal care.  She is currently monitored for the following issues for this high-risk pregnancy and has Supervision of high risk pregnancy, antepartum; Pre-existing type 2 diabetes mellitus in pregnancy in second trimester; Hypothyroidism affecting pregnancy, antepartum; Advanced maternal age in multigravida; Obesity in pregnancy; Chronic hypertension in pregnancy; Abnormal antenatal test; PCOS (polycystic ovarian syndrome); GERD (gastroesophageal reflux disease); Psoriasis; Recurrent pregnancy loss; Antiphospholipid antibody syndrome (HCC); Elevated TSH; Hypomenorrhea/oligomenorrhea; and Trichomoniasis on their problem list.  Patient reports no complaints.  Contractions: Irritability. Vag. Bleeding: None.  Movement: Present. Denies leaking of fluid.   The following portions of the patient's history were reviewed and updated as appropriate: allergies, current medications, past family history, past medical history, past social history, past surgical history and problem list. Problem list updated.  Objective:   Vitals:   06/13/18 1541  BP: (!) 151/77  Pulse: 90  Weight: 236 lb (107 kg)    Fetal Status:     Movement: Present  Presentation: Vertex  General:  Alert, oriented and cooperative. Patient is in no acute distress.  Skin: Skin is warm and dry. No rash noted.   Cardiovascular: Normal heart rate noted  Respiratory: Normal respiratory effort, no problems with respiration noted  Abdomen: Soft, gravid, appropriate for gestational age.  Pain/Pressure: Absent     Pelvic: Cervical exam performed Dilation: Closed Effacement (%): 60 Station: Ballotable  Extremities: Normal range of motion.  Edema: Trace  Mental Status: Normal mood and affect. Normal behavior. Normal judgment and thought content.   Assessment and Plan:  Pregnancy: G4P0030 at 2270w0d  1. Supervision of high risk  pregnancy, antepartum  - Culture, beta strep (group b only) - Urine cytology ancillary only - POC Urinalysis Dipstick OB  2. Antiphospholipid antibody syndrome (HCC) -on lovenox  3. Pre-existing type 2 diabetes mellitus in pregnancy in second trimester - excellent glucose control on insulin  4. Obesity in pregnancy - excellent weight gain  5. Hypothyroidism affecting pregnancy, antepartum - stable TSH  6. Chronic hypertension in pregnancy -IOL at 37 weeks  7. Elevated BPs today- she will go to MAU for further evaluation  Preterm labor symptoms and general obstetric precautions including but not limited to vaginal bleeding, contractions, leaking of fluid and fetal movement were reviewed in detail with the patient. Please refer to After Visit Summary for other counseling recommendations.  Return in about 5 weeks (around 07/18/2018) for postpartum exam.  Future Appointments  Date Time Provider Department Center  06/15/2018  2:45 PM [facility] US 5 [facility]  06/21/2018  9:45 AM [facility] US 2 [facility]  06/21/2018  1:15 PM Allie Bossierove, Agam Davenport C, MD [facility]  06/29/2018  2:00 PM [facility] US 3 [facility]-US    Allie BossierMyra C Katalina Magri, MD

## 2018-06-13 NOTE — Progress Notes (Signed)
Pt states BP has been elevated- occasional headaches but, no visual changes. First BP reading 151/77. Repeat BP 152/90.

## 2018-06-13 NOTE — MAU Note (Signed)
Pt states she was sent from office for elevated BPs. State she was told to come in for an induction tonight. States BP was 152/90 in the office today. Pt denies HA, vision changes, or RUQ pain at this time. Pt denies vaginal bleeding or LOF. Reports good fetal movement.

## 2018-06-13 NOTE — MAU Provider Note (Signed)
Chief Complaint:  Hypertension   First Provider Initiated Contact with Patient 06/13/18 2116      HPI: Kelli Miller is a 37 y.o. G4P0030 at 12w0dwho presents to maternity admissions being sent over from the office for elevated blood pressure. In the office this afternoon her BP was 151/77. She has a hx of chronic hypertension currently on Labetalol 200mg  BID. She reports a HA that started one hour prior to arrival to MAU, specifically frontal headache that started behind her eyes. She rates pain 4/10- has not taken any medication for HA. She denies RUQ pain and vision changes. She denies increased swelling. She reports good fetal movement, denies LOF, vaginal bleeding.   Past Medical History: Past Medical History:  Diagnosis Date  . Chronic hypertension in pregnancy 12/29/2017  . Diabetes (HCC)    Type 2  . Hypothyroidism     Past obstetric history: OB History  Gravida Para Term Preterm AB Living  4 0     3 0  SAB TAB Ectopic Multiple Live Births  3            # Outcome Date GA Lbr Len/2nd Weight Sex Delivery Anes PTL Lv  4 Current           3 SAB           2 SAB           1 SAB             Past Surgical History: Past Surgical History:  Procedure Laterality Date  . DILATION AND EVACUATION N/A 04/05/2014   Procedure: DILATATION AND EVACUATION with genetic studies;  Surgeon: Mitchel Honour, DO;  Location: [facility];  Service: Gynecology;  Laterality: N/A;    Family History: Family History  Problem Relation Age of Onset  . Lung cancer Father   . Lung cancer Mother     Social History: Social History   Tobacco Use  . Smoking status: Former Smoker    Types: Cigarettes    Last attempt to quit: 10/03/2013    Years since quitting: 4.6  . Smokeless tobacco: Never Used  Substance Use Topics  . Alcohol use: No  . Drug use: No    Allergies:  Allergies  Allergen Reactions  . Bactrim [Sulfamethoxazole-Trimethoprim] Rash    Meds:  Medications Prior to Admission  Medication  Sig Dispense Refill Last Dose  . aspirin 81 MG chewable tablet Chew by mouth daily.   Taking  . Cholecalciferol (VITAMIN D) 2000 UNITS CAPS Take 2,000 Units by mouth daily.   Taking  . diphenhydrAMINE (BENADRYL) 50 MG capsule Take 50 mg by mouth at bedtime as needed for sleep.   Taking  . enoxaparin (LOVENOX) 40 MG/0.4ML injection Inject 0.4 mLs (40 mg total) into the skin daily. 30 Syringe 6 Taking  . insulin lispro (HUMALOG) 100 UNIT/ML injection 4 units pre breakfast and 6 units pre supper or as directed   Taking  . insulin NPH Human (HUMULIN N,NOVOLIN N) 100 UNIT/ML injection Inject 0.4 mLs (40 Units total) into the skin at bedtime. (Patient taking differently: Inject 80 Units into the skin at bedtime. ) 10 mL 5 Taking  . Iron, Ferrous Gluconate, 256 (28 Fe) MG TABS 1 tablet BID 60 tablet 3 Taking  . labetalol (NORMODYNE) 200 MG tablet TAKE 1 TABLET BY MOUTH TWICE DAILY 60 tablet 0 Taking  . levothyroxine (SYNTHROID, LEVOTHROID) 100 MCG tablet Take 100 mcg by mouth daily before breakfast.   Taking  . levothyroxine (SYNTHROID,  LEVOTHROID) 50 MCG tablet   3 Taking  . Melatonin 3 MG CAPS Take 3 mg by mouth at bedtime as needed (sleep).   Taking  . metFORMIN (GLUCOPHAGE) 1000 MG tablet Take 1,000 mg by mouth 2 (two) times daily with a meal.   Taking  . metroNIDAZOLE (FLAGYL) 500 MG tablet Take 2 Gm PO at once 4 tablet 0 Taking  . Prenatal Vit-Fe Fumarate-FA (PRENATAL MULTIVITAMIN) TABS tablet Take 1 tablet by mouth daily at 12 noon.   Taking  . vitamin B-12 (CYANOCOBALAMIN) 100 MCG tablet Take 100 mcg by mouth daily.   Taking    ROS:  Review of Systems  Constitutional:       Hypertension  Respiratory: Negative.   Cardiovascular: Negative.   Gastrointestinal: Negative.   Genitourinary: Negative.   Musculoskeletal: Negative.   Neurological: Positive for headaches. Negative for dizziness, weakness and light-headedness.   I have reviewed patient's Past Medical Hx, Surgical Hx, Family Hx,  Social Hx, medications and allergies.   Physical Exam   Patient Vitals for the past 24 hrs:  BP Temp Temp src Pulse Resp SpO2 Height Weight  06/13/18 2246 (!) 125/36 - - 99 - - - -  06/13/18 2231 122/63 - - 81 - - - -  06/13/18 2216 131/67 - - 91 - - - -  06/13/18 2201 (!) 146/64 - - 90 - - - -  06/13/18 2146 134/68 - - 81 - - - -  06/13/18 2131 133/68 - - 83 - - - -  06/13/18 2116 139/79 - - 85 - - - -  06/13/18 2105 (!) 143/76 - - 90 - - - -  06/13/18 2057 (!) 147/76 - - 90 - - - -  06/13/18 1935 139/75 98.9 F (37.2 C) Oral 85 19 100 % 5\' 1"  (1.549 m) 108.4 kg   Constitutional: Well-developed, obese female in no acute distress.  Cardiovascular: normal rate Respiratory: normal effort GI: Abd soft, non-tender, gravid large for gestational age.  MS: Extremities nontender, no edema, normal ROM Neurologic: Alert and oriented x 4.  GU: Neg CVAT. PELVIC EXAM: deferred  FHT:  Baseline 145 , moderate variability, accelerations present, no decelerations Contractions: none   Labs: Results for orders placed or performed during the hospital encounter of 06/13/18 (from the past 24 hour(s))  CBC     Status: Abnormal   Collection Time: 06/13/18  9:01 PM  Result Value Ref Range   WBC 11.0 (H) 4.0 - 10.5 K/uL   RBC 3.84 (L) 3.87 - 5.11 MIL/uL   Hemoglobin 12.4 12.0 - 15.0 g/dL   HCT 40.935.7 (L) 81.136.0 - 91.446.0 %   MCV 93.0 78.0 - 100.0 fL   MCH 32.3 26.0 - 34.0 pg   MCHC 34.7 30.0 - 36.0 g/dL   RDW 78.214.0 95.611.5 - 21.315.5 %   Platelets 215 150 - 400 K/uL  Comprehensive metabolic panel     Status: Abnormal   Collection Time: 06/13/18  9:01 PM  Result Value Ref Range   Sodium 135 135 - 145 mmol/L   Potassium 3.8 3.5 - 5.1 mmol/L   Chloride 107 98 - 111 mmol/L   CO2 18 (L) 22 - 32 mmol/L   Glucose, Bld 145 (H) 70 - 99 mg/dL   BUN 8 6 - 20 mg/dL   Creatinine, Ser 0.860.60 0.44 - 1.00 mg/dL   Calcium 9.0 8.9 - 57.810.3 mg/dL   Total Protein 5.9 (L) 6.5 - 8.1 g/dL   Albumin 2.8 (L) 3.5 -  5.0 g/dL   AST  16 15 - 41 U/L   ALT 16 0 - 44 U/L   Alkaline Phosphatase 86 38 - 126 U/L   Total Bilirubin 0.6 0.3 - 1.2 mg/dL   GFR calc non Af Amer >60 >60 mL/min   GFR calc Af Amer >60 >60 mL/min   Anion gap 10 5 - 15  Urinalysis, Routine w reflex microscopic     Status: None   Collection Time: 06/13/18  9:06 PM  Result Value Ref Range   Color, Urine YELLOW YELLOW   APPearance CLEAR CLEAR   Specific Gravity, Urine 1.006 1.005 - 1.030   pH 6.0 5.0 - 8.0   Glucose, UA NEGATIVE NEGATIVE mg/dL   Hgb urine dipstick NEGATIVE NEGATIVE   Bilirubin Urine NEGATIVE NEGATIVE   Ketones, ur NEGATIVE NEGATIVE mg/dL   Protein, ur NEGATIVE NEGATIVE mg/dL   Nitrite NEGATIVE NEGATIVE   Leukocytes, UA NEGATIVE NEGATIVE  Protein / creatinine ratio, urine     Status: None   Collection Time: 06/13/18  9:06 PM  Result Value Ref Range   Creatinine, Urine 35.00 mg/dL   Total Protein, Urine <6 mg/dL   Protein Creatinine Ratio        0.00 - 0.15 mg/mg[Cre]   AB/RH(D) POSITIVE/-- (02/21 1517)  MAU Course/MDM: Orders Placed This Encounter  Procedures  . Urinalysis, Routine w reflex microscopic  . CBC  . Comprehensive metabolic panel  . Protein / creatinine ratio, urine   UA- negative  PEC labs negative   Meds ordered this encounter  Medications  . acetaminophen (TYLENOL) tablet 1,000 mg   NST reviewed- reactive for gestational age  Treatments in MAU included 1000mg  Tylenol for HA. Patient reports decreased pain to 2/10 with medication treatment  Lab results reviewed with patient  Discussed reasons to return to MAU   Follow up as scheduled for prenatal appointments with IOL scheduled for 8/28   Pt discharge with strict PEC precautions.  Assessment: 1. Maternal chronic hypertension in third trimester   2. [redacted] weeks gestation of pregnancy   3. Type 2 diabetes mellitus without complication, with long-term current use of insulin (HCC)     Plan: Discharge home Labor precautions and fetal kick  counts Return to MAU as needed for s/s of PEC or labor eval Follow up as scheduled for prenatal appointment and return for IOL on 8/28 Continue taking Labetalol medication    Allergies as of 06/13/2018      Reactions   Bactrim [sulfamethoxazole-trimethoprim] Rash      Medication List    STOP taking these medications   metroNIDAZOLE 500 MG tablet Commonly known as:  FLAGYL     TAKE these medications   aspirin 81 MG chewable tablet Chew by mouth daily.   diphenhydrAMINE 50 MG capsule Commonly known as:  BENADRYL Take 50 mg by mouth at bedtime as needed for sleep.   enoxaparin 40 MG/0.4ML injection Commonly known as:  LOVENOX Inject 0.4 mLs (40 mg total) into the skin daily.   HUMALOG 100 UNIT/ML injection Generic drug:  insulin lispro 4 units pre breakfast and 6 units pre supper or as directed   insulin NPH Human 100 UNIT/ML injection Commonly known as:  HUMULIN N,NOVOLIN N Inject 0.4 mLs (40 Units total) into the skin at bedtime. What changed:  how much to take   Iron (Ferrous Gluconate) 256 (28 Fe) MG Tabs 1 tablet BID   labetalol 200 MG tablet Commonly known as:  NORMODYNE TAKE 1 TABLET BY  MOUTH TWICE DAILY   levothyroxine 100 MCG tablet Commonly known as:  SYNTHROID, LEVOTHROID Take 100 mcg by mouth daily before breakfast.   levothyroxine 50 MCG tablet Commonly known as:  SYNTHROID, LEVOTHROID   Melatonin 3 MG Caps Take 3 mg by mouth at bedtime as needed (sleep).   metFORMIN 1000 MG tablet Commonly known as:  GLUCOPHAGE Take 1,000 mg by mouth 2 (two) times daily with a meal.   prenatal multivitamin Tabs tablet Take 1 tablet by mouth daily at 12 noon.   vitamin B-12 100 MCG tablet Commonly known as:  CYANOCOBALAMIN Take 100 mcg by mouth daily.   Vitamin D 2000 units Caps Take 2,000 Units by mouth daily.       Steward Drone Certified Nurse-Midwife 06/13/2018 11:25 PM

## 2018-06-14 ENCOUNTER — Other Ambulatory Visit: Payer: Self-pay | Admitting: Certified Nurse Midwife

## 2018-06-14 LAB — URINE CYTOLOGY ANCILLARY ONLY
CHLAMYDIA, DNA PROBE: NEGATIVE
Neisseria Gonorrhea: NEGATIVE

## 2018-06-15 ENCOUNTER — Encounter (HOSPITAL_COMMUNITY): Payer: Self-pay

## 2018-06-15 ENCOUNTER — Telehealth (HOSPITAL_COMMUNITY): Payer: Self-pay | Admitting: *Deleted

## 2018-06-15 ENCOUNTER — Ambulatory Visit (HOSPITAL_COMMUNITY)
Admission: RE | Admit: 2018-06-15 | Discharge: 2018-06-15 | Disposition: A | Discharge status: Home / Self Care | Payer: PRIVATE HEALTH INSURANCE | Source: Ambulatory Visit | Attending: Obstetrics & Gynecology | Admitting: Obstetrics & Gynecology

## 2018-06-15 DIAGNOSIS — Z362 Encounter for other antenatal screening follow-up: Secondary | ICD-10-CM | POA: Insufficient documentation

## 2018-06-15 DIAGNOSIS — O24112 Pre-existing diabetes mellitus, type 2, in pregnancy, second trimester: Secondary | ICD-10-CM

## 2018-06-15 DIAGNOSIS — O24113 Pre-existing diabetes mellitus, type 2, in pregnancy, third trimester: Secondary | ICD-10-CM | POA: Diagnosis not present

## 2018-06-15 DIAGNOSIS — O99113 Other diseases of the blood and blood-forming organs and certain disorders involving the immune mechanism complicating pregnancy, third trimester: Secondary | ICD-10-CM | POA: Diagnosis not present

## 2018-06-15 DIAGNOSIS — O09529 Supervision of elderly multigravida, unspecified trimester: Secondary | ICD-10-CM

## 2018-06-15 DIAGNOSIS — O0990 Supervision of high risk pregnancy, unspecified, unspecified trimester: Secondary | ICD-10-CM

## 2018-06-15 DIAGNOSIS — Z3A36 36 weeks gestation of pregnancy: Secondary | ICD-10-CM | POA: Insufficient documentation

## 2018-06-15 DIAGNOSIS — O09523 Supervision of elderly multigravida, third trimester: Secondary | ICD-10-CM | POA: Insufficient documentation

## 2018-06-15 DIAGNOSIS — O10913 Unspecified pre-existing hypertension complicating pregnancy, third trimester: Secondary | ICD-10-CM | POA: Diagnosis not present

## 2018-06-15 DIAGNOSIS — O99280 Endocrine, nutritional and metabolic diseases complicating pregnancy, unspecified trimester: Secondary | ICD-10-CM

## 2018-06-15 DIAGNOSIS — O10919 Unspecified pre-existing hypertension complicating pregnancy, unspecified trimester: Secondary | ICD-10-CM

## 2018-06-15 DIAGNOSIS — O289 Unspecified abnormal findings on antenatal screening of mother: Secondary | ICD-10-CM | POA: Diagnosis not present

## 2018-06-15 DIAGNOSIS — E039 Hypothyroidism, unspecified: Secondary | ICD-10-CM

## 2018-06-15 DIAGNOSIS — D6861 Antiphospholipid syndrome: Secondary | ICD-10-CM | POA: Diagnosis not present

## 2018-06-15 DIAGNOSIS — O99283 Endocrine, nutritional and metabolic diseases complicating pregnancy, third trimester: Secondary | ICD-10-CM | POA: Insufficient documentation

## 2018-06-15 DIAGNOSIS — O10013 Pre-existing essential hypertension complicating pregnancy, third trimester: Secondary | ICD-10-CM | POA: Insufficient documentation

## 2018-06-15 IMAGING — US US MFM FETAL BPP W/O NON-STRESS
1 series · 13 of 28 positions shown · non-contrast
Comparison: none

[Series 1: us mfm fetal bpp w/o non-stress · 28 acquisitions, 13 frames shown]
[im 2/28]
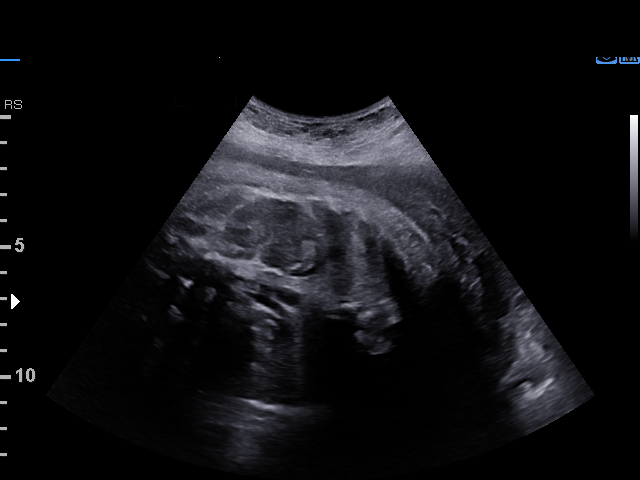
[im 4/28]
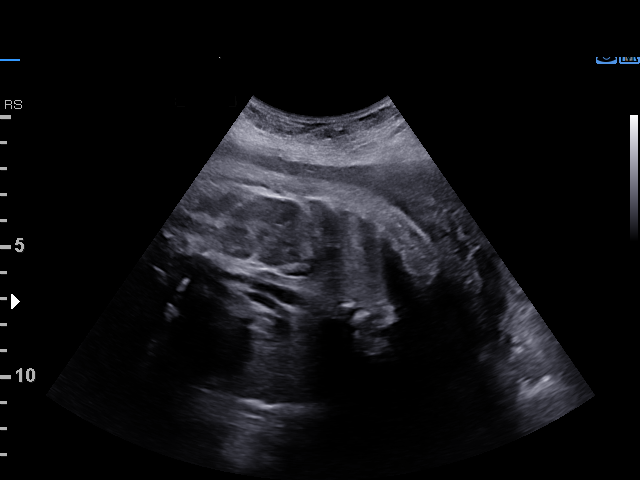
[im 6/28]
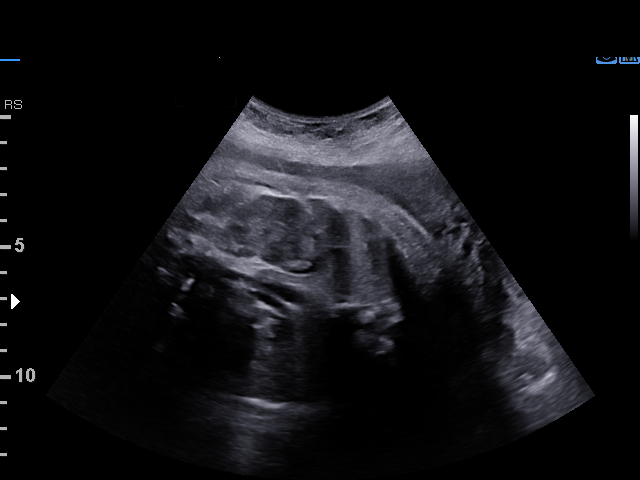
[im 8/28]
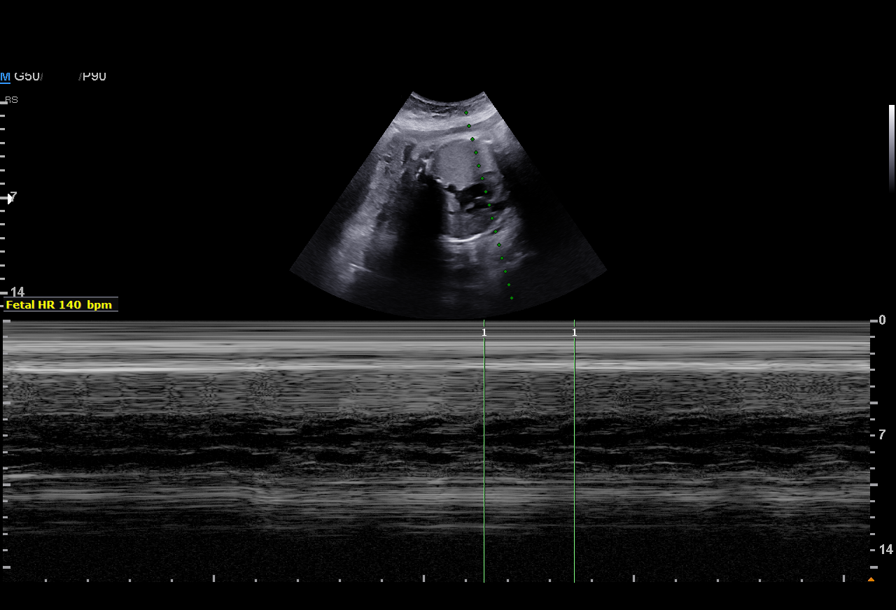
[im 10/28]
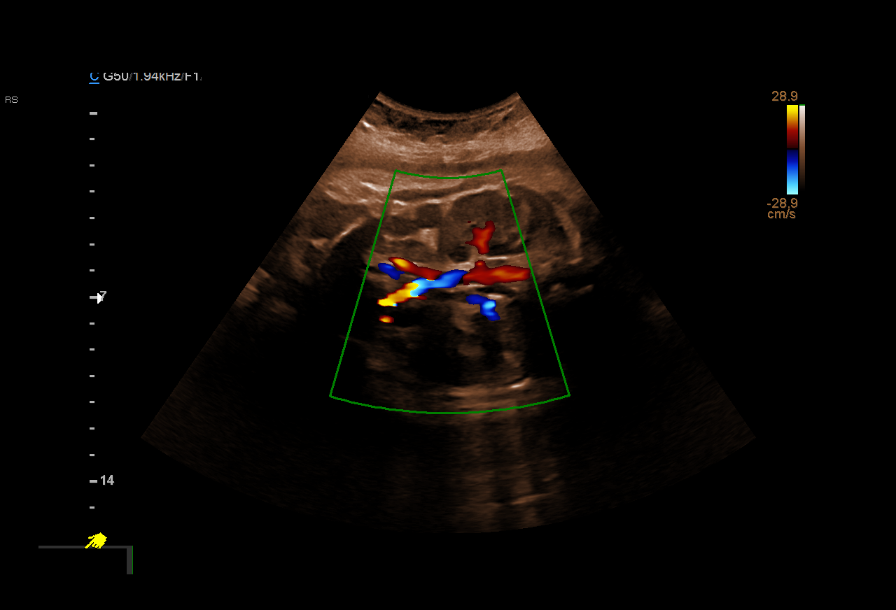
[im 12/28]
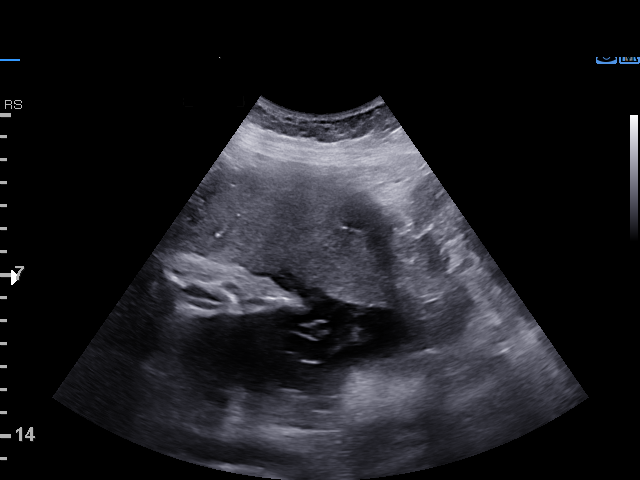
[im 15/28]
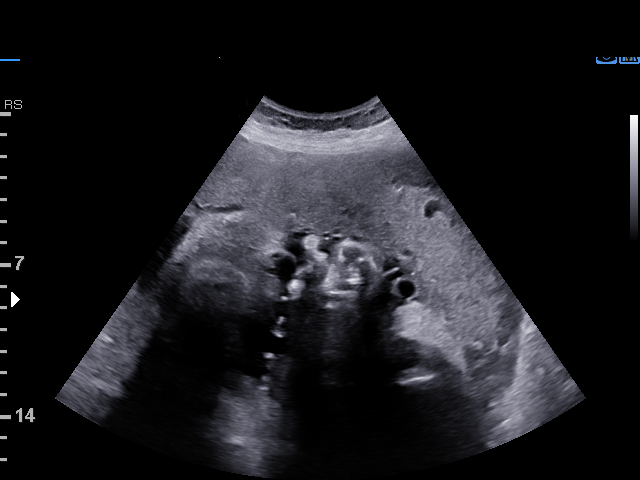
[im 17/28]
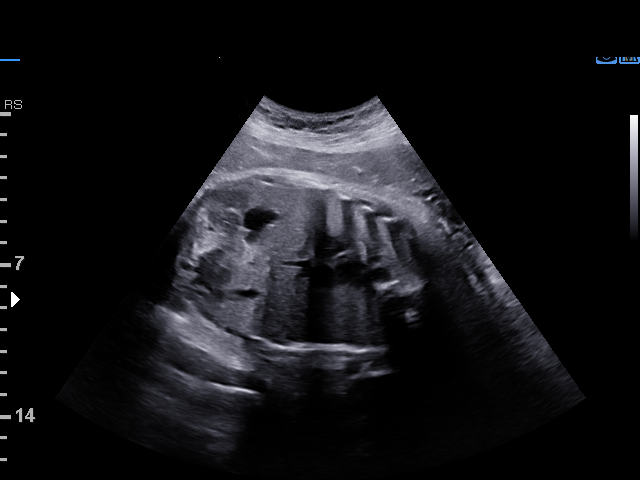
[im 19/28]
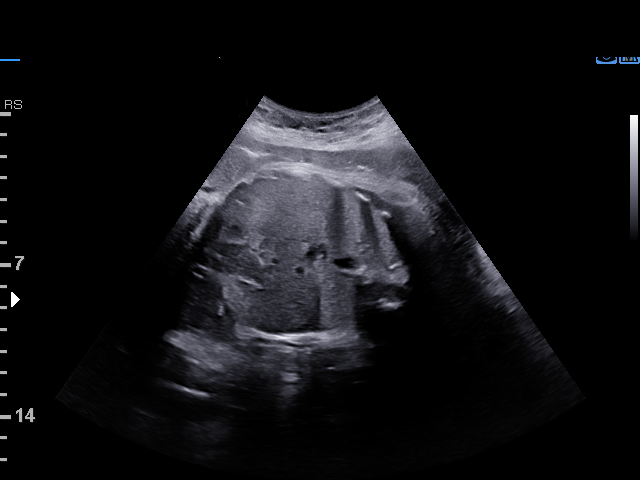
[im 21/28]
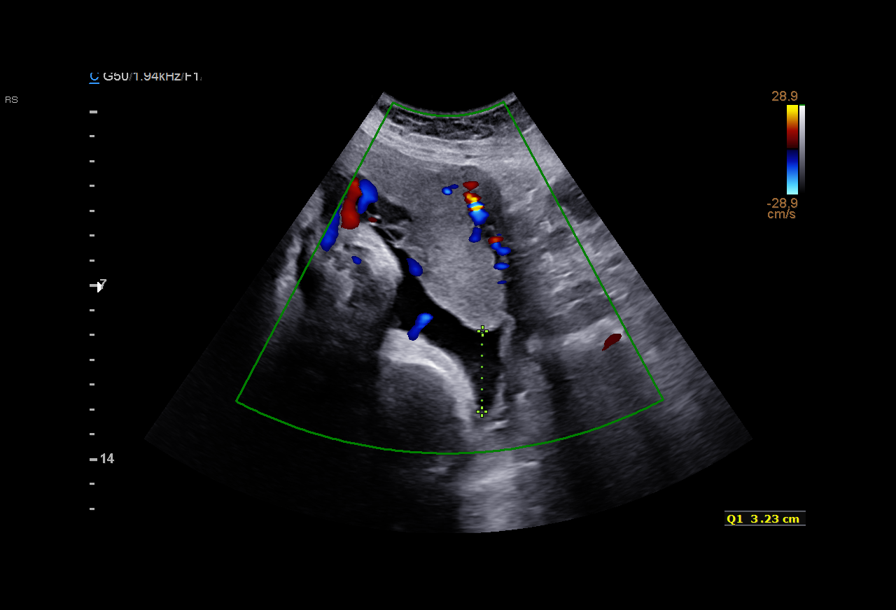
[im 23/28]
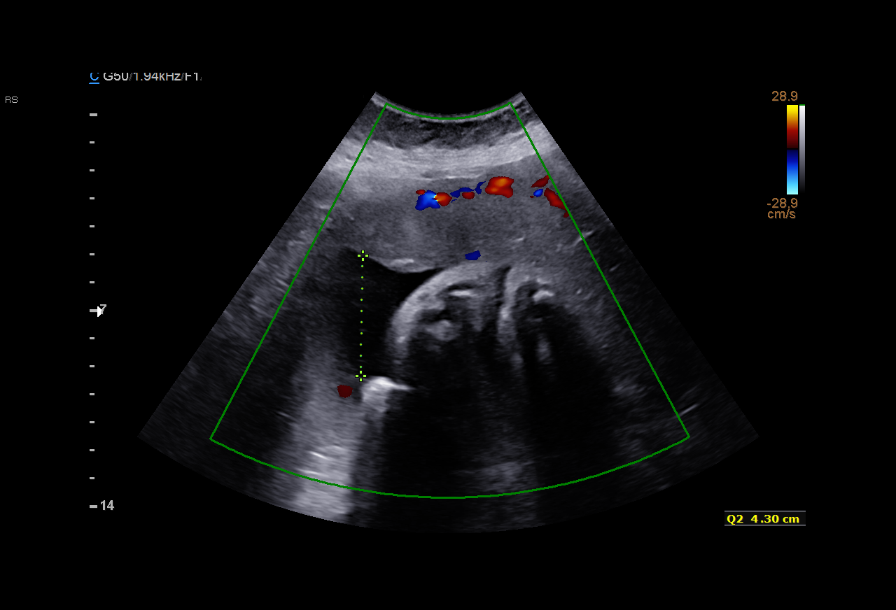
[im 25/28]
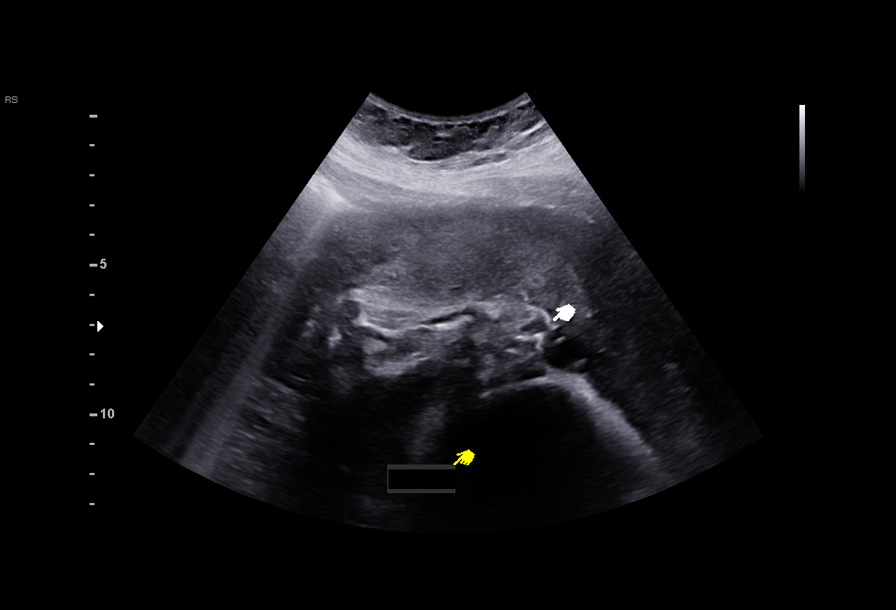
[im 27/28]
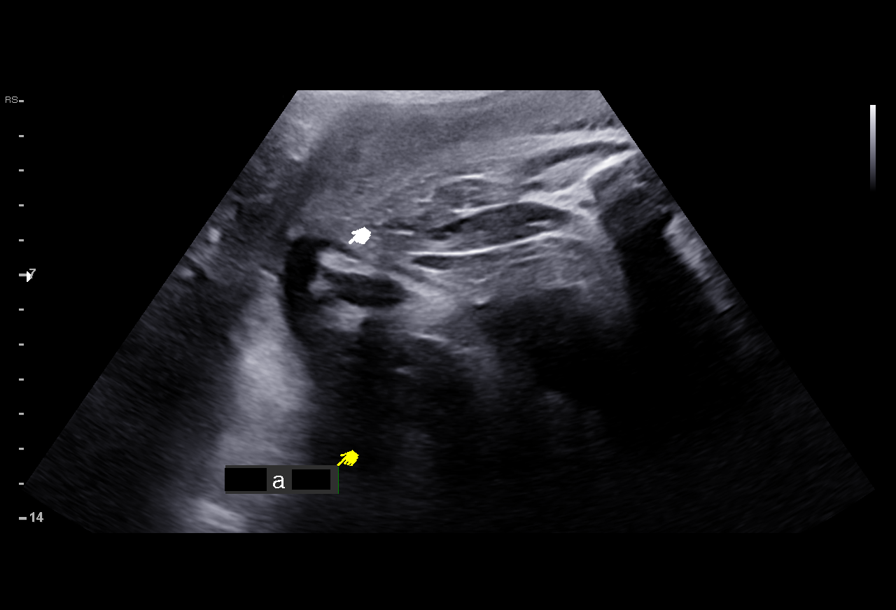

[13 of 28 positions shown; findings below may reference images not displayed]

OB/Gyn Clinic

Indications

36 weeks gestation of pregnancy
Advanced maternal age multigravida 35+,        [JG]
third trimester (low FF NIPS; declined
additional testing
Obesity complicating pregnancy, third          [JG]
trimester
Hypothyroid (synthroid)                        [JG] [JG]
Hypertension - Chronic/Pre-existing            [JG]
(labetalol)
Antiphospholipid syndrome complicating         O99.119, [JG]
pregnancy, antepartum (Lovenox)
Encounter for other antenatal screening        [JG]
follow-up
Vital Signs

Height:        5'1"
Fetal Evaluation

Num Of Fetuses:         1
Fetal Heart Rate(bpm):  140
Cardiac Activity:       Observed
Presentation:           Cephalic

Amniotic Fluid
AFI FV:      Within normal limits
AFI Sum(cm)     %Tile       Largest Pocket(cm)
9.43            18

RUQ(cm)                     LUQ(cm)        LLQ(cm)
3.23
Biophysical Evaluation

Amniotic F.V:   Pocket => 2 cm two         F. Tone:        Observed
planes
F. Movement:    Observed                   Score:          [DATE]
F. Breathing:   Observed
OB History

Gravidity:    4          SAB:   3
Living:       0
Gestational Age

LMP:           36w 2d        Date:  [DATE]                 EDD:   [DATE]
Best:          36w 2d     Det. By:  LMP  ([DATE])          EDD:   [DATE]
Anatomy

Stomach:               Appears normal, left   Bladder:                Appears normal
sided
Kidneys:               Appear normal

Other:  Fetus appears to be a female.
Impression

Amniotic fluid is normal and good fetal activity is seen.
Antenatal testing is reassuring. BPP [DATE].

Patient reports she will be undergoing induction of labor next
week.

## 2018-06-15 NOTE — Telephone Encounter (Signed)
Preadmission screen  

## 2018-06-16 LAB — CULTURE, BETA STREP (GROUP B ONLY)
MICRO NUMBER:: 90984789
SPECIMEN QUALITY:: ADEQUATE

## 2018-06-17 ENCOUNTER — Encounter: Payer: Self-pay | Admitting: Obstetrics & Gynecology

## 2018-06-17 DIAGNOSIS — O99820 Streptococcus B carrier state complicating pregnancy: Secondary | ICD-10-CM | POA: Insufficient documentation

## 2018-06-21 ENCOUNTER — Ambulatory Visit (INDEPENDENT_AMBULATORY_CARE_PROVIDER_SITE_OTHER): Payer: PRIVATE HEALTH INSURANCE | Admitting: Obstetrics & Gynecology

## 2018-06-21 ENCOUNTER — Encounter (HOSPITAL_COMMUNITY): Payer: Self-pay

## 2018-06-21 ENCOUNTER — Ambulatory Visit (HOSPITAL_COMMUNITY): Payer: PRIVATE HEALTH INSURANCE

## 2018-06-21 VITALS — BP 155/78 | HR 90 | Wt 240.0 lb

## 2018-06-21 DIAGNOSIS — O99280 Endocrine, nutritional and metabolic diseases complicating pregnancy, unspecified trimester: Secondary | ICD-10-CM

## 2018-06-21 DIAGNOSIS — O24112 Pre-existing diabetes mellitus, type 2, in pregnancy, second trimester: Secondary | ICD-10-CM

## 2018-06-21 DIAGNOSIS — O10919 Unspecified pre-existing hypertension complicating pregnancy, unspecified trimester: Secondary | ICD-10-CM

## 2018-06-21 DIAGNOSIS — O0990 Supervision of high risk pregnancy, unspecified, unspecified trimester: Secondary | ICD-10-CM

## 2018-06-21 DIAGNOSIS — O99820 Streptococcus B carrier state complicating pregnancy: Secondary | ICD-10-CM

## 2018-06-21 DIAGNOSIS — E039 Hypothyroidism, unspecified: Secondary | ICD-10-CM

## 2018-06-21 DIAGNOSIS — D6861 Antiphospholipid syndrome: Secondary | ICD-10-CM

## 2018-06-21 DIAGNOSIS — O99210 Obesity complicating pregnancy, unspecified trimester: Secondary | ICD-10-CM

## 2018-06-21 DIAGNOSIS — O09523 Supervision of elderly multigravida, third trimester: Secondary | ICD-10-CM

## 2018-06-21 NOTE — Progress Notes (Signed)
   PRENATAL VISIT NOTE  Subjective:  Kelli Miller is a 37 y.o. G4P0030 at 4960w1d being seen today for ongoing prenatal care.  She is currently monitored for the following issues for this high-risk pregnancy and has Supervision of high risk pregnancy, antepartum; Pre-existing type 2 diabetes mellitus in pregnancy in second trimester; Hypothyroidism affecting pregnancy, antepartum; Advanced maternal age in multigravida; Obesity in pregnancy; Chronic hypertension in pregnancy; Abnormal antenatal test; PCOS (polycystic ovarian syndrome); GERD (gastroesophageal reflux disease); Psoriasis; Recurrent pregnancy loss; Antiphospholipid antibody syndrome (HCC); Elevated TSH; Hypomenorrhea/oligomenorrhea; Trichomoniasis; and GBS (group B Streptococcus carrier), +RV culture, currently pregnant on their problem list.  Patient reports no complaints.   .  .   . Denies leaking of fluid.   The following portions of the patient's history were reviewed and updated as appropriate: allergies, current medications, past family history, past medical history, past social history, past surgical history and problem list. Problem list updated.  Objective:  There were no vitals filed for this visit.  Fetal Status:           General:  Alert, oriented and cooperative. Patient is in no acute distress.  Skin: Skin is warm and dry. No rash noted.   Cardiovascular: Normal heart rate noted  Respiratory: Normal respiratory effort, no problems with respiration noted  Abdomen: Soft, gravid, appropriate for gestational age.        Pelvic: Cervical exam performed        Extremities: Normal range of motion.     Mental Status: Normal mood and affect. Normal behavior. Normal judgment and thought content.   Assessment and Plan:  Pregnancy: G4P0030 at 5260w1d  1. Multigravida of advanced maternal age in third trimester - She is here for Foley bulb insertion as her IOL is tonight at 23:45. However, even with extensive pressure, I was not  able to get her cervix to open enough to place a Foley.   2. Supervision of high risk pregnancy, antepartum   3. Pre-existing type 2 diabetes mellitus in pregnancy in second trimester   4. Obesity in pregnancy   5. Hypothyroidism affecting pregnancy, antepartum   6. GBS (group B Streptococcus carrier), +RV culture, currently pregnant - treat in labor  7. Chronic hypertension in pregnancy   8. Antiphospholipid antibody syndrome (HCC  Term labor symptoms and general obstetric precautions including but not limited to vaginal bleeding, contractions, leaking of fluid and fetal movement were reviewed in detail with the patient. Please refer to After Visit Summary for other counseling recommendations.  No follow-ups on file.  Future Appointments  Date Time Provider Department Center  06/22/2018 12:00 AM [facility] RO[facility] None    Allie BossierMyra C Malaquias Lenker, MD

## 2018-06-22 ENCOUNTER — Other Ambulatory Visit: Payer: Self-pay

## 2018-06-22 ENCOUNTER — Encounter (HOSPITAL_COMMUNITY): Payer: Self-pay

## 2018-06-22 ENCOUNTER — Inpatient Hospital Stay (HOSPITAL_COMMUNITY): Payer: PRIVATE HEALTH INSURANCE

## 2018-06-22 ENCOUNTER — Inpatient Hospital Stay (HOSPITAL_COMMUNITY)
Admission: RE | Admit: 2018-06-22 | Discharge: 2018-06-25 | DRG: 787 | Disposition: A | Discharge status: Home / Self Care | Payer: PRIVATE HEALTH INSURANCE | Attending: Obstetrics & Gynecology | Admitting: Obstetrics & Gynecology

## 2018-06-22 DIAGNOSIS — E282 Polycystic ovarian syndrome: Secondary | ICD-10-CM | POA: Diagnosis present

## 2018-06-22 DIAGNOSIS — Z794 Long term (current) use of insulin: Secondary | ICD-10-CM | POA: Diagnosis not present

## 2018-06-22 DIAGNOSIS — O1002 Pre-existing essential hypertension complicating childbirth: Secondary | ICD-10-CM | POA: Diagnosis present

## 2018-06-22 DIAGNOSIS — E119 Type 2 diabetes mellitus without complications: Secondary | ICD-10-CM | POA: Diagnosis present

## 2018-06-22 DIAGNOSIS — Z3A37 37 weeks gestation of pregnancy: Secondary | ICD-10-CM | POA: Diagnosis not present

## 2018-06-22 DIAGNOSIS — O10913 Unspecified pre-existing hypertension complicating pregnancy, third trimester: Secondary | ICD-10-CM | POA: Diagnosis not present

## 2018-06-22 DIAGNOSIS — O99284 Endocrine, nutritional and metabolic diseases complicating childbirth: Secondary | ICD-10-CM | POA: Diagnosis present

## 2018-06-22 DIAGNOSIS — O2412 Pre-existing diabetes mellitus, type 2, in childbirth: Secondary | ICD-10-CM | POA: Diagnosis present

## 2018-06-22 DIAGNOSIS — Z7982 Long term (current) use of aspirin: Secondary | ICD-10-CM

## 2018-06-22 DIAGNOSIS — D6861 Antiphospholipid syndrome: Secondary | ICD-10-CM | POA: Diagnosis present

## 2018-06-22 DIAGNOSIS — O9912 Other diseases of the blood and blood-forming organs and certain disorders involving the immune mechanism complicating childbirth: Secondary | ICD-10-CM | POA: Diagnosis present

## 2018-06-22 DIAGNOSIS — O24113 Pre-existing diabetes mellitus, type 2, in pregnancy, third trimester: Secondary | ICD-10-CM | POA: Diagnosis not present

## 2018-06-22 DIAGNOSIS — O99214 Obesity complicating childbirth: Secondary | ICD-10-CM | POA: Diagnosis present

## 2018-06-22 DIAGNOSIS — Z87891 Personal history of nicotine dependence: Secondary | ICD-10-CM | POA: Diagnosis not present

## 2018-06-22 DIAGNOSIS — O114 Pre-existing hypertension with pre-eclampsia, complicating childbirth: Secondary | ICD-10-CM | POA: Diagnosis present

## 2018-06-22 DIAGNOSIS — E039 Hypothyroidism, unspecified: Secondary | ICD-10-CM | POA: Diagnosis present

## 2018-06-22 DIAGNOSIS — A5901 Trichomonal vulvovaginitis: Secondary | ICD-10-CM | POA: Diagnosis present

## 2018-06-22 DIAGNOSIS — O9832 Other infections with a predominantly sexual mode of transmission complicating childbirth: Secondary | ICD-10-CM | POA: Diagnosis present

## 2018-06-22 DIAGNOSIS — I10 Essential (primary) hypertension: Secondary | ICD-10-CM

## 2018-06-22 DIAGNOSIS — O99824 Streptococcus B carrier state complicating childbirth: Secondary | ICD-10-CM | POA: Diagnosis present

## 2018-06-22 DIAGNOSIS — O24112 Pre-existing diabetes mellitus, type 2, in pregnancy, second trimester: Secondary | ICD-10-CM | POA: Diagnosis present

## 2018-06-22 DIAGNOSIS — O10919 Unspecified pre-existing hypertension complicating pregnancy, unspecified trimester: Secondary | ICD-10-CM | POA: Diagnosis present

## 2018-06-22 HISTORY — DX: Polycystic ovarian syndrome: E28.2

## 2018-06-22 HISTORY — DX: Antiphospholipid syndrome: D68.61

## 2018-06-22 LAB — WET PREP, GENITAL
Clue Cells Wet Prep HPF POC: NONE SEEN
Sperm: NONE SEEN
Trich, Wet Prep: NONE SEEN
Yeast Wet Prep HPF POC: NONE SEEN

## 2018-06-22 LAB — TYPE AND SCREEN
ABO/RH(D): AB POS
Antibody Screen: NEGATIVE

## 2018-06-22 LAB — COMPREHENSIVE METABOLIC PANEL
ALT: 19 U/L (ref 0–44)
ANION GAP: 11 (ref 5–15)
AST: 24 U/L (ref 15–41)
Albumin: 2.9 g/dL — ABNORMAL LOW (ref 3.5–5.0)
Alkaline Phosphatase: 98 U/L (ref 38–126)
BUN: 7 mg/dL (ref 6–20)
CHLORIDE: 106 mmol/L (ref 98–111)
CO2: 17 mmol/L — ABNORMAL LOW (ref 22–32)
CREATININE: 0.57 mg/dL (ref 0.44–1.00)
Calcium: 8.9 mg/dL (ref 8.9–10.3)
Glucose, Bld: 166 mg/dL — ABNORMAL HIGH (ref 70–99)
Potassium: 3.5 mmol/L (ref 3.5–5.1)
Sodium: 134 mmol/L — ABNORMAL LOW (ref 135–145)
Total Bilirubin: 0.6 mg/dL (ref 0.3–1.2)
Total Protein: 6 g/dL — ABNORMAL LOW (ref 6.5–8.1)

## 2018-06-22 LAB — GLUCOSE, CAPILLARY
GLUCOSE-CAPILLARY: 153 mg/dL — AB (ref 70–99)
Glucose-Capillary: 110 mg/dL — ABNORMAL HIGH (ref 70–99)
Glucose-Capillary: 129 mg/dL — ABNORMAL HIGH (ref 70–99)
Glucose-Capillary: 81 mg/dL (ref 70–99)
Glucose-Capillary: 91 mg/dL (ref 70–99)

## 2018-06-22 LAB — RPR: RPR Ser Ql: NONREACTIVE

## 2018-06-22 LAB — CBC
HCT: 35.6 % — ABNORMAL LOW (ref 36.0–46.0)
Hemoglobin: 12.3 g/dL (ref 12.0–15.0)
MCH: 32.4 pg (ref 26.0–34.0)
MCHC: 34.6 g/dL (ref 30.0–36.0)
MCV: 93.7 fL (ref 78.0–100.0)
Platelets: 236 K/uL (ref 150–400)
RBC: 3.8 MIL/uL — ABNORMAL LOW (ref 3.87–5.11)
RDW: 14.1 % (ref 11.5–15.5)
WBC: 12.6 K/uL — ABNORMAL HIGH (ref 4.0–10.5)

## 2018-06-22 LAB — PROTEIN / CREATININE RATIO, URINE
Creatinine, Urine: 43 mg/dL
Protein Creatinine Ratio: 0.14 mg/mg{creat} (ref 0.00–0.15)
Total Protein, Urine: 6 mg/dL

## 2018-06-22 LAB — ABO/RH: ABO/RH(D): AB POS

## 2018-06-22 MED ORDER — INSULIN ASPART 100 UNIT/ML ~~LOC~~ SOLN: 0.0000 [IU] | Freq: Every day | SUBCUTANEOUS | Status: DC

## 2018-06-22 MED ORDER — LACTATED RINGERS IV SOLN
INTRAVENOUS | Status: DC
  Administered 2018-06-22 – 2018-06-23 (×6): via INTRAVENOUS

## 2018-06-22 MED ORDER — INSULIN ASPART 100 UNIT/ML ~~LOC~~ SOLN: 0.0000 [IU] | Freq: Three times a day (TID) | SUBCUTANEOUS | Status: DC

## 2018-06-22 MED ORDER — OXYCODONE-ACETAMINOPHEN 5-325 MG PO TABS: 1.0000 | ORAL_TABLET | ORAL | Status: DC | PRN

## 2018-06-22 MED ORDER — LACTATED RINGERS IV SOLN: 500.0000 mL | INTRAVENOUS | Status: DC | PRN

## 2018-06-22 MED ORDER — OXYTOCIN BOLUS FROM INFUSION: 500.0000 mL | Freq: Once | INTRAVENOUS | Status: DC

## 2018-06-22 MED ORDER — MISOPROSTOL 25 MCG QUARTER TABLET
25.0000 ug | ORAL_TABLET | ORAL | Status: DC | PRN
  Filled 2018-06-22: qty 1

## 2018-06-22 MED ORDER — LABETALOL HCL 5 MG/ML IV SOLN: 40.0000 mg | INTRAVENOUS | Status: DC | PRN

## 2018-06-22 MED ORDER — SOD CITRATE-CITRIC ACID 500-334 MG/5ML PO SOLN
30.0000 mL | ORAL | Status: DC | PRN
  Administered 2018-06-23: 30 mL via ORAL
  Filled 2018-06-22: qty 15

## 2018-06-22 MED ORDER — SODIUM CHLORIDE 0.9 % IV SOLN
5.0000 10*6.[IU] | Freq: Once | INTRAVENOUS | Status: AC
  Administered 2018-06-22: 5 10*6.[IU] via INTRAVENOUS
  Filled 2018-06-22: qty 5

## 2018-06-22 MED ORDER — HYDRALAZINE HCL 20 MG/ML IJ SOLN: 10.0000 mg | INTRAMUSCULAR | Status: DC | PRN

## 2018-06-22 MED ORDER — INSULIN ASPART 100 UNIT/ML ~~LOC~~ SOLN
0.0000 [IU] | Freq: Three times a day (TID) | SUBCUTANEOUS | Status: DC
  Administered 2018-06-22: 2 [IU] via SUBCUTANEOUS

## 2018-06-22 MED ORDER — ONDANSETRON HCL 4 MG/2ML IJ SOLN: 4.0000 mg | Freq: Four times a day (QID) | INTRAMUSCULAR | Status: DC | PRN

## 2018-06-22 MED ORDER — HYDRALAZINE HCL 20 MG/ML IJ SOLN
5.0000 mg | INTRAMUSCULAR | Status: DC | PRN
  Administered 2018-06-22: 5 mg via INTRAVENOUS
  Filled 2018-06-22: qty 1

## 2018-06-22 MED ORDER — OXYCODONE-ACETAMINOPHEN 5-325 MG PO TABS: 2.0000 | ORAL_TABLET | ORAL | Status: DC | PRN

## 2018-06-22 MED ORDER — OXYTOCIN 40 UNITS IN LACTATED RINGERS INFUSION - SIMPLE MED
2.5000 [IU]/h | INTRAVENOUS | Status: DC
  Filled 2018-06-22: qty 1000

## 2018-06-22 MED ORDER — LEVOTHYROXINE SODIUM 50 MCG PO TABS
50.0000 ug | ORAL_TABLET | Freq: Every day | ORAL | Status: DC
  Administered 2018-06-22 – 2018-06-23 (×2): 50 ug via ORAL
  Filled 2018-06-22 (×3): qty 1

## 2018-06-22 MED ORDER — LABETALOL HCL 5 MG/ML IV SOLN
INTRAVENOUS | Status: AC
  Administered 2018-06-22: 20 mg via INTRAVENOUS
  Filled 2018-06-22: qty 4

## 2018-06-22 MED ORDER — LABETALOL HCL 200 MG PO TABS
200.0000 mg | ORAL_TABLET | Freq: Two times a day (BID) | ORAL | Status: DC
  Administered 2018-06-22 – 2018-06-23 (×3): 200 mg via ORAL
  Filled 2018-06-22 (×3): qty 1

## 2018-06-22 MED ORDER — TERBUTALINE SULFATE 1 MG/ML IJ SOLN: 0.2500 mg | Freq: Once | INTRAMUSCULAR | Status: DC | PRN

## 2018-06-22 MED ORDER — LIDOCAINE HCL (PF) 1 % IJ SOLN: 30.0000 mL | INTRAMUSCULAR | Status: DC | PRN

## 2018-06-22 MED ORDER — FENTANYL CITRATE (PF) 100 MCG/2ML IJ SOLN: 100.0000 ug | INTRAMUSCULAR | Status: DC | PRN

## 2018-06-22 MED ORDER — LABETALOL HCL 5 MG/ML IV SOLN
20.0000 mg | INTRAVENOUS | Status: DC | PRN
  Administered 2018-06-22: 20 mg via INTRAVENOUS

## 2018-06-22 MED ORDER — ACETAMINOPHEN 325 MG PO TABS
650.0000 mg | ORAL_TABLET | ORAL | Status: DC | PRN
  Administered 2018-06-22 – 2018-06-23 (×3): 650 mg via ORAL
  Filled 2018-06-22 (×3): qty 2

## 2018-06-22 MED ORDER — MISOPROSTOL 50MCG HALF TABLET
50.0000 ug | ORAL_TABLET | ORAL | Status: DC | PRN
  Administered 2018-06-22 (×2): 50 ug via ORAL
  Filled 2018-06-22 (×3): qty 1

## 2018-06-22 MED ORDER — PENICILLIN G 3 MILLION UNITS IVPB - SIMPLE MED
3.0000 10*6.[IU] | INTRAVENOUS | Status: DC
  Administered 2018-06-22 – 2018-06-23 (×7): 3 10*6.[IU] via INTRAVENOUS
  Filled 2018-06-22 (×4): qty 3
  Filled 2018-06-22 (×2): qty 100
  Filled 2018-06-22: qty 3
  Filled 2018-06-22 (×3): qty 100

## 2018-06-22 NOTE — Progress Notes (Signed)
[facility] OB/GYN Attending Note  37 y.o. G4P0030 at 3662w2d with chronic hypertension that is well-controlled on Labetalol 200 gm bid, without superimposed preeclampsia; T2DM on insulin and antiphospholipid syndrome on Lovenox, here for scheduled induction of labor.  She denies any symptoms concerning for preeclampsia, labs pending at this time. BP stable.  DM controlled on insulin. Category I FHR tracing with active fetal movement, no contractions noted on tocometer. No VB, LOF. EFW at 34 weeks was 44%.  Last BPP on 8/21 was 8/8. No indication currently seen for induction of labor at 37 weeks, but patient maintains she was told by her primary OB and MFM (Dr. Judeth CornfieldShankar) that she needs delivery at 37 weeks due to her chronic hypertension. There was no documentation specifically attesting to this.  Patient and her husband are frustrated, they report this similar incident occurred last week when she was sent in for induction due to concern for possible preeclampsia, but his was ruled out in the MAU.  She feels we are not being consistent and wanted me to contract her primary OB provider.  Patient and her husband were informed that her primary OB provider will be notified later this morning; but more importantly, a MFM consult and BPP was requested.  Will follow up Dr. Zannie KehrShankar's recommendations.  In the meantime, she will be observed closely and will follow up her laboratory evaluation. If there is any evidence of preeclampsia, or other concerning maternal-fetal condition needing delivery, will proceed with IOL or other delivery plan as indicated.  Will keep inpatient for now, but defer any induction activity for now.     Kelli Miller  Kelli Ackerley, MD, FACOG Obstetrician & Gynecologist, [facility], [facility]

## 2018-06-22 NOTE — Progress Notes (Signed)
LABOR PROGRESS NOTE  Kelli Miller is a 37 y.o. G4P0030 at 4743w2d  admitted for IOL for cHTN, T2DM, and antiphospholipid syndrome.   Subjective: Patient reports feeling ctx in her back.   Objective: BP (!) 157/50   Pulse 78   Temp 98.9 F (37.2 C) (Oral)   Resp 16   Ht 5\' 1"  (1.549 m)   Wt 109.7 kg   LMP 10/04/2017   BMI 45.70 kg/m  or  Vitals:   06/22/18 2031 06/22/18 2046 06/22/18 2050 06/22/18 2131  BP: (!) 160/94 (!) 161/74 (!) 162/66 (!) 157/50  Pulse: 63 70 77 78  Resp:      Temp:      TempSrc:      Weight:      Height:        Dilation: Closed Effacement (%): Thick Cervical Position: Posterior Station: -3 Presentation: Vertex Exam by:: Dr Aggie HackerWallace FHT: baseline rate 140, moderate varibility, +acel, no decel Toco: q1-2 min  Labs: Lab Results  Component Value Date   WBC 12.6 (H) 06/22/2018   HGB 12.3 06/22/2018   HCT 35.6 (L) 06/22/2018   MCV 93.7 06/22/2018   PLT 236 06/22/2018    Patient Active Problem List   Diagnosis Date Noted  . GBS (group B Streptococcus carrier), +RV culture, currently pregnant 06/17/2018  . Trichomoniasis 05/30/2018  . PCOS (polycystic ovarian syndrome) 01/13/2018  . GERD (gastroesophageal reflux disease) 01/13/2018  . Psoriasis 01/13/2018  . Antiphospholipid antibody syndrome (HCC) 01/13/2018  . Abnormal antenatal test 01/07/2018  . Obesity in pregnancy 12/29/2017  . Chronic hypertension in pregnancy 12/29/2017  . Advanced maternal age in multigravida 12/27/2017  . Supervision of high risk pregnancy, antepartum 12/16/2017  . Pre-existing type 2 diabetes mellitus in pregnancy in second trimester 12/16/2017  . Hypothyroidism affecting pregnancy, antepartum 12/16/2017  . Recurrent pregnancy loss 01/16/2016  . Elevated TSH 01/16/2016  . Hypomenorrhea/oligomenorrhea 01/16/2016    Assessment / Plan: 37 y.o. G4P0030 at 6143w2d here for IOL for cHTN, T2DM, and antiphospholipid antibody syndrome.   CHTN: Severe range pressures  (162-170/66-94). Started hydralazine protocol. Patient due for Labetalol soon. Asymptomatic currently. PIH labs WNL.  T2DM: SSI. Last CBG 91.  APS: SCDs in place. Plan for Lovenox and ASA 6 wk PP.  Labor: No cervical progression. S/p Cytotec x2. Unable to give subsequent dose of Cytotec due to frequent ctx pattern. Attempted FB placement with speculum; however cervix very far posterior and unable to thread catheter through closed cervical os.  Fetal Wellbeing:  Cat I  Pain Control:  Epidural upon request  Anticipated MOD:  NSVD   Marcy Sirenatherine Jazzalyn Loewenstein, D.O. OB Fellow  06/22/2018, 9:41 PM

## 2018-06-22 NOTE — Progress Notes (Signed)
OB/GYN Faculty Practice: Labor Progress Note  Subjective: Patient is doing well. Here for induction due to chronic HTN, Type II DM, antiphospholipid syndrome. Patient reports that her headache has resolved. Denies, blurry vision, SOB, CP, NV. Reports feeling contractions approximately every 10 min. Recently received Cytotec.   Objective: BP 139/71   Pulse 78   Temp 98.7 F (37.1 C) (Oral)   Resp 18   Ht 5\' 1"  (1.549 m)   Wt 109.7 kg   LMP 10/04/2017   BMI 45.70 kg/m  Gen: NAD, well appearing, sitting with friend in room.  Dilation: Closed Effacement (%): 90 Station: -3 Presentation: Vertex(by US) Exam by:: Dr. Parke SimmersBland FHT: moderate variability with late deceleration noted.  Assessment and Plan: 37 y.o. G4P0030 6440w2d IOL due to chronic HTN, Type II DM, antiphospholipid syndrome.  Labor: Induction has begun with cytotec (bucchal)   -- MFM has been consulted  -- pain control: Undecided   Fetal Well-Being: EFW 6-7 pounds by Leopolds, EFW 2203 g at 34w3 by US. Cephalic by BSUS  -- Category II  - continuous fetal monitoring  -- GBS (+)- PCN at 1150    Type II DM - Well-controlled. A1C 4.9 3/19. Elevated BG 153 but after eating, covered with SSI.  -- SSI ACHS + CBG q4 hours -- will consider switch to glucose stabilizer once closer to active labor  Chronic Hypertension  Headache - BP was difficult to control in pregnancy, started on labetalol 200mg  BID. Had headache, will treat with Tylenol and Fioricet prn.  -- headache has resolved  -- continue to monitor symptoms   Kelli Miller MS4 [facility]  1:04 PM

## 2018-06-22 NOTE — H&P (Addendum)
OBSTETRIC ADMISSION HISTORY AND PHYSICAL  Kelli Miller is a 37 y.o. female G4P0030 with IUP at [redacted]w[redacted]d by LMP presenting for IOL for T2DM and chronic HTN. She reports +FMs, No LOF, no VB, no blurry vision, headaches or peripheral edema, and RUQ pain.  She plans on breastfeeding. She is not yet sure on which type of birth control she wants to use. She received her prenatal care at [facility].   Dating: By: LMP --->  Estimated Date of Delivery: 07/11/18  Sono:  @[redacted]w[redacted]d , CWD, normal anatomy, cephalic presentation, vertex lie, 239g, 46% EFW   Prenatal History/Complications:  Past Medical History: Past Medical History:  Diagnosis Date  . Antiphospholipid antibody syndrome (HCC) 2019  . Chronic hypertension in pregnancy 12/29/2017  . Diabetes (HCC)    Type 2  . Hypothyroidism   . PCOS (polycystic ovarian syndrome) 2015    Past Surgical History: Past Surgical History:  Procedure Laterality Date  . DILATION AND EVACUATION N/A 04/05/2014   Procedure: DILATATION AND EVACUATION with genetic studies;  Surgeon: Mitchel Honour, DO;  Location: [facility];  Service: Gynecology;  Laterality: N/A;    Obstetrical History: OB History    Gravida  4   Para  0   Term      Preterm      AB  3   Living  0     SAB  3   TAB      Ectopic      Multiple      Live Births              Social History: Social History   Socioeconomic History  . Marital status: Married    Spouse name: Not on file  . Number of children: Not on file  . Years of education: Not on file  . Highest education level: Not on file  Occupational History  . Not on file  Social Needs  . Financial resource strain: Not on file  . Food insecurity:    Worry: Not on file    Inability: Not on file  . Transportation needs:    Medical: Not on file    Non-medical: Not on file  Tobacco Use  . Smoking status: Former Smoker    Types: Cigarettes    Last attempt to quit: 10/03/2013    Years since quitting: 4.7  . Smokeless tobacco:  Never Used  Substance and Sexual Activity  . Alcohol use: No  . Drug use: No  . Sexual activity: Yes    Birth control/protection: Other-see comments    Comment: would like to talk to OBGYN  Lifestyle  . Physical activity:    Days per week: Not on file    Minutes per session: Not on file  . Stress: Not on file  Relationships  . Social connections:    Talks on phone: Not on file    Gets together: Not on file    Attends religious service: Not on file    Active member of club or organization: Not on file    Attends meetings of clubs or organizations: Not on file    Relationship status: Not on file  Other Topics Concern  . Not on file  Social History Narrative  . Not on file    Family History: Family History  Problem Relation Age of Onset  . Lung cancer Father   . Lung cancer Mother     Allergies: Allergies  Allergen Reactions  . Bactrim [Sulfamethoxazole-Trimethoprim] Rash    Medications Prior  to Admission  Medication Sig Dispense Refill Last Dose  . aspirin 81 MG chewable tablet Chew by mouth daily.   Past Week at Unknown time  . Cholecalciferol (VITAMIN D) 2000 UNITS CAPS Take 2,000 Units by mouth daily.   06/21/2018 at Unknown time  . diphenhydrAMINE (BENADRYL) 50 MG capsule Take 50 mg by mouth at bedtime as needed for sleep.   Past Week at Unknown time  . enoxaparin (LOVENOX) 40 MG/0.4ML injection Inject 0.4 mLs (40 mg total) into the skin daily. 30 Syringe 6 06/20/2018 at Unknown time  . insulin lispro (HUMALOG) 100 UNIT/ML injection 4 units pre breakfast and 6 units pre supper or as directed   06/20/2018 at Unknown time  . Iron, Ferrous Gluconate, 256 (28 Fe) MG TABS 1 tablet BID 60 tablet 3 06/21/2018 at Unknown time  . labetalol (NORMODYNE) 200 MG tablet TAKE 1 TABLET BY MOUTH TWICE DAILY 60 tablet 0 06/21/2018 at Unknown time  . levothyroxine (SYNTHROID, LEVOTHROID) 50 MCG tablet   3 06/21/2018 at Unknown time  . insulin NPH Human (HUMULIN N,NOVOLIN N) 100 UNIT/ML  injection Inject 0.4 mLs (40 Units total) into the skin at bedtime. (Patient taking differently: Inject 80 Units into the skin at bedtime. ) 10 mL 5 06/20/2018  . levothyroxine (SYNTHROID, LEVOTHROID) 100 MCG tablet Take 100 mcg by mouth daily before breakfast.   Taking  . Melatonin 3 MG CAPS Take 3 mg by mouth at bedtime as needed (sleep).   Taking  . metFORMIN (GLUCOPHAGE) 1000 MG tablet Take 1,000 mg by mouth 2 (two) times daily with a meal.   Taking  . Prenatal Vit-Fe Fumarate-FA (PRENATAL MULTIVITAMIN) TABS tablet Take 1 tablet by mouth daily at 12 noon.   Taking  . vitamin B-12 (CYANOCOBALAMIN) 100 MCG tablet Take 100 mcg by mouth daily.   Taking     Review of Systems   All systems reviewed and negative except as stated in HPI  Last menstrual period 10/04/2017, unknown if currently breastfeeding. General appearance: alert, cooperative, appears stated age and no distress Lungs: clear to auscultation bilaterally Heart: regular rate and rhythm Abdomen: soft, non-tender; bowel sounds normal Extremities: Assymmetry noted with LLE slightly more edematous than RLE. 1+ pitting edema on L. No erythema. Presentation: cephalic confirmed by U/S 8/28.  Fetal monitoringBaseline: 140 bpm, Variability: Good {> 6 bpm), Accelerations: Reactive and Decelerations: Absent Uterine activity: Irritability noted on strip; patient reports not feeling contractions  Dilation: Closed Effacement (%): 90 Station: -3 Exam by:: Clare Charon RNC   Prenatal labs: ABO, Rh: AB/RH(D) POSITIVE/-- (02/21 1517) Antibody: NO ANTIBODIES DETECTED (02/21 1517) Rubella: 4.39 (02/21 1517) RPR: NON-REACTIVE (07/02 1421)  HBsAg: NON-REACTIVE (02/21 1517)  HIV: NON-REACTIVE (07/02 1421)  GBS: Positive (08/19 0000)  Genetic screening  NIPS indicates high risk for T13, T8; further testing declined. Anatomy US Normal  Prenatal Transfer Tool  Maternal Diabetes: Yes:  Diabetes Type:  Pre-pregnancy Genetic Screening:  Abnormal:  Results: Other: High risk for T13 and T18 due to low fetal fraction  Maternal Ultrasounds/Referrals: Normal Fetal Ultrasounds or other Referrals:  Referred to Materal Fetal Medicine  Maternal Substance Abuse:  No Significant Maternal Medications:  Meds include: Other: Insulin Lispro and NPH, Metformin, Synthroid, Aspirin, Vit D, Labetolol, Iron, Lovenox, Prenatal Significant Maternal Lab Results: Lab values include: Group B Strep positive  Results for orders placed or performed during the hospital encounter of 06/22/18 (from the past 24 hour(s))  CBC   Collection Time: 06/22/18  1:01 AM  Result  Value Ref Range   WBC 12.6 (H) 4.0 - 10.5 K/uL   RBC 3.80 (L) 3.87 - 5.11 MIL/uL   Hemoglobin 12.3 12.0 - 15.0 g/dL   HCT 16.135.6 (L) 09.636.0 - 04.546.0 %   MCV 93.7 78.0 - 100.0 fL   MCH 32.4 26.0 - 34.0 pg   MCHC 34.6 30.0 - 36.0 g/dL   RDW 40.914.1 81.111.5 - 91.415.5 %   Platelets 236 150 - 400 K/uL    Patient Active Problem List   Diagnosis Date Noted  . GBS (group B Streptococcus carrier), +RV culture, currently pregnant 06/17/2018  . Trichomoniasis 05/30/2018  . PCOS (polycystic ovarian syndrome) 01/13/2018  . GERD (gastroesophageal reflux disease) 01/13/2018  . Psoriasis 01/13/2018  . Antiphospholipid antibody syndrome (HCC) 01/13/2018  . Abnormal antenatal test 01/07/2018  . Obesity in pregnancy 12/29/2017  . Chronic hypertension in pregnancy 12/29/2017  . Advanced maternal age in multigravida 12/27/2017  . Supervision of high risk pregnancy, antepartum 12/16/2017  . Pre-existing type 2 diabetes mellitus in pregnancy in second trimester 12/16/2017  . Hypothyroidism affecting pregnancy, antepartum 12/16/2017  . Recurrent pregnancy loss 01/16/2016  . Elevated TSH 01/16/2016  . Hypomenorrhea/oligomenorrhea 01/16/2016    Assessment/Plan:  Pleas Kochmanda Treto is a 37 y.o. G4P0030 at 6114w2d here for IOL due to T2DM and chronic HTN.   #Labor: Undocumented reason behind induction at 37 weeks.  Current problems including chronic HTN and diabetes would indicate induction at 39 wks. Reaching out to MFM to confirm rationale before starting induction process.  #Pain: Epidural desired; NB: patient on Lovenox. #FWB: Cat 1 tracing #ID:  GBS pos; trich was positive on 05/10/18 but test for cure wasn't done; will test for cure today 8/28. #MOF: Breast #MOC: Patient is not sure right now.  #Circ:  N/A (female)  Raynelle HighlandJuliana W Stone, Medical Student  06/22/2018, 2:00 AM    I personally saw and evaluated the patient, performing the key elements of the service. I developed and verified the management plan that is described in the medical student's note, and I agree with the content with my edits above.   General: well nourished, well developed, in no acute distress with non-toxic appearance CV: regular rate and rhythm without murmurs, rubs, or gallops Lungs: CTA bilaterally with normal work of breathing, no wheezes/rales/rhonchi Abdomen: soft, non-tender,no masses or organomegaly palpable,  Skin: warm, dry, no rashes or lesions, cap refill < 2 seconds Extremities: warm and well perfused, normal tone MSK: Full ROM, strength intact Neuro: Alert and oriented, speech normal  Assessment: Patient presenting for labor induction.  IT is as yet undetermined if she should be induced at 37 as she was scheduled or hold for 39wks.  She appears stable with no pre-eclampsia and DM is controlled on insulin.  GBS pos Trich pos with no TOC on record FWB Cat 1 on ext monitor  Plan: Coordinate with MFM abotu induction timing Penicillin if inducing TOC for trich Continue standard L/D monitoring until course determined.  Dr. Marthenia RollingScott Bland, PGY2  Family Medicine 06/22/2018 9:35 AM   I spoke with and examined patient and agree with resident/PA/SNM's note and plan of care.  POC for tonight (8/28) discussed w/ pt by Dr. Macon LargeAnyanwu, plan for BPP in AM w/ consult from MFM to determine induction  timing. Cheral MarkerKimberly R. Taeveon Keesling, CNM, [facility]NP-BC 06/23/2018 10:53 AM

## 2018-06-22 NOTE — Progress Notes (Signed)
OB/GYN Faculty Practice: Labor Progress Note  Subjective: Doing well. Has been here since midnight for induction. Delay to discuss plan with outpatient provider since no clear indication for induction at 37w2. MFM note indicates plan to induce between 37-39 weeks for combination of factors including chronic hypertension, Type II DM, antiphospholipid syndrome. Has a headache but denies vision changes, abdominal pain.   Objective: BP (!) 123/59   Pulse 76   Temp 98.7 F (37.1 C) (Oral)   Resp 16   Ht 5\' 1"  (1.549 m)   Wt 109.7 kg   LMP 10/04/2017   BMI 45.70 kg/m  Gen: well-appearing, friend Birdena CrandallRainey in room with her Dilation: Closed Effacement (%): 90 Station: -3 Presentation: Vertex(by US) Exam by:: Dr. Parke SimmersBland  Assessment and Plan: 37 y.o. G4P0030 3331w2d here for IOL for chronic hypertension, Type II DM.   Induction of Labor cHTN, Type II DM, Antiphospholipid Syndrome - Induction to start now with cytotec after consulted with MFM provider.  -- cytotec x 1 (bucchal)  -- pain control: undecided   Fetal Well-Being: EFW 6-7 lbs by Leopolds, EFW 2203g (41%) at 34w3 by US. Cephalic by BSUS.  -- Category I - continuous fetal monitoring  -- GBS (+) - PCN (1150)  Type II DM - Well-controlled. A1C 4.9 3/19. Elevated BG 153 but after eating, covered with SSI.  -- SSI ACHS + CBG q4 hours -- will consider switch to glucose stabilizer once closer to active labor  Chronic Hypertension  Headache - BP was difficult to control in pregnancy, started on labetalol 200mg  BID. Has headache, will treat with Tylenol and Fioricet prn.  -- continue to monitor symptoms   Laurel S. Earlene PlaterWallace, DO OB/GYN Fellow, Faculty Practice  11:53 AM

## 2018-06-22 NOTE — Anesthesia Pain Management Evaluation Note (Signed)
  CRNA Pain Management Visit Note  Patient: Kelli Miller, 37 y.o., female  "Hello I am a [facility] of the [facility]. We have an anesthesia team available at all times to provide care throughout the hospital, including epidural management and anesthesia for C-section. I don't know your plan for the delivery whether it a natural birth, water birth, IV sedation, nitrous supplementation, doula or epidural, but we want to meet your pain goals."   1.Was your pain managed to your expectations on prior hospitalizations?   No prior hospitalizations  2.What is your expectation for pain management during this hospitalization?     Epidural  3.How can we help you reach that goal? Epidural when desired  Record the patient's initial score and the patient's pain goal.   Pain: 0  Pain Goal: 5 [facility] wants you to be able to say your pain was always managed very well.  Armando Bukhari 06/22/2018

## 2018-06-22 NOTE — Plan of Care (Signed)
Pt educated on induction process.  POC discussed with providers and patient and husband.  Induction pending based on MFM recommendation.

## 2018-06-22 NOTE — Consult Note (Signed)
Maternal-Fetal Medicine  I had the pleasure of seeing Kelli Miller who is admitted at the birthing suite for induction of labor.  Consultation was requested to address the timing of delivery.  She is G4, P0 at 1637W 2D gestation with the following problems:  -Chronic hypertension: Well controlled on labetalol -Pre-gestational diabetes on insulin.  Patient takes insulin NPH 80 units at night and Humalog 4/0/8 units with meals.  Her blood glucose levels are within normal range. -Antiphospholipid syndrome (on Lovenox 40 mg daily). -Hypothyroidism: Well-controlled on Synthroid.  Obstetric history significant for 3 early spontaneous miscarriages.  Following detection of antiphospholipid antibodies she was taking Lovenox in this pregnancy. GYN history: No history of abnormal Pap smears or cervical surgeries. Medications: Prenatal vitamins, labetalol, insulin, Synthroid. Allergies: Bactrim (rashes). Social history: Denies tobacco drug or alcohol use.  She is been married 3 years and her husband is in good health.  He is an African-American.  Her prenatal care has been uneventful so far.  Weekly antenatal testings have been reassuring. Patient does not have symptoms of severe headache or spots before eyes or right upper quadrant pain or decreased fetal movements or vaginal bleeding.  She reports mild headaches off and on. NST performed so far has been reassuring. Her blood pressures are within normal range. Labs including platelets and liver enzymes are normal.  Patient is upset that after being admitted for induction of labor recommendation is made to reconsider the decision.  I counseled the patient on the timing of delivery taking into account her multiple medical conditions.  I informed her that with well-controlled diabetes and hypertension the likelihood of fetal risk is very small. ACOG recommends delivery between 37 0/7 and 39 6/[redacted] weeks gestation in patients with chronic hypertension well controlled  on medications (ACOG Committee Opinion, No. 764, Feb 2019). Patient may choose to have weekly antenatal testing till 39 weeks.  I explained that delivery at 37 weeks as opposed to [redacted] weeks gestation is associated with the slightly increased neonatal complications including respiratory distress syndrome and readmissions to the NICU.  Multiple maternal medical conditions even if stable slightly increases fetal risks.  Duction of labor with an unfavorable cervix increases the risk of cesarean section and its associated morbidities.  I asked to reconsider these facts and discussed the following options: 1) Discharge home and return at 39 weeks for induction of labor. 2) Continue with induction of labor now. 3) She may also consider delivery at 38 weeks will allow time for cervix to be more favorable at the time of induction.  Patient is very firm in her decision to have induction of labor now.  She understands a slight increased risk of complications in the newborn and also of increased possibility of having a cesarean section.  Her cousin was present at her request during consultation.  Recommendations: -Proceed with induction of labor. -Lovenox may be discontinued after delivery if patient is able to ambulate.

## 2018-06-22 NOTE — Progress Notes (Signed)
Saw and evaluated patient this morning regarding timing of this induction. Upon review of chart 7/11 mfm note advises iol 37-39 wks, presumably for presence of multiple comorbidities (specifically pre-gestational diabetes and chronic hypertension), which, depending on level of control, can indicate early term induction. Patient's induction previously scheduled for today, at 37+2. Patient seen in our MAU last week for HA that resolved. Eval did not suggest preeclampsia, but given new onset of headaches appears that provider also thought prudent IOL in 37th week and so affirmed IOL today. Current bp wnl. No signs severe disease on labs. Reactive NST, category 1 tracing. Does complain of mild headache today, no sob or upper abdominal pain. Discussed case w/ on-call MFM who reviewed comorbidities, agreed iol could wait until 39+0. ACOG recs are IOl 37+0-39+0 for chtn pts on meds. Reviewed w/ patient risks/benefits of early term induction vs expectant mgmt until 39+0, pt very desirous of iol today. I agreed to proceed. Will give tylenol for headache; should symptoms persist would need to consider repeat hellp labs and Mg for seizure ppx.

## 2018-06-22 NOTE — Progress Notes (Signed)
OB/GYN Faculty Practice: Labor Progress Note  Subjective: Doing well, comfortable, occasionally feeling some cramping.   Objective: BP (!) 145/72 (BP Location: Right Arm)   Pulse 73   Temp 98.4 F (36.9 C) (Oral)   Resp 18   Ht 5\' 1"  (1.549 m)   Wt 109.7 kg   LMP 10/04/2017   BMI 45.70 kg/m  Gen: comfortable appearing Dilation: Closed Effacement (%): Thick Cervical Position: Posterior Station: -3 Presentation: Vertex Exam by:: Dr Earlene PlaterWallace  Assessment and Plan: 37 y.o. G4P0030 4660w2d here for IOL for chronic hypertension, Type II DM.   Induction of Labor cHTN, Type II DM, Antiphospholipid Syndrome - See MFM consult note for indications.  -- cytotec x 2 (bucchal)  -- pain control: undecided   Fetal Well-Being: EFW 6-7 lbs by Leopolds, EFW 2203g (41%) at 34w3 by US. Cephalic by BSUS.  -- Category I - continuous fetal monitoring  -- GBS (+) - PCN (1150, 1605)  Type II DM - Well-controlled. A1C 4.9 3/19. Elevated BG 153 but after eating, covered with SSI. Most recent 91.   -- SSI ACHS + CBG q4 hours -- will consider switch to glucose stabilizer once closer to active labor  Chronic Hypertension  Headache - Normal to moderate range pressures. BP was difficult to control in pregnancy, started on labetalol 200mg  BID. Headache resolved.  -- continue to monitor symptoms   Aeron Donaghey S. Earlene PlaterWallace, DO OB/GYN Fellow, Faculty Practice  4:39 PM

## 2018-06-23 ENCOUNTER — Encounter (HOSPITAL_COMMUNITY)
Admission: RE | Disposition: A | Discharge status: Home / Self Care | Payer: Self-pay | Source: Home / Self Care | Attending: Obstetrics & Gynecology

## 2018-06-23 ENCOUNTER — Encounter (HOSPITAL_COMMUNITY): Payer: Self-pay

## 2018-06-23 ENCOUNTER — Inpatient Hospital Stay (HOSPITAL_COMMUNITY): Payer: PRIVATE HEALTH INSURANCE | Admitting: Anesthesiology

## 2018-06-23 DIAGNOSIS — O114 Pre-existing hypertension with pre-eclampsia, complicating childbirth: Secondary | ICD-10-CM

## 2018-06-23 DIAGNOSIS — E119 Type 2 diabetes mellitus without complications: Secondary | ICD-10-CM

## 2018-06-23 DIAGNOSIS — Z3A37 37 weeks gestation of pregnancy: Secondary | ICD-10-CM

## 2018-06-23 DIAGNOSIS — O2412 Pre-existing diabetes mellitus, type 2, in childbirth: Secondary | ICD-10-CM

## 2018-06-23 LAB — COMPREHENSIVE METABOLIC PANEL
ALBUMIN: 2.9 g/dL — AB (ref 3.5–5.0)
ALT: 17 U/L (ref 0–44)
ANION GAP: 10 (ref 5–15)
AST: 16 U/L (ref 15–41)
Alkaline Phosphatase: 92 U/L (ref 38–126)
BUN: 6 mg/dL (ref 6–20)
CALCIUM: 8.5 mg/dL — AB (ref 8.9–10.3)
CO2: 20 mmol/L — ABNORMAL LOW (ref 22–32)
CREATININE: 0.51 mg/dL (ref 0.44–1.00)
Chloride: 105 mmol/L (ref 98–111)
GFR calc non Af Amer: >60 mL/min (ref >60)
Glucose, Bld: 143 mg/dL — ABNORMAL HIGH (ref 70–99)
Potassium: 3.8 mmol/L (ref 3.5–5.1)
Sodium: 135 mmol/L (ref 135–145)
Total Bilirubin: 0.5 mg/dL (ref 0.3–1.2)
Total Protein: 6.7 g/dL (ref 6.5–8.1)

## 2018-06-23 LAB — CBC
HCT: 37.5 % (ref 36.0–46.0)
Hemoglobin: 13.1 g/dL (ref 12.0–15.0)
MCH: 32.3 pg (ref 26.0–34.0)
MCHC: 34.9 g/dL (ref 30.0–36.0)
MCV: 92.6 fL (ref 78.0–100.0)
PLATELETS: 214 10*3/uL (ref 150–400)
RBC: 4.05 MIL/uL (ref 3.87–5.11)
RDW: 13.8 % (ref 11.5–15.5)
WBC: 12.2 10*3/uL — AB (ref 4.0–10.5)

## 2018-06-23 LAB — GLUCOSE, CAPILLARY
GLUCOSE-CAPILLARY: 129 mg/dL — AB (ref 70–99)
GLUCOSE-CAPILLARY: 101 mg/dL — AB (ref 70–99)
GLUCOSE-CAPILLARY: 83 mg/dL (ref 70–99)
Glucose-Capillary: 176 mg/dL — ABNORMAL HIGH (ref 70–99)
Glucose-Capillary: 100 mg/dL — ABNORMAL HIGH (ref 70–99)

## 2018-06-23 SURGERY — Surgical Case
Anesthesia: Epidural

## 2018-06-23 MED ORDER — PRENATAL MULTIVITAMIN CH
1.0000 | ORAL_TABLET | Freq: Every day | ORAL | Status: DC
  Administered 2018-06-24 – 2018-06-25 (×2): 1 via ORAL
  Filled 2018-06-23 (×2): qty 1

## 2018-06-23 MED ORDER — METOCLOPRAMIDE HCL 5 MG/ML IJ SOLN
INTRAMUSCULAR | Status: DC | PRN
  Administered 2018-06-23: 10 mg via INTRAVENOUS

## 2018-06-23 MED ORDER — LIDOCAINE-EPINEPHRINE (PF) 2 %-1:200000 IJ SOLN
INTRAMUSCULAR | Status: AC
  Filled 2018-06-23: qty 20

## 2018-06-23 MED ORDER — PHENYLEPHRINE 40 MCG/ML (10ML) SYRINGE FOR IV PUSH (FOR BLOOD PRESSURE SUPPORT)
PREFILLED_SYRINGE | INTRAVENOUS | Status: AC
  Filled 2018-06-23: qty 10

## 2018-06-23 MED ORDER — LACTATED RINGERS IV SOLN
INTRAVENOUS | Status: DC
  Administered 2018-06-24: 05:00:00 via INTRAVENOUS

## 2018-06-23 MED ORDER — LEVOTHYROXINE SODIUM 50 MCG PO TABS
50.0000 ug | ORAL_TABLET | Freq: Every day | ORAL | Status: DC
  Administered 2018-06-24 – 2018-06-25 (×2): 50 ug via ORAL
  Filled 2018-06-23 (×3): qty 1

## 2018-06-23 MED ORDER — MEASLES, MUMPS & RUBELLA VAC ~~LOC~~ INJ: 0.5000 mL | INJECTION | Freq: Once | SUBCUTANEOUS | Status: DC

## 2018-06-23 MED ORDER — MAGNESIUM SULFATE 40 G IN LACTATED RINGERS - SIMPLE
2.0000 g/h | INTRAVENOUS | Status: AC
  Administered 2018-06-24: 2 g/h via INTRAVENOUS
  Filled 2018-06-23 (×2): qty 500

## 2018-06-23 MED ORDER — TETANUS-DIPHTH-ACELL PERTUSSIS 5-2.5-18.5 LF-MCG/0.5 IM SUSP: 0.5000 mL | Freq: Once | INTRAMUSCULAR | Status: DC

## 2018-06-23 MED ORDER — MORPHINE SULFATE (PF) 0.5 MG/ML IJ SOLN
INTRAMUSCULAR | Status: AC
  Filled 2018-06-23: qty 10

## 2018-06-23 MED ORDER — MEPERIDINE HCL 25 MG/ML IJ SOLN: 6.2500 mg | INTRAMUSCULAR | Status: DC | PRN

## 2018-06-23 MED ORDER — LACTATED RINGERS IV SOLN
500.0000 mL | Freq: Once | INTRAVENOUS | Status: AC
  Administered 2018-06-23: 500 mL via INTRAVENOUS

## 2018-06-23 MED ORDER — FENTANYL 2.5 MCG/ML BUPIVACAINE 1/10 % EPIDURAL INFUSION (WH - ANES)
INTRAMUSCULAR | Status: DC | PRN
  Administered 2018-06-23: 14 mL/h via EPIDURAL

## 2018-06-23 MED ORDER — HYDRALAZINE HCL 20 MG/ML IJ SOLN: 5.0000 mg | INTRAMUSCULAR | Status: DC | PRN

## 2018-06-23 MED ORDER — OXYTOCIN 40 UNITS IN LACTATED RINGERS INFUSION - SIMPLE MED: 2.5000 [IU]/h | INTRAVENOUS | Status: AC

## 2018-06-23 MED ORDER — ONDANSETRON HCL 4 MG/2ML IJ SOLN
INTRAMUSCULAR | Status: AC
  Filled 2018-06-23: qty 2

## 2018-06-23 MED ORDER — TERBUTALINE SULFATE 1 MG/ML IJ SOLN: 0.2500 mg | Freq: Once | INTRAMUSCULAR | Status: DC | PRN

## 2018-06-23 MED ORDER — HYDROCODONE-ACETAMINOPHEN 7.5-325 MG PO TABS: 1.0000 | ORAL_TABLET | Freq: Once | ORAL | Status: DC | PRN

## 2018-06-23 MED ORDER — MAGNESIUM SULFATE 40 G IN LACTATED RINGERS - SIMPLE
2.0000 g/h | INTRAVENOUS | Status: DC
  Administered 2018-06-23 (×2): 2 g/h via INTRAVENOUS
  Filled 2018-06-23: qty 500

## 2018-06-23 MED ORDER — LACTATED RINGERS IV SOLN
INTRAVENOUS | Status: DC | PRN
  Administered 2018-06-23: 18:00:00 via INTRAVENOUS

## 2018-06-23 MED ORDER — SODIUM CHLORIDE 0.9 % IV SOLN
500.0000 mg | INTRAVENOUS | Status: AC
  Administered 2018-06-23: 500 mg via INTRAVENOUS
  Filled 2018-06-23: qty 500

## 2018-06-23 MED ORDER — ENOXAPARIN SODIUM 40 MG/0.4ML ~~LOC~~ SOLN
40.0000 mg | SUBCUTANEOUS | Status: DC
  Administered 2018-06-24 – 2018-06-25 (×2): 40 mg via SUBCUTANEOUS
  Filled 2018-06-23 (×2): qty 0.4

## 2018-06-23 MED ORDER — ACETAMINOPHEN 325 MG PO TABS: 650.0000 mg | ORAL_TABLET | ORAL | Status: DC | PRN

## 2018-06-23 MED ORDER — SODIUM CHLORIDE 0.9 % IV SOLN
2.0000 g | INTRAVENOUS | Status: AC
  Administered 2018-06-23: 2 g via INTRAVENOUS
  Filled 2018-06-23: qty 2

## 2018-06-23 MED ORDER — EPHEDRINE 5 MG/ML INJ: 10.0000 mg | INTRAVENOUS | Status: DC | PRN

## 2018-06-23 MED ORDER — ZOLPIDEM TARTRATE 5 MG PO TABS: 5.0000 mg | ORAL_TABLET | Freq: Every evening | ORAL | Status: DC | PRN

## 2018-06-23 MED ORDER — PHENYLEPHRINE HCL 10 MG/ML IJ SOLN
INTRAMUSCULAR | Status: DC | PRN
  Administered 2018-06-23 (×2): 80 ug via INTRAVENOUS
  Administered 2018-06-23: 40 ug via INTRAVENOUS

## 2018-06-23 MED ORDER — LACTATED RINGERS IV SOLN
INTRAVENOUS | Status: DC | PRN
  Administered 2018-06-23: 17:00:00 via INTRAVENOUS

## 2018-06-23 MED ORDER — OXYTOCIN 10 UNIT/ML IJ SOLN
INTRAMUSCULAR | Status: AC
  Filled 2018-06-23: qty 4

## 2018-06-23 MED ORDER — DEXAMETHASONE SODIUM PHOSPHATE 4 MG/ML IJ SOLN
INTRAMUSCULAR | Status: AC
  Filled 2018-06-23: qty 1

## 2018-06-23 MED ORDER — WITCH HAZEL-GLYCERIN EX PADS: 1.0000 "application " | MEDICATED_PAD | CUTANEOUS | Status: DC | PRN

## 2018-06-23 MED ORDER — FENTANYL 2.5 MCG/ML BUPIVACAINE 1/10 % EPIDURAL INFUSION (WH - ANES)
14.0000 mL/h | INTRAMUSCULAR | Status: DC | PRN
  Administered 2018-06-23: 14 mL/h via EPIDURAL
  Filled 2018-06-23: qty 100

## 2018-06-23 MED ORDER — SODIUM BICARBONATE 8.4 % IV SOLN
INTRAVENOUS | Status: DC | PRN
  Administered 2018-06-23 (×2): 5 mL via EPIDURAL

## 2018-06-23 MED ORDER — OXYCODONE-ACETAMINOPHEN 5-325 MG PO TABS: 2.0000 | ORAL_TABLET | ORAL | Status: DC | PRN

## 2018-06-23 MED ORDER — FUROSEMIDE 20 MG PO TABS
20.0000 mg | ORAL_TABLET | Freq: Every day | ORAL | Status: DC
  Administered 2018-06-24 – 2018-06-25 (×2): 20 mg via ORAL
  Filled 2018-06-23 (×3): qty 1

## 2018-06-23 MED ORDER — HYDROMORPHONE HCL 1 MG/ML IJ SOLN: 0.2500 mg | INTRAMUSCULAR | Status: DC | PRN

## 2018-06-23 MED ORDER — DIPHENHYDRAMINE HCL 50 MG/ML IJ SOLN: 12.5000 mg | INTRAMUSCULAR | Status: DC | PRN

## 2018-06-23 MED ORDER — COCONUT OIL OIL: 1.0000 "application " | TOPICAL_OIL | Status: DC | PRN

## 2018-06-23 MED ORDER — INSULIN ASPART 100 UNIT/ML ~~LOC~~ SOLN
0.0000 [IU] | SUBCUTANEOUS | Status: DC | PRN
  Administered 2018-06-23: 1 [IU] via SUBCUTANEOUS
  Administered 2018-06-23: 3 [IU] via SUBCUTANEOUS
  Administered 2018-06-23: 2 [IU] via SUBCUTANEOUS
  Filled 2018-06-23 (×3): qty 0.14

## 2018-06-23 MED ORDER — OXYTOCIN 40 UNITS IN LACTATED RINGERS INFUSION - SIMPLE MED
1.0000 m[IU]/min | INTRAVENOUS | Status: DC
  Administered 2018-06-23: 1 m[IU]/min via INTRAVENOUS

## 2018-06-23 MED ORDER — ONDANSETRON HCL 4 MG/2ML IJ SOLN
INTRAMUSCULAR | Status: DC | PRN
  Administered 2018-06-23: 4 mg via INTRAVENOUS

## 2018-06-23 MED ORDER — PHENYLEPHRINE 40 MCG/ML (10ML) SYRINGE FOR IV PUSH (FOR BLOOD PRESSURE SUPPORT): 80.0000 ug | PREFILLED_SYRINGE | INTRAVENOUS | Status: DC | PRN

## 2018-06-23 MED ORDER — IBUPROFEN 600 MG PO TABS
600.0000 mg | ORAL_TABLET | Freq: Four times a day (QID) | ORAL | Status: DC
  Administered 2018-06-24 – 2018-06-25 (×8): 600 mg via ORAL
  Filled 2018-06-23 (×8): qty 1

## 2018-06-23 MED ORDER — MORPHINE SULFATE (PF) 0.5 MG/ML IJ SOLN
INTRAMUSCULAR | Status: DC | PRN
  Administered 2018-06-23: 3 mg via EPIDURAL

## 2018-06-23 MED ORDER — FENTANYL 2.5 MCG/ML BUPIVACAINE 1/10 % EPIDURAL INFUSION (WH - ANES)
INTRAMUSCULAR | Status: AC
  Filled 2018-06-23: qty 100

## 2018-06-23 MED ORDER — MENTHOL 3 MG MT LOZG: 1.0000 | LOZENGE | OROMUCOSAL | Status: DC | PRN

## 2018-06-23 MED ORDER — LABETALOL HCL 5 MG/ML IV SOLN: 20.0000 mg | INTRAVENOUS | Status: DC | PRN

## 2018-06-23 MED ORDER — MAGNESIUM SULFATE BOLUS VIA INFUSION
4.0000 g | Freq: Once | INTRAVENOUS | Status: AC
  Administered 2018-06-23: 4 g via INTRAVENOUS
  Filled 2018-06-23: qty 500

## 2018-06-23 MED ORDER — HYDRALAZINE HCL 20 MG/ML IJ SOLN: 10.0000 mg | INTRAMUSCULAR | Status: DC | PRN

## 2018-06-23 MED ORDER — SENNOSIDES-DOCUSATE SODIUM 8.6-50 MG PO TABS
2.0000 | ORAL_TABLET | ORAL | Status: DC
  Administered 2018-06-24 (×2): 2 via ORAL
  Filled 2018-06-23 (×2): qty 2

## 2018-06-23 MED ORDER — ACETAMINOPHEN 10 MG/ML IV SOLN: 1000.0000 mg | Freq: Once | INTRAVENOUS | Status: DC | PRN

## 2018-06-23 MED ORDER — OXYTOCIN 10 UNIT/ML IJ SOLN
INTRAVENOUS | Status: DC | PRN
  Administered 2018-06-23: 40 [IU] via INTRAVENOUS

## 2018-06-23 MED ORDER — SIMETHICONE 80 MG PO CHEW: 80.0000 mg | CHEWABLE_TABLET | ORAL | Status: DC | PRN

## 2018-06-23 MED ORDER — METOCLOPRAMIDE HCL 5 MG/ML IJ SOLN
INTRAMUSCULAR | Status: AC
  Filled 2018-06-23: qty 2

## 2018-06-23 MED ORDER — MAGNESIUM HYDROXIDE 400 MG/5ML PO SUSP: 30.0000 mL | ORAL | Status: DC | PRN

## 2018-06-23 MED ORDER — SIMETHICONE 80 MG PO CHEW
80.0000 mg | CHEWABLE_TABLET | ORAL | Status: DC
  Administered 2018-06-24 (×2): 80 mg via ORAL
  Filled 2018-06-23 (×2): qty 1

## 2018-06-23 MED ORDER — PROMETHAZINE HCL 25 MG/ML IJ SOLN: 6.2500 mg | INTRAMUSCULAR | Status: DC | PRN

## 2018-06-23 MED ORDER — METFORMIN HCL 500 MG PO TABS
1000.0000 mg | ORAL_TABLET | Freq: Two times a day (BID) | ORAL | Status: DC
  Administered 2018-06-24 – 2018-06-25 (×3): 1000 mg via ORAL
  Filled 2018-06-23 (×5): qty 2

## 2018-06-23 MED ORDER — LIDOCAINE HCL (PF) 1 % IJ SOLN
INTRAMUSCULAR | Status: DC | PRN
  Administered 2018-06-23: 8 mL via EPIDURAL
  Administered 2018-06-23: 4 mL via EPIDURAL

## 2018-06-23 MED ORDER — OXYCODONE-ACETAMINOPHEN 5-325 MG PO TABS: 1.0000 | ORAL_TABLET | ORAL | Status: DC | PRN

## 2018-06-23 MED ORDER — DIPHENHYDRAMINE HCL 25 MG PO CAPS: 25.0000 mg | ORAL_CAPSULE | Freq: Four times a day (QID) | ORAL | Status: DC | PRN

## 2018-06-23 MED ORDER — FERROUS SULFATE 325 (65 FE) MG PO TABS
325.0000 mg | ORAL_TABLET | Freq: Two times a day (BID) | ORAL | Status: DC
  Administered 2018-06-24 – 2018-06-25 (×4): 325 mg via ORAL
  Filled 2018-06-23 (×4): qty 1

## 2018-06-23 MED ORDER — LABETALOL HCL 5 MG/ML IV SOLN: 40.0000 mg | INTRAVENOUS | Status: DC | PRN

## 2018-06-23 MED ORDER — DIBUCAINE 1 % RE OINT: 1.0000 "application " | TOPICAL_OINTMENT | RECTAL | Status: DC | PRN

## 2018-06-23 MED FILL — Sodium Chloride IV Soln 0.9%: INTRAVENOUS | Qty: 100 | Status: AC

## 2018-06-23 MED FILL — Penicillin G Potassium For Inj 5000000 Unit: INTRAMUSCULAR | Qty: 3 | Status: AC

## 2018-06-23 SURGICAL SUPPLY — 35 items
BENZOIN TINCTURE PRP APPL 2/3 (GAUZE/BANDAGES/DRESSINGS) ×2 IMPLANT
CHLORAPREP W/TINT 26ML (MISCELLANEOUS) ×2 IMPLANT
CLAMP CORD UMBIL (MISCELLANEOUS) IMPLANT
CLOSURE STERI STRIP 1/2 X4 (GAUZE/BANDAGES/DRESSINGS) ×2 IMPLANT
CLOTH BEACON ORANGE TIMEOUT ST (SAFETY) ×2 IMPLANT
DRESSING PREVENA PLUS CUSTOM (GAUZE/BANDAGES/DRESSINGS) ×1 IMPLANT
DRSG OPSITE POSTOP 4X10 (GAUZE/BANDAGES/DRESSINGS) ×2 IMPLANT
DRSG PREVENA PLUS CUSTOM (GAUZE/BANDAGES/DRESSINGS) ×2
ELECT REM PT RETURN 9FT ADLT (ELECTROSURGICAL) ×2
ELECTRODE REM PT RTRN 9FT ADLT (ELECTROSURGICAL) ×1 IMPLANT
EXTRACTOR VACUUM M CUP 4 TUBE (SUCTIONS) IMPLANT
GLOVE BIOGEL PI IND STRL 7.0 (GLOVE) ×3 IMPLANT
GLOVE BIOGEL PI INDICATOR 7.0 (GLOVE) ×3
GLOVE ECLIPSE 7.0 STRL STRAW (GLOVE) ×2 IMPLANT
GOWN STRL REUS W/TWL LRG LVL3 (GOWN DISPOSABLE) ×4 IMPLANT
KIT ABG SYR 3ML LUER SLIP (SYRINGE) IMPLANT
KIT PREVENA INCISION MGT20CM45 (CANNISTER) ×2 IMPLANT
NEEDLE HYPO 22GX1.5 SAFETY (NEEDLE) ×2 IMPLANT
NEEDLE HYPO 25X5/8 SAFETYGLIDE (NEEDLE) ×2 IMPLANT
NS IRRIG 1000ML POUR BTL (IV SOLUTION) ×2 IMPLANT
PACK C SECTION WH (CUSTOM PROCEDURE TRAY) ×2 IMPLANT
PAD ABD 7.5X8 STRL (GAUZE/BANDAGES/DRESSINGS) ×2 IMPLANT
PAD OB MATERNITY 4.3X12.25 (PERSONAL CARE ITEMS) ×2 IMPLANT
PENCIL SMOKE EVAC W/HOLSTER (ELECTROSURGICAL) ×2 IMPLANT
RETRACTOR TRAXI PANNICULUS (MISCELLANEOUS) ×1 IMPLANT
RTRCTR C-SECT PINK 25CM LRG (MISCELLANEOUS) IMPLANT
SUT PDS AB 0 CTX 36 PDP370T (SUTURE) IMPLANT
SUT PLAIN 2 0 XLH (SUTURE) ×2 IMPLANT
SUT VIC AB 0 CTX 36 (SUTURE) ×2
SUT VIC AB 0 CTX36XBRD ANBCTRL (SUTURE) ×2 IMPLANT
SUT VIC AB 4-0 KS 27 (SUTURE) ×2 IMPLANT
SYR CONTROL 10ML LL (SYRINGE) ×2 IMPLANT
TOWEL OR 17X24 6PK STRL BLUE (TOWEL DISPOSABLE) ×2 IMPLANT
TRAXI PANNICULUS RETRACTOR (MISCELLANEOUS) ×1
TRAY FOLEY W/BAG SLVR 14FR LF (SET/KITS/TRAYS/PACK) ×2 IMPLANT

## 2018-06-23 NOTE — Op Note (Signed)
[name] PROCEDURE DATE: 06/23/2018  PREOPERATIVE DIAGNOSES: Intrauterine pregnancy at [redacted]w[redacted]d weeks gestation; failed induction for chornic hypertension with superimposed severe preeclampsia; non-reassuring fetal status; Type II DM  POSTOPERATIVE DIAGNOSES: The same  PROCEDURE: Primary Low Transverse Cesarean Section  SURGEON:  Dr. Jaynie Collins  ASSISTANT: Webb Silversmith, RNFA and Derrel Nip MS IV  ANESTHESIOLOGY TEAM: Anesthesiologist: Phillips Grout, MD; Leilani Able, MD; Trevor Iha, MD CRNA: Graciela Husbands, CRNA  INDICATIONS: Kelli Miller is a 37 y.o. G4P0030 at [redacted]w[redacted]d here for cesarean section secondary to the indications listed under preoperative diagnoses; please see preoperative note for further details.  The risks of cesarean section were discussed with the patient including but were not limited to: bleeding which may require transfusion or reoperation; infection which may require antibiotics; injury to bowel, bladder, ureters or other surrounding organs; injury to the fetus; need for additional procedures including hysterectomy in the event of a life-threatening hemorrhage; placental abnormalities wth subsequent pregnancies, incisional problems, thromboembolic phenomenon and other postoperative/anesthesia complications.   The patient concurred with the proposed plan, giving informed written consent for the procedure.    FINDINGS:  Viable female infant in cephalic presentation.  Apgars 9 and 9.  Clear amniotic fluid.  Intact placenta, three vessel cord.  Normal uterus, fallopian tubes and ovaries bilaterally.  ANESTHESIA: Epidural  INTRAVENOUS FLUIDS: 1000 ml   ESTIMATED BLOOD LOSS: 1300 ml URINE OUTPUT:  200 ml SPECIMENS: Placenta sent to pathology COMPLICATIONS: None immediate  PROCEDURE IN DETAIL:  The patient preoperatively received intravenous antibiotics and had sequential compression devices applied to her lower extremities.  She was then taken to  the operating room where the epidural anesthesia was dosed up to surgical level and was found to be adequate. She was then placed in a dorsal supine position with a leftward tilt, and prepped and draped in a sterile manner.  A foley catheter was placed into her bladder and attached to constant gravity.  After an adequate timeout was performed, a Pfannenstiel skin incision was made with scalpeland carried through to the underlying layer of fascia. The fascia was incised in the midline, and this incision was extended bilaterally using the Mayo scissors.  Kocher clamps were applied to the superior aspect of the fascial incision and the underlying rectus muscles were dissected off bluntly.  A similar process was carried out on the inferior aspect of the fascial incision. The rectus muscles were separated in the midline and the peritoneum was entered bluntly. The Alexis self-retaining retractor was introduced into the abdominal cavity.  Attention was turned to the lower uterine segment where a low transverse hysterotomy was made with a scalpel and extended bilaterally bluntly. There was significant bleeding from the hysterotomy, likely from the venous sinuses prior to infant delivery. The infant was successfully delivered, the cord was clamped and cut after one minute, and the infant was handed over to the awaiting neonatology team. Uterine massage was then administered, and the placenta delivered intact with a three-vessel cord. The uterus was then cleared of clots and debris.  The hysterotomy was closed with 0 Vicryl in a running locked fashion, and an imbricating layer was also placed with 0 Vicryl. The pelvis was cleared of all clot and debris. Hemostasis was confirmed on all surfaces.  The retractor was removed.  The peritoneum was closed with a 0 Vicryl running stitch. The fascia was then closed using 0 PDS in a running fashion.  The subcutaneous layer was irrigated, reapproximated with 2-0 plain gut interrupted  stitches, and the skin was closed with a 4-0 Vicryl subcuticular stitch. After the skin was closed, a Prevena disposable negative pressure wound therapy device was placed over the incision.  The suction was activated at a pressure of 80mmHg.  The adhesive was affixed well and there were no leaks noted. The patient tolerated the procedure well. Sponge, instrument and needle counts were correct x 3.  She was taken to the recovery room in stable condition.    Kelli CollinsUGONNA  ANYANWU, MD, FACOG Obstetrician & Gynecologist, [facility], [facility]

## 2018-06-23 NOTE — Progress Notes (Signed)
In to check patient, pitocin has been off for Cat II strip that has improved. Overall reassuring with moderate variability. On exam, has some scarring at cervical os, was able to break up scarring bluntly and os open to fingertip. Reviewed course and plan with patient, recommendation for MgSO4 for pre-eclampsia with severe features based on severe range BP. Reviewed would proceed with c-section for fetal indications or would discuss at maternal request but would not call her failed induction at this point. Patient states she is willing to continue induction but indicates she may want to proceed with c-section. Will restart pitocin when strip improves. Patient agreeable to plan.   Baldemar LenisK. Meryl Tvisha Schwoerer, M.D. Center for [facility]

## 2018-06-23 NOTE — Progress Notes (Signed)
Kelli Miller is a 37 y.o. G4P0030 at 423w3d admitted for induction of labor due to [facility] with superimposed preeclampsia diagnosed after admission.  Subjective: Pt comfortable with epidural. Family in room for support.  Objective: BP (!) 141/37   Pulse 69   Temp 98.9 F (37.2 C) (Oral)   Resp 20   Ht 5\' 1"  (1.549 m)   Wt 109.7 kg   LMP 10/04/2017   SpO2 100%   BMI 45.70 kg/m  I/O last 3 completed shifts: In: 1975.4 [I.V.:1975.4] Out: -  Total I/O In: 1598.9 [I.V.:1498.9; IV Piggyback:100] Out: 1750 [Urine:1750]  FHT: In last 1 hour, FHR baseline 130 with moderate variability, no accels and no decels Contractions not tracing well, toco readjusted, palpate Q 5-6 minutes  SVE:   Dilation: 1 Effacement (%): 100 Station: -1 Exam by:: Kelli Miller cnm Attempt to place FB with speculum unsucessful so Dr Kelli Miller called to room.  FB placed by Dr Kelli Miller. Pt tolerated well.    Labs: Lab Results  Component Value Date   WBC 12.2 (H) 06/23/2018   HGB 13.1 06/23/2018   HCT 37.5 06/23/2018   MCV 92.6 06/23/2018   PLT 214 06/23/2018    Assessment / Plan: Induction of labor due to HTN GBS positive on PCN  Labor: Progressing normally Preeclampsia:  on magnesium sulfate Fetal Wellbeing:  Category I Pain Control:  Epidural I/D:  GBS pos Anticipated MOD:  NSVD  Kelli Miller 06/23/2018, 12:06 PM

## 2018-06-23 NOTE — Transfer of Care (Signed)
Immediate Anesthesia Transfer of Care Note  Patient: Kelli Miller  Procedure(s) Performed: CESAREAN SECTION (N/A )  Patient Location: PACU  Anesthesia Type:Epidural  Level of Consciousness: awake, alert  and oriented  Airway & Oxygen Therapy: Patient Spontanous Breathing  Post-op Assessment: Report given to RN and Post -op Vital signs reviewed and stable  Post vital signs: Reviewed and stable  Last Vitals:  Vitals Value Taken Time  BP 106/60 06/23/2018  7:02 PM  Temp    Pulse 69 06/23/2018  7:06 PM  Resp 12 06/23/2018  7:06 PM  SpO2 100 % 06/23/2018  7:06 PM  Vitals shown include unvalidated device data.  Last Pain:  Vitals:   06/23/18 1655  TempSrc:   PainSc: 4          Complications: No apparent anesthesia complications

## 2018-06-23 NOTE — Progress Notes (Signed)
Patient with persistent headache and repeated severe ranges over a course >4hours.  Will start mag  Patient also with recent pit break after epidural, will restart at slow rate.  Cervix closed but thin per last cervical check.  Pain controlled on epidural  DM: sliding scale Q4, minimal insulin need so far this admission  -Dr. Parke SimmersBland

## 2018-06-23 NOTE — Consult Note (Signed)
Neonatology Note:   Attendance at C-section:    I was asked by Dr. Macon LargeAnyanwu to attend this primary C/S at 37 3/7 weeks after IOL and NRFHR. The mother is a G4P0A3 AB pos, GBS positive with chronic HTN on magnesium sulfate, Type 2 DM, on insulin, hypothyroidism, and AMA. She also has PCOS and antiphospholipid antibody syndrome. She is on Lovenox, Labetalol, Synthroid, Aspirin, Metformin, and Insulin. Had trichomoniasis 8/5, treated. ROM 14 hours prior to delivery, fluid clear. On Pen G > 4 hours prior to delivery, mother afebrile. Infant vigorous with good spontaneous cry and tone. Delayed cord clamping was done. Needed only minimal bulb suctioning. Ap 9/9. Lungs clear to ausc in DR. Infant is able to remain with her mother for skin to skin time under nursing supervision. Transferred to the care of Pediatrician.   Doretha Souhristie C. Broughton Eppinger, MD

## 2018-06-23 NOTE — Progress Notes (Addendum)
[name] is a 37 y.o. G4P0030 at 6130w3d admitted for induction of labor [facility] with superimposed preeclampsia diagnosed on admissin.  Subjective: Pt comfortable with epidural. Family in room for support.  Objective: BP 136/76   Pulse 82   Temp 98.5 F (36.9 C) (Oral)   Resp 18   Ht 5\' 1"  (1.549 m)   Wt 109.7 kg   LMP 10/04/2017   SpO2 100%   BMI 45.70 kg/m  I/O last 3 completed shifts: In: 1975.4 [I.V.:1975.4] Out: -  Total I/O In: 2236.4 [I.V.:2036.4; IV Piggyback:200] Out: 2200 [Urine:2200]  FHT:  FHR: 135 bpm, variability: moderate,  accelerations:  Present,  decelerations:  Present occasional prolonged decelerations and variables, resolve temporarily with position change, fluid bolus then return. moderate variabily continues. Positive scalp stimulation with cervical exam.  UC:   regular, every 3-4 minutes SVE:   Dilation: 6 Effacement (%): 100 Station: -1 Exam by:: leftwich kirby  Labs: Lab Results  Component Value Date   WBC 12.2 (H) 06/23/2018   HGB 13.1 06/23/2018   HCT 37.5 06/23/2018   MCV 92.6 06/23/2018   PLT 214 06/23/2018    Assessment / Plan: Induction of labor due to HTN Pitocin off since 5 am for FHR decels GBS positive on PCN  Labor: Pt with inadequate contractions and no cervical change in several hours.  Unable to restart Pitocin with decelerations. Consult Dr Macon LargeAnyanwu to evaluate tracing and discuss plan of care. Dr Macon LargeAnyanwu to bedside. Preeclampsia:  n/a Fetal Wellbeing:  Category II Pain Control:  Epidural I/D:  GBS pos on PCN Anticipated MOD:  unsure  Sharen CounterLisa Leftwich-Kirby 06/23/2018, 4:54 PM

## 2018-06-23 NOTE — Progress Notes (Signed)
[name] is a 37 y.o. G4P0030 at 4297w3d admitted for induction of labor [facility] with superimposed preeclampsia diagnosed on admission.  Subjective: Called to evaluate patient with recurrent prolonged FHR deceleration. No pitocin was started.  On my arrival, FHR decel had resolved. Patient is comfortable, asking for cesarean section given recurrent nature of these decelerations. She is worried about her "baby's safety".   Objective: BP 137/62   Pulse 75   Temp 98.5 F (36.9 C) (Oral)   Resp 18   Ht 5\' 1"  (1.549 m)   Wt 109.7 kg   LMP 10/04/2017   SpO2 100%   BMI 45.70 kg/m  I/O last 3 completed shifts: In: 1975.4 [I.V.:1975.4] Out: -  Total I/O In: 2236.4 [I.V.:2036.4; IV Piggyback:200] Out: 2400 [Urine:2400]  FHT:  FHR: 135 bpm, variability: moderate,  accelerations:  Present,  decelerations:  Present occasional prolonged decelerations and variables, resolve temporarily with position change  UC:   regular, every 3-4 minutes SVE:   Dilation: 6 Effacement (%): 100 Station: -1 Exam by:: leftwich kirby  Labs: Lab Results  Component Value Date   WBC 12.2 (H) 06/23/2018   HGB 13.1 06/23/2018   HCT 37.5 06/23/2018   MCV 92.6 06/23/2018   PLT 214 06/23/2018    Assessment / Plan: Induction of labor due to [facility] with superimposed severe preeclampsia and T2DM Pitocin off since 5 am for FHR decels  Given recurrent FHR decelerations and inability to restart pitocin due to worsening FHR decels (have only been able to go up to 4 mu/min), and no cervical change without adequate contractions, cesarean delivery was recommended. The risks of cesarean section discussed with the patient included but were not limited to: bleeding which may require transfusion or reoperation; infection which may require antibiotics; injury to bowel, bladder, ureters or other surrounding organs; injury to the fetus; need for additional procedures including hysterectomy in the event of a life-threatening  hemorrhage; placental abnormalities wth subsequent pregnancies, incisional problems, thromboembolic phenomenon and other postoperative/anesthesia complications. The patient concurred with the proposed plan, giving informed written consent for the procedure.    Anesthesia and OR aware. Preoperative prophylactic antibiotics and SCDs ordered on call to the OR.  To OR when ready.   Jaynie CollinsUgonna Charday Capetillo, MD 06/23/2018, 5:21 PM

## 2018-06-23 NOTE — Progress Notes (Signed)
Kelli Miller is a 37 y.o. G4P0030 at 2937w3d by LMP admittedPleas Koch for [facility] of labor [facility], now superimposed preeclampsia with severe range BPs.  Subjective: Pt comfortable with epidural. Family in room for support.  Objective: BP (!) 101/44   Pulse 68   Temp 98.8 F (37.1 C) (Oral)   Resp 20   Ht 5\' 1"  (1.549 m)   Wt 109.7 kg   LMP 10/04/2017   SpO2 97%   BMI 45.70 kg/m  I/O last 3 completed shifts: In: 1975.4 [I.V.:1975.4] Out: -  Total I/O In: 1210.7 [I.V.:1110.7; IV Piggyback:100] Out: 1300 [Urine:1300]  FHT:  FHR baseline 130, moderate variability, occasional 15x15 accels, frequent 10 x 10 accels.  In last 1 hour, prolonged decel x 2 lasting 2 minutes down to 60s, variables x 2-3 lasting <1 minute, isolated late x 1 UC:   Irregular Q 2-10 minutes, intermittent tracing due to pt position, toco readjusted for improved tracing  SVE:   Dilation: 1 Effacement (%): 100 Station: -1 Exam by:: lisa leftwich kirby cnm  Labs: Lab Results  Component Value Date   WBC 12.2 (H) 06/23/2018   HGB 13.1 06/23/2018   HCT 37.5 06/23/2018   MCV 92.6 06/23/2018   PLT 214 06/23/2018    Assessment / Plan: Induction of labor due to HTN Pitocin off since 5 am   Labor: Cervix 100% effaced with fetal head well applied. Contractions tracing intermittently so toco readjusted for improved tracing.  Consider early labor vs protracted early labor with Pitocin off.  Consult Dr Macon LargeAnyanwu. Will monitor FHR x 1 hour, then consider FB with speculum if cervix remains 1 cm. Preeclampsia:  on magnesium sulfate Fetal Wellbeing:  Category II Pain Control:  Epidural I/D:  GBS pos on PCN Anticipated MOD:  NSVD  Sharen CounterLisa Leftwich-Kirby 06/23/2018, 9:27 AM

## 2018-06-23 NOTE — Anesthesia Procedure Notes (Signed)
Epidural Patient location during procedure: OB Start time: 06/23/2018 4:49 AM End time: 06/23/2018 4:53 AM  Staffing Anesthesiologist: Leilani AbleHatchett, Shemiah Rosch, MD Performed: anesthesiologist   Preanesthetic Checklist Completed: patient identified, site marked, surgical consent, pre-op evaluation, timeout performed, IV checked, risks and benefits discussed and monitors and equipment checked  Epidural Patient position: sitting Prep: site prepped and draped and DuraPrep Patient monitoring: continuous pulse ox and blood pressure Approach: midline Location: L3-L4 Injection technique: LOR air  Needle:  Needle type: Tuohy  Needle gauge: 17 G Needle length: 9 cm and 9 Needle insertion depth: 9 cm Catheter type: closed end flexible Catheter size: 19 Gauge Catheter at skin depth: 15 cm Test dose: negative and Other  Assessment Sensory level: T9 Events: blood not aspirated, injection not painful, no injection resistance, negative IV test and no paresthesia  Additional Notes Reason for block:procedure for pain

## 2018-06-23 NOTE — Lactation Note (Signed)
This note was copied from a baby's chart. Lactation Consultation Note Baby 4 hrs old, 37 3/7 wks. Wt. 5. 15 lbs. Mom has several endocrine issues that can affect milk supply. Mom had difficulty and loss 3 pregnancies.  Mom states had no increase in breast during pregnancy. Mom has "V" shaped breast w/short shaft everted nipples at the bottom end of breast.  LC hand expressed w/breast massage. Nothing noted. Mom plans to pump and bottle feed as well as BF. Mom is ok with giving formula. Mom stated she is prepared to only be able to give formula d/t disorders. Baby has very low blood sugar, less than 20, sleepy, not latching or going to breast. Has very low temperature. STS not helpful. Mom is on MAg. Very sleepy. Isn't safe for mom to hold baby unattended. Baby in bassinet. RN taking baby to CN to place under warmer. Baby may need to be transferred to NICU if issues do not resolve. LC talking w/mom, mom's eyes are rolling and is so sleepy. Mom has friend in room. She is assisting mom.  RN set up DEBP, mom has pumped w/nothing noted. Discussed importance of supply and demand. Encouraged mom to pump q3hrs. Mom will need more teaching d/t impaired awareness at this time. WH/LC brochure given w/resources, support groups and LC services.  Patient Name: Kelli Miller NWGNF'AToday's Date: 06/23/2018 Reason for consult: Initial assessment;Maternal endocrine disorder;Infant < 6lbs;Early term 37-38.6wks Type of Endocrine Disorder?: PCOS(PCOS, DM. Hypothriodism, antiphospholipid antibodies syndrom)   Maternal Data Has patient been taught Hand Expression?: Yes Does the patient have breastfeeding experience prior to this delivery?: No  Feeding Feeding Type: Breast Fed  LATCH Score Latch: Too sleepy or reluctant, no latch achieved, no sucking elicited.  Audible Swallowing: None  Type of Nipple: Everted at rest and after stimulation  Comfort (Breast/Nipple): Soft / non-tender  Hold (Positioning):  Assistance needed to correctly position infant at breast and maintain latch.  LATCH Score: 5  Interventions Interventions: Breast feeding basics reviewed;Support pillows;Breast massage;Hand express;Pre-pump if needed;DEBP;Breast compression  Lactation Tools Discussed/Used Tools: Pump Breast pump type: Double-Electric Breast Pump Pump Review: Setup, frequency, and cleaning;Milk Storage Initiated by:: RN Date initiated:: 06/23/18   Consult Status Consult Status: Follow-up Date: 06/24/18 Follow-up type: In-patient    Charyl DancerCARVER, Doyal Saric G 06/23/2018, 10:09 PM

## 2018-06-23 NOTE — Plan of Care (Signed)
POC discussed with team and patient.  Currently on pitocin and resting comfortably in bed.  Will continue to monitor and discuss with team as needed.

## 2018-06-23 NOTE — Anesthesia Preprocedure Evaluation (Addendum)
Anesthesia Evaluation  Patient identified by MRN, date of birth, ID band Patient awake    Reviewed: Allergy & Precautions, H&P , NPO status , Patient's Chart, lab work & pertinent test results, reviewed documented beta blocker date and time   Airway Mallampati: III  TM Distance: >3 FB Neck ROM: full    Dental no notable dental hx. (+) Teeth Intact   Pulmonary neg pulmonary ROS, former smoker,    Pulmonary exam normal breath sounds clear to auscultation       Cardiovascular hypertension, Pt. on medications and Pt. on home beta blockers Normal cardiovascular exam Rhythm:regular Rate:Normal     Neuro/Psych negative neurological ROS  negative psych ROS   GI/Hepatic negative GI ROS, Neg liver ROS,   Endo/Other  diabetes, GestationalMorbid obesity  Renal/GU negative Renal ROS  negative genitourinary   Musculoskeletal   Abdominal (+) + obese,   Peds  Hematology negative hematology ROS (+)   Anesthesia Other Findings   Reproductive/Obstetrics (+) Pregnancy                             Anesthesia Physical Anesthesia Plan  ASA: III  Anesthesia Plan: Epidural   Post-op Pain Management:    Induction:   PONV Risk Score and Plan:   Airway Management Planned:   Additional Equipment:   Intra-op Plan:   Post-operative Plan:   Informed Consent: I have reviewed the patients History and Physical, chart, labs and discussed the procedure including the risks, benefits and alternatives for the proposed anesthesia with the patient or authorized representative who has indicated his/her understanding and acceptance.     Plan Discussed with:   Anesthesia Plan Comments:         Anesthesia Quick Evaluation

## 2018-06-24 ENCOUNTER — Encounter (HOSPITAL_COMMUNITY): Payer: Self-pay | Admitting: Obstetrics & Gynecology

## 2018-06-24 LAB — GLUCOSE, CAPILLARY
Glucose-Capillary: 116 mg/dL — ABNORMAL HIGH (ref 70–99)
Glucose-Capillary: 102 mg/dL — ABNORMAL HIGH (ref 70–99)

## 2018-06-24 LAB — COMPREHENSIVE METABOLIC PANEL
ALT: 16 U/L (ref 0–44)
AST: 20 U/L (ref 15–41)
Albumin: 2.4 g/dL — ABNORMAL LOW (ref 3.5–5.0)
Alkaline Phosphatase: 71 U/L (ref 38–126)
Anion gap: 7 (ref 5–15)
BUN: 6 mg/dL (ref 6–20)
CO2: 23 mmol/L (ref 22–32)
Calcium: 7.1 mg/dL — ABNORMAL LOW (ref 8.9–10.3)
Chloride: 102 mmol/L (ref 98–111)
Creatinine, Ser: 0.69 mg/dL (ref 0.44–1.00)
GFR calc Af Amer: >60 mL/min (ref >60)
GFR calc non Af Amer: >60 mL/min (ref >60)
Glucose, Bld: 92 mg/dL (ref 70–99)
Potassium: 4.5 mmol/L (ref 3.5–5.1)
Sodium: 132 mmol/L — ABNORMAL LOW (ref 135–145)
Total Bilirubin: 0.6 mg/dL (ref 0.3–1.2)
Total Protein: 5.4 g/dL — ABNORMAL LOW (ref 6.5–8.1)

## 2018-06-24 LAB — CBC
HEMATOCRIT: 32 % — AB (ref 36.0–46.0)
HEMOGLOBIN: 10.9 g/dL — AB (ref 12.0–15.0)
MCH: 32.2 pg (ref 26.0–34.0)
MCHC: 34.1 g/dL (ref 30.0–36.0)
MCV: 94.7 fL (ref 78.0–100.0)
Platelets: 206 10*3/uL (ref 150–400)
RBC: 3.38 MIL/uL — ABNORMAL LOW (ref 3.87–5.11)
RDW: 14.4 % (ref 11.5–15.5)
WBC: 14.4 10*3/uL — AB (ref 4.0–10.5)

## 2018-06-24 LAB — MAGNESIUM: Magnesium: 4.9 mg/dL — ABNORMAL HIGH (ref 1.7–2.4)

## 2018-06-24 NOTE — Lactation Note (Signed)
This note was copied from a baby's chart. Lactation Consultation Note; Mom has just finished pumping. Discouraged that she did not obtain any Colostrum.  Encouragement given. Baby waking for feeding Assisted mom with latch in football hold after undressing baby. Baby latched well and nursed for 15 min- needed some stimulation to continue nursing. Mom reports this is better than she usually does. Offered the other breast but baby going to sleep and would not latch. Mom asking about taking Fenugreek. Has some with her. Suggested waiting a few days to see how her supply does. She had no changes in her breasts during pregnancy and is an infertility patient. Encouraged to watch for feeding cues and supplement as needed.  Encouraged to continue pumping after nursing to promote milk supply. Has Spectra pump for home.No questions at present. To call for assist prn  Patient Name: Kelli Miller ZOXWR'UToday's Date: 06/24/2018 Reason for consult: Follow-up assessment Type of Endocrine Disorder?: Thyroid   Maternal Data Formula Feeding for Exclusion: No Has patient been taught Hand Expression?: Yes Does the patient have breastfeeding experience prior to this delivery?: No  Feeding Feeding Type: Breast Fed Length of feed: 15 min  LATCH Score Latch: Grasps breast easily, tongue down, lips flanged, rhythmical sucking.  Audible Swallowing: None  Type of Nipple: Everted at rest and after stimulation  Comfort (Breast/Nipple): Soft / non-tender  Hold (Positioning): Full assist, staff holds infant at breast  LATCH Score: 6  Interventions Interventions: Breast feeding basics reviewed;Assisted with latch;Hand express;Breast compression  Lactation Tools Discussed/Used Breast pump type: Double-Electric Breast Pump   Consult Status Consult Status: Follow-up Date: 06/25/18 Follow-up type: In-patient    Pamelia HoitWeeks, Diamantina Edinger D 06/24/2018, 9:45 AM

## 2018-06-24 NOTE — Progress Notes (Signed)
Subjective: Postpartum Day 1: Cesarean Delivery Patient reports feeling well. She denies CP, SOB, lightheadedness/dizziness    Objective: Vital signs in last 24 hours: Temp:  [97.6 F (36.4 C)-99.1 F (37.3 C)] 97.9 F (36.6 C) (08/30 0734) Pulse Rate:  [61-82] 72 (08/30 0734) Resp:  [16-20] 18 (08/30 0734) BP: (102-142)/(37-79) 125/73 (08/30 0734) SpO2:  [93 %-100 %] 94 % (08/30 0734) CBG (last 3)  Recent Labs    06/23/18 1630 06/23/18 1909 06/23/18 2130  GLUCAP 176* 116* 100*    Physical Exam:  General: alert, cooperative and no distress Lochia: appropriate Uterine Fundus: firm Incision: wound vac dressing intact DVT Evaluation: No evidence of DVT seen on physical exam.  Recent Labs    06/23/18 0402 06/24/18 0558  HGB 13.1 10.9*  HCT 37.5 32.0*    Assessment/Plan: Status post Cesarean section. Doing well postoperatively.  Continue magnesium sulfate for seizure prophylaxis Metformin for type 2 DM Lovenox for DVT prophylaxis Continue routine postop care.  Rion Catala 06/24/2018, 9:38 AM

## 2018-06-24 NOTE — Anesthesia Postprocedure Evaluation (Signed)
Anesthesia Post Note  Patient: Kelli Miller  Procedure(s) Performed: CESAREAN SECTION (N/A )     Patient location during evaluation: Mother Baby Anesthesia Type: Epidural Level of consciousness: awake and alert and oriented Pain management: pain level controlled Vital Signs Assessment: post-procedure vital signs reviewed and stable Respiratory status: spontaneous breathing and nonlabored ventilation Cardiovascular status: stable Postop Assessment: no headache, no backache, patient able to bend at knees, no signs of nausea or vomiting and adequate PO intake Anesthetic complications: no    Last Vitals:  Vitals:   06/24/18 0635 06/24/18 0734  BP:  125/73  Pulse:  72  Resp: 18 18  Temp:  36.6 C  SpO2: 94% 94%    Last Pain:  Vitals:   06/24/18 0740  TempSrc:   PainSc: 0-No pain   Pain Goal: Patients Stated Pain Goal: 3 (06/23/18 2025)               Madison HickmanGREGORY,Lylla Eifler

## 2018-06-24 NOTE — Progress Notes (Signed)
Postpartum Day 1: Cesarean Delivery after failed IOL for chronic hypertension with superimposed severe preeclampsia; non-reassuring fetal status; Type II DM  Subjective: Denies any headaches, visual symptoms, but endorses feeling a little dizzy due to the magnesium sulfate. Breastfeeding. Baby stable at bedside. Limited OOB. Some incisional pain. No flatus yet.    Objective: Vital signs in last 24 hours: Temp:  [97.6 F (36.4 C)-99.1 F (37.3 C)] 97.6 F (36.4 C) (08/30 0430) Pulse Rate:  [59-82] 61 (08/30 0430) Resp:  [16-20] 18 (08/30 0635) BP: (101-146)/(37-79) 109/70 (08/30 0430) SpO2:  [93 %-100 %] 94 % (08/30 0635)  Patient Vitals for the past 24 hrs:  BP Temp Temp src Pulse Resp SpO2  06/24/18 0734 125/73 97.9 F (36.6 C) Oral 72 18 94 %  06/24/18 0635 - - - - 18 94 %  06/24/18 0528 - - - - 17 95 %  06/24/18 0430 109/70 97.6 F (36.4 C) Oral 61 20 95 %  06/24/18 0330 - - - - 16 94 %  06/24/18 0230 - - - - 17 94 %  06/24/18 0130 - - - - 16 95 %  06/24/18 0036 119/68 97.7 F (36.5 C) Oral 73 20 94 %  06/23/18 2325 113/69 97.7 F (36.5 C) Oral 66 18 93 %  06/23/18 2226 112/64 97.6 F (36.4 C) Oral 72 18 93 %  06/23/18 2125 105/60 97.9 F (36.6 C) Oral 66 20 94 %  06/23/18 2025 (!) 111/59 99.1 F (37.3 C) Oral 62 18 96 %  06/23/18 1945 (!) 107/52 97.9 F (36.6 C) - 62 18 100 %  06/23/18 1930 102/64 - - 74 17 100 %  06/23/18 1915 107/69 98.9 F (37.2 C) - 65 16 100 %  06/23/18 1903 106/60 - - - - 100 %  06/23/18 1716 (!) 142/73 - - 78 - -  06/23/18 1700 137/62 - - 75 - -  06/23/18 1631 136/76 98.5 F (36.9 C) Oral 82 - -  06/23/18 1600 131/62 - - 71 18 -  06/23/18 1531 132/63 - - 74 - -  06/23/18 1500 140/68 - - 74 18 -  06/23/18 1430 134/67 98.7 F (37.1 C) Oral 71 - -  06/23/18 1401 131/63 - - 62 - -  06/23/18 1300 (!) 124/49 - - 63 18 -  06/23/18 1245 (!) 122/54 - - 73 - -  06/23/18 1231 (!) 123/49 - - 68 - -  06/23/18 1200 (!) 141/37 98.9 F (37.2 C)  Oral 69 18 -  06/23/18 1130 135/79 - - 69 - 100 %  06/23/18 1101 124/76 - - 82 20 -  06/23/18 1031 (!) 128/50 - - 75 - -  06/23/18 1004 (!) 133/57 - - 81 - -  06/23/18 1000 - 98.8 F (37.1 C) Oral - 20 -  06/23/18 0930 128/77 - - 71 - -  06/23/18 0900 (!) 101/44 - - 68 20 -  06/23/18 0800 (!) 146/65 - - 66 - -  06/23/18 0750 (!) 133/58 - - 78 18 -  06/23/18 0740 (!) 115/58 - - (!) 59 18 -    Intake/Output Summary (Last 24 hours) at 06/24/2018 0738 Last data filed at 06/24/2018 0736 Gross per 24 hour  Intake 4845.62 ml  Output 4546 ml  Net 299.62 ml    Physical Exam:  General: alert and no distress Lochia: appropriate Uterine Fundus: firm Incision: no significant drainage; Prevena in place DVT Evaluation: No evidence of DVT seen on physical exam. Negative  Homan's sign. No cords or calf tenderness. No significant calf/ankle edema.  CBG (last 3)  Recent Labs    06/23/18 1210 06/23/18 1630 06/23/18 2130  GLUCAP 83 176* 100*     CBC Latest Ref Rng & Units 06/24/2018 06/23/2018 06/22/2018  WBC 4.0 - 10.5 K/uL 14.4(H) 12.2(H) 12.6(H)  Hemoglobin 12.0 - 15.0 g/dL 10.9(L) 13.1 12.3  Hematocrit 36.0 - 46.0 % 32.0(L) 37.5 35.6(L)  Platelets 150 - 400 K/uL 206 214 236    CMP Latest Ref Rng & Units 06/24/2018 06/23/2018 06/22/2018  Glucose 70 - 99 mg/dL 92 161(W143(H) 960(A166(H)  BUN 6 - 20 mg/dL 6 6 7   Creatinine 0.44 - 1.00 mg/dL 5.400.69 9.810.51 1.910.57  Sodium 135 - 145 mmol/L 132(L) 135 134(L)  Potassium 3.5 - 5.1 mmol/L 4.5 3.8 3.5  Chloride 98 - 111 mmol/L 102 105 106  CO2 22 - 32 mmol/L 23 20(L) 17(L)  Calcium 8.9 - 10.3 mg/dL 7.1(L) 8.5(L) 8.9  Total Protein 6.5 - 8.1 g/dL 4.7(W5.4(L) 6.7 6.0(L)  Total Bilirubin 0.3 - 1.2 mg/dL 0.6 0.5 0.6  Alkaline Phos 38 - 126 U/L 71 92 98  AST 15 - 41 U/L 20 16 24   ALT 0 - 44 U/L 16 17 19    Assessment/Plan: Status post Cesarean section. Doing well postoperatively.  Stable BP, magnesium sulfate for 24 hour PP Stable CBGs, on Metformin 1000 mg  bid Stable hemoglobin, not anemic Encourage OOB. Lovenox ordered for DVT ppx, patient has APS, will need for 6 weeks PP Breastfeeding, desires POPs for contraception Routine postpartum care  Jaynie CollinsUgonna Anyanwu, MD 06/24/2018, 7:34 AM

## 2018-06-25 LAB — GLUCOSE, CAPILLARY: Glucose-Capillary: 69 mg/dL — ABNORMAL LOW (ref 70–99)

## 2018-06-25 MED ORDER — OXYCODONE-ACETAMINOPHEN 5-325 MG PO TABS: 1.0000 | ORAL_TABLET | Freq: Four times a day (QID) | ORAL | 0 refills | Status: DC | PRN

## 2018-06-25 MED ORDER — AMLODIPINE BESYLATE 10 MG PO TABS
10.0000 mg | ORAL_TABLET | Freq: Every day | ORAL | Status: DC
  Administered 2018-06-25: 10 mg via ORAL
  Filled 2018-06-25: qty 1

## 2018-06-25 MED ORDER — IBUPROFEN 600 MG PO TABS: 600.0000 mg | ORAL_TABLET | Freq: Four times a day (QID) | ORAL | 0 refills | Status: DC

## 2018-06-25 MED ORDER — AMLODIPINE BESYLATE 10 MG PO TABS: 10.0000 mg | ORAL_TABLET | Freq: Every day | ORAL | 1 refills | Status: DC

## 2018-06-25 MED ORDER — AMLODIPINE BESYLATE 10 MG PO TABS: 10.0000 mg | ORAL_TABLET | Freq: Every day | ORAL | Status: DC

## 2018-06-25 NOTE — Progress Notes (Signed)
Discharge instructions and prescriptions given to pt. Discussed post-op care, signs and symptoms to report, breast care, upcoming appointments, and meds. Pt verbalizes understanding and has no questions or concerns at this time. IV taken out and pt tolerated well. Pt discharged from hospital in stable condition.

## 2018-06-25 NOTE — Discharge Summary (Signed)
OB Discharge Summary     Patient Name: Kelli Miller DOB: 1981-01-16 MRN: [id number]  Date of admission: 06/22/2018 Delivering MD: Jaynie Collins A   Date of discharge: 06/25/2018  Admitting diagnosis: INDUCTION Intrauterine pregnancy: [redacted]w[redacted]d     Secondary diagnosis:  Active Problems:   Pre-existing type 2 diabetes mellitus in pregnancy in second trimester   Chronic hypertension in pregnancy   Antiphospholipid antibody syndrome [facility])  Additional problems: preeclampsia     Discharge diagnosis: Term Pregnancy Delivered and CHTN with superimposed preeclampsia                                                                                                Post partum procedures:no  Augmentation: AROM, Pitocin, Cytotec and Foley Balloon  Complications: None  Hospital course:  Induction of Labor With Cesarean Section  37 y.o. yo [phone] at [redacted]w[redacted]d was admitted to the hospital 06/22/2018 for induction of labor. Patient had a labor course significant for [facility] and active phase arrest. The patient went for cesarean section due to Arrest of Dilation, and delivered a Viable infant,06/23/2018  Membrane Rupture Time/Date: 3:55 AM ,06/23/2018   Details of operation can be found in separate operative Note.  Patient had an uncomplicated postpartum course. She is ambulating, tolerating a regular diet, passing flatus, and urinating well.  Patient is discharged home in stable condition on 06/25/18.                                    Physical exam  Vitals:   06/24/18 1543 06/24/18 2004 06/25/18 0014 06/25/18 0341  BP: 137/69 (!) 185/92 (!) 167/78 (!) 151/62  Pulse: 74 88 92 98  Resp: 18 16 16 16   Temp: 99 F (37.2 C) 98.7 F (37.1 C) 98.4 F (36.9 C) 98.6 F (37 C)  TempSrc: Oral Oral Oral Oral  SpO2: 98% 100% 96% 99%  Weight:      Height:       General: alert, cooperative and no distress Lochia: appropriate Uterine Fundus: firm Incision: Dressing is clean, dry, and intact DVT Evaluation: No  evidence of DVT seen on physical exam. Labs: Lab Results  Component Value Date   WBC 14.4 (H) 06/24/2018   HGB 10.9 (L) 06/24/2018   HCT 32.0 (L) 06/24/2018   MCV 94.7 06/24/2018   PLT 206 06/24/2018   CMP Latest Ref Rng & Units 06/24/2018  Glucose 70 - 99 mg/dL 92  BUN 6 - 20 mg/dL 6  Creatinine 6.96 - 2.95 mg/dL 2.84  Sodium 132 - 440 mmol/L 132(L)  Potassium 3.5 - 5.1 mmol/L 4.5  Chloride 98 - 111 mmol/L 102  CO2 22 - 32 mmol/L 23  Calcium 8.9 - 10.3 mg/dL 7.1(L)  Total Protein 6.5 - 8.1 g/dL 1.0(U)  Total Bilirubin 0.3 - 1.2 mg/dL 0.6  Alkaline Phos 38 - 126 U/L 71  AST 15 - 41 U/L 20  ALT 0 - 44 U/L 16    Discharge instruction: per After Visit Summary and "Baby and Me Booklet".  After  visit meds:  Allergies as of 06/25/2018      Reactions   Bactrim [sulfamethoxazole-trimethoprim] Rash      Medication List    STOP taking these medications   HUMALOG 100 UNIT/ML injection Generic drug:  insulin lispro   insulin NPH Human 100 UNIT/ML injection Commonly known as:  HUMULIN N,NOVOLIN N     TAKE these medications   amLODipine 10 MG tablet Commonly known as:  NORVASC Take 1 tablet (10 mg total) by mouth daily. Start taking on:  06/26/2018   aspirin 81 MG chewable tablet Chew by mouth daily.   diphenhydrAMINE 50 MG capsule Commonly known as:  BENADRYL Take 50 mg by mouth at bedtime as needed for sleep.   enoxaparin 40 MG/0.4ML injection Commonly known as:  LOVENOX Inject 0.4 mLs (40 mg total) into the skin daily.   ibuprofen 600 MG tablet Commonly known as:  ADVIL,MOTRIN Take 1 tablet (600 mg total) by mouth every 6 (six) hours.   Iron (Ferrous Gluconate) 256 (28 Fe) MG Tabs 1 tablet BID   labetalol 200 MG tablet Commonly known as:  NORMODYNE TAKE 1 TABLET BY MOUTH TWICE DAILY   levothyroxine 100 MCG tablet Commonly known as:  SYNTHROID, LEVOTHROID Take 100 mcg by mouth daily before breakfast.   levothyroxine 50 MCG tablet Commonly known as:   SYNTHROID, LEVOTHROID   Melatonin 3 MG Caps Take 3 mg by mouth at bedtime as needed (sleep).   metFORMIN 1000 MG tablet Commonly known as:  GLUCOPHAGE Take 1,000 mg by mouth 2 (two) times daily with a meal.   oxyCODONE-acetaminophen 5-325 MG tablet Commonly known as:  PERCOCET/ROXICET Take 1-2 tablets by mouth every 6 (six) hours as needed (pain scale > 7).   prenatal multivitamin Tabs tablet Take 1 tablet by mouth daily at 12 noon.   vitamin B-12 100 MCG tablet Commonly known as:  CYANOCOBALAMIN Take 100 mcg by mouth daily.   Vitamin D 2000 units Caps Take 2,000 Units by mouth daily.       Diet: carb modified diet  Activity: Advance as tolerated. Pelvic rest for 6 weeks.   Outpatient follow up:5 days Follow up Appt: Future Appointments  Date Time Provider Department Center  07/12/2018  1:30 PM [facility] NURSE [facility]   Follow up Visit:No follow-ups on file.  Postpartum contraception: Undecided  Newborn Data: Live born female  Birth Weight: 5 lb 15.1 oz (2695 g) APGAR: 9, 9  Newborn Delivery   Birth date/time:  06/23/2018 17:59:00 Delivery type:  C-Section, Low Transverse Trial of labor:  Yes C-section categorization:  Primary     Baby Feeding: Breast Disposition:home with mother   06/25/2018 Scheryl DarterJames Rylend Pietrzak, MD

## 2018-06-25 NOTE — Lactation Note (Signed)
This note was copied from a baby's chart. Lactation Consultation Note: Follow up visit with mom. She reports baby was still acting hungry after nursing and she started giving formula during the night. States she will just give formula until her milk comes in. Suggested that she continue to nurse baby to help promote milk supply. Can give a little formula before nursing if baby is real fussy. Encouraged to continue pumping also. Reports no pain with latch. No questions at present. Encouragement given.   Patient Name: Kelli Miller ZOXWR'UToday's Date: 06/25/2018 Reason for consult: Follow-up assessment;Early term 37-38.6wks Type of Endocrine Disorder?: Thyroid   Maternal Data Formula Feeding for Exclusion: No Has patient been taught Hand Expression?: Yes Does the patient have breastfeeding experience prior to this delivery?: No  Feeding Feeding Type: Bottle Fed - Formula  LATCH Score                   Interventions    Lactation Tools Discussed/Used     Consult Status Consult Status: Follow-up Date: 06/26/18    Pamelia HoitWeeks, Leeasia Secrist D 06/25/2018, 7:22 AM

## 2018-06-29 ENCOUNTER — Ambulatory Visit (HOSPITAL_COMMUNITY): Payer: PRIVATE HEALTH INSURANCE

## 2018-06-30 ENCOUNTER — Ambulatory Visit (INDEPENDENT_AMBULATORY_CARE_PROVIDER_SITE_OTHER): Payer: PRIVATE HEALTH INSURANCE | Admitting: Obstetrics & Gynecology

## 2018-06-30 ENCOUNTER — Encounter: Payer: Self-pay | Admitting: Obstetrics & Gynecology

## 2018-06-30 VITALS — BP 140/70 | HR 106 | Ht 64.0 in | Wt 225.0 lb

## 2018-06-30 DIAGNOSIS — Z9889 Other specified postprocedural states: Secondary | ICD-10-CM

## 2018-06-30 NOTE — Progress Notes (Signed)
   Subjective:    Patient ID: Kelli Miller, female    DOB: [birth year], 37 y.o.   MRN: [id number]  HPI 37 yo P1 who is s/p PLTCS for non-reassuring FHR during IOL. She is here for provena removal. She has not been checking her sugar and wasn't aware that she should be taking her Lovenox. She has no complaints today.  Her pregnancy was complicated by morbid obesity, chronic HTN, insulin dependent DM, hypothyroidism, antiphospholipid syndrome, and ama.   Review of Systems     Objective:   Physical Exam Breathing, conversing, and ambulating normally Well nourished, well hydrated White female, no apparent distress Incision healed well      Assessment & Plan:  Start Lovenox, start checking sugars Come back 3 weeks

## 2018-07-21 ENCOUNTER — Ambulatory Visit (INDEPENDENT_AMBULATORY_CARE_PROVIDER_SITE_OTHER): Payer: PRIVATE HEALTH INSURANCE | Admitting: Obstetrics & Gynecology

## 2018-07-21 ENCOUNTER — Encounter: Payer: Self-pay | Admitting: Obstetrics & Gynecology

## 2018-07-21 VITALS — BP 150/95 | HR 80 | Wt 217.0 lb

## 2018-07-21 DIAGNOSIS — Z1389 Encounter for screening for other disorder: Secondary | ICD-10-CM

## 2018-07-21 DIAGNOSIS — Z9889 Other specified postprocedural states: Secondary | ICD-10-CM

## 2018-07-21 DIAGNOSIS — E039 Hypothyroidism, unspecified: Secondary | ICD-10-CM

## 2018-07-21 DIAGNOSIS — O99280 Endocrine, nutritional and metabolic diseases complicating pregnancy, unspecified trimester: Principal | ICD-10-CM

## 2018-07-21 MED ORDER — LEVOTHYROXINE SODIUM 50 MCG PO TABS: 50.0000 ug | ORAL_TABLET | Freq: Every day | ORAL | 3 refills | Status: DC

## 2018-07-21 MED ORDER — CLOBETASOL 17 PROPIONATE POWD: 12 refills | Status: DC

## 2018-07-21 NOTE — Progress Notes (Signed)
Post Partum Exam  Kelli Miller is a 37 y.o. married  [phone] female who presents for a postpartum visit. She is 4 weeks postpartum following a low cervical transverse Cesarean section. I have fully reviewed the prenatal and intrapartum course. The delivery was at 37.3 gestational weeks.  Anesthesia: epidural. Postpartum course has been uneventful. Baby's course has been uneventful. Baby is feeding by bottle. Bleeding no bleeding. Bowel function is normal. Bladder function is normal. Patient is not sexually active. Contraception method is oral progesterone-only contraceptive. Postpartum depression screening:neg  The following portions of the patient's history were reviewed and updated as appropriate: allergies, current medications, past family history, past medical history, past social history, past surgical history and problem list. Last pap smear done 11-2018and was Normal  Review of Systems Pertinent items are noted in HPI.    Objective:  Weight 217 lb (98.4 kg), last menstrual period 10/04/2017, unknown if currently breastfeeding.  General:  alert   Breasts:  inspection negative, no nipple discharge or bleeding, no masses or nodularity palpable  Lungs: clear to auscultation bilaterally  Heart:  regular rate and rhythm, S1, S2 normal, no murmur, click, rub or gallop  Abdomen: soft, non-tender; bowel sounds normal; no masses,  no organomegaly   Vulva:  not evaluated  Vagina: not evaluated  Cervix:  not evaluated  Corpus: not examined  Adnexa:  not evaluated  Rectal Exam: Not performed.        Assessment:    Normal postpartum exam. Pap smear not done at today's visit.   Plan:   1. Contraception: Mirena when it arrives. She prefers this one over [place] because the strings are softer. 2. Check TSH and refill synthroid. She will need a fam med doc in the future. 3. Follow up in: 1 year or as needed.

## 2018-07-22 ENCOUNTER — Telehealth: Payer: Self-pay | Admitting: *Deleted

## 2018-07-22 LAB — TSH: TSH: 0.88 mIU/L

## 2018-07-22 NOTE — Telephone Encounter (Signed)
Left patient a message to call and schedule IUD insertion. Mirena is here.

## 2018-08-01 ENCOUNTER — Encounter: Payer: Self-pay | Admitting: Obstetrics & Gynecology

## 2018-08-01 ENCOUNTER — Ambulatory Visit (INDEPENDENT_AMBULATORY_CARE_PROVIDER_SITE_OTHER): Payer: PRIVATE HEALTH INSURANCE | Admitting: Obstetrics & Gynecology

## 2018-08-01 VITALS — BP 138/78 | HR 96 | Wt 217.0 lb

## 2018-08-01 DIAGNOSIS — Z3202 Encounter for pregnancy test, result negative: Secondary | ICD-10-CM

## 2018-08-01 DIAGNOSIS — Z30430 Encounter for insertion of intrauterine contraceptive device: Secondary | ICD-10-CM | POA: Diagnosis not present

## 2018-08-01 DIAGNOSIS — Z01812 Encounter for preprocedural laboratory examination: Secondary | ICD-10-CM

## 2018-08-01 LAB — POCT URINE PREGNANCY: Preg Test, Ur: NEGATIVE

## 2018-08-01 MED ORDER — LEVONORGESTREL 20 MCG/24HR IU IUD
INTRAUTERINE_SYSTEM | Freq: Once | INTRAUTERINE | Status: AC
  Administered 2018-08-01: 14:00:00 via INTRAUTERINE

## 2018-08-11 ENCOUNTER — Ambulatory Visit: Payer: PRIVATE HEALTH INSURANCE | Admitting: Obstetrics & Gynecology

## 2018-08-19 ENCOUNTER — Other Ambulatory Visit: Payer: Self-pay | Admitting: *Deleted

## 2018-08-19 ENCOUNTER — Encounter: Payer: Self-pay | Admitting: *Deleted

## 2018-08-19 MED ORDER — AMLODIPINE BESYLATE 10 MG PO TABS: 10.0000 mg | ORAL_TABLET | Freq: Every day | ORAL | 0 refills | Status: DC

## 2018-08-19 NOTE — Telephone Encounter (Signed)
Pt called stating that she can't get into her PCP until Nov 4 and will run out of her Norvasc.  Will send 1 Rf into [facility] [place].

## 2018-08-29 ENCOUNTER — Ambulatory Visit (INDEPENDENT_AMBULATORY_CARE_PROVIDER_SITE_OTHER): Payer: PRIVATE HEALTH INSURANCE | Admitting: Obstetrics & Gynecology

## 2018-08-29 ENCOUNTER — Encounter: Payer: Self-pay | Admitting: Obstetrics & Gynecology

## 2018-08-29 VITALS — BP 140/86 | HR 93 | Resp 16 | Ht 61.0 in | Wt 220.0 lb

## 2018-08-29 DIAGNOSIS — Z30431 Encounter for routine checking of intrauterine contraceptive device: Secondary | ICD-10-CM

## 2018-08-29 NOTE — Patient Instructions (Signed)
Return to clinic for any scheduled appointments or for any gynecologic concerns as needed.   

## 2018-08-29 NOTE — Progress Notes (Signed)
   GYNECOLOGY OFFICE PROGRESS NOTE  History:  37 y.o. [id number] here today for today for IUD string check; Mirena IUD was placed  08/01/2018. No complaints about the IUD, no concerning side effects.  The following portions of the patient's history were reviewed and updated as appropriate: allergies, current medications, past family history, past medical history, past social history, past surgical history and problem list. Last pap smear on 09/23/2017 was normal, negative HRHPV.  Review of Systems:  Pertinent items are noted in HPI.   Objective:  Physical Exam Blood pressure 140/86, pulse 93, resp. rate 16, height 5\' 1"  (1.549 m), weight 220 lb (99.8 kg), last menstrual period 10/04/2017, not currently breastfeeding. CONSTITUTIONAL: Well-developed, well-nourished female in no acute distress.  HENT:  Normocephalic, atraumatic. External right and left ear normal. Oropharynx is clear and moist EYES: Conjunctivae and EOM are normal. Pupils are equal, round, and reactive to light. No scleral icterus.  NECK: Normal range of motion, supple, no masses CARDIOVASCULAR: Normal heart rate noted RESPIRATORY: Effort and breath sounds normal, no problems with respiration noted ABDOMEN: Soft, no distention noted.   PELVIC: Normal appearing external genitalia; normal appearing vaginal mucosa and cervix.  IUD strings visualized, about 1 cm in length outside cervix.   Assessment & Plan:  Normal Mirena IUD check. Patient to keep IUD in place for five-seven years; can come in for removal if she desires pregnancy. Routine preventative health maintenance measures emphasized.   Jaynie Collins, MD, FACOG Obstetrician & Gynecologist, [facility] for [employer], [facility]

## 2018-08-30 ENCOUNTER — Encounter: Payer: Self-pay | Admitting: Osteopathic Medicine

## 2018-08-30 ENCOUNTER — Ambulatory Visit (INDEPENDENT_AMBULATORY_CARE_PROVIDER_SITE_OTHER): Payer: PRIVATE HEALTH INSURANCE | Admitting: Osteopathic Medicine

## 2018-08-30 VITALS — BP 137/68 | HR 79 | Temp 98.3°F | Ht 61.0 in | Wt 221.0 lb

## 2018-08-30 DIAGNOSIS — E119 Type 2 diabetes mellitus without complications: Secondary | ICD-10-CM | POA: Insufficient documentation

## 2018-08-30 DIAGNOSIS — E538 Deficiency of other specified B group vitamins: Secondary | ICD-10-CM

## 2018-08-30 DIAGNOSIS — E039 Hypothyroidism, unspecified: Secondary | ICD-10-CM

## 2018-08-30 DIAGNOSIS — L409 Psoriasis, unspecified: Secondary | ICD-10-CM

## 2018-08-30 DIAGNOSIS — Z862 Personal history of diseases of the blood and blood-forming organs and certain disorders involving the immune mechanism: Secondary | ICD-10-CM | POA: Insufficient documentation

## 2018-08-30 DIAGNOSIS — E559 Vitamin D deficiency, unspecified: Secondary | ICD-10-CM | POA: Insufficient documentation

## 2018-08-30 DIAGNOSIS — Z0000 Encounter for general adult medical examination without abnormal findings: Secondary | ICD-10-CM

## 2018-08-30 DIAGNOSIS — K219 Gastro-esophageal reflux disease without esophagitis: Secondary | ICD-10-CM

## 2018-08-30 DIAGNOSIS — Z8249 Family history of ischemic heart disease and other diseases of the circulatory system: Secondary | ICD-10-CM

## 2018-08-30 DIAGNOSIS — E282 Polycystic ovarian syndrome: Secondary | ICD-10-CM

## 2018-08-30 DIAGNOSIS — E1169 Type 2 diabetes mellitus with other specified complication: Secondary | ICD-10-CM

## 2018-08-30 NOTE — Patient Instructions (Addendum)
Plan to monitor BP at work - if consistently >130/80, we will need to adjust blood pressure medicines.

## 2018-08-30 NOTE — Progress Notes (Signed)
HPI: Kelli Miller is a 37 y.o. female who  has a past medical history of Antiphospholipid antibody syndrome (HCC) (2019), Chronic hypertension in pregnancy (12/29/2017), Diabetes (HCC), Hypothyroidism, PCOS (polycystic ovarian syndrome) (2015), and Trichomoniasis (05/30/2018).  she presents to [facility] today, 08/30/18,  for chief complaint of: New to establish care, see headings below  History of diabetes Currently doing well on metformin, last A1c was 4.83 months ago, she states at that point she was on insulin and then reduced to metformin 1000 mg bid  High blood pressure Currently on Norvasc 10 mg daily, tolerating this medicine well.  No chest pain, pressure, shortness of breath  Hypothyroid Last TSH 07/21/2018 was 0.88, doing well on Synthroid 50 mcg daily  History of antiphospholipid antibody syndrome  Found in the process of work-up for multiple miscarriages.  She is currently taking aspirin 81 mg daily and is reluctant to discontinue this  Psoriasis Currently on Stelara.   PCOS Not particularly bothersome for her.  She has a Mirena in place.  Vitamin D deficiency Would like labs rechecked, she is taking supplementation  B12 deficiency Would like labs rechecked, she is taking supplementation    Past medical, surgical, social and family history reviewed:  Patient Active Problem List   Diagnosis Date Noted  . PCOS (polycystic ovarian syndrome) 01/13/2018  . GERD (gastroesophageal reflux disease) 01/13/2018  . Psoriasis 01/13/2018  . Elevated TSH 01/16/2016    Past Surgical History:  Procedure Laterality Date  . CESAREAN SECTION N/A 06/23/2018   Procedure: CESAREAN SECTION;  Surgeon: Tereso Newcomer, MD;  Location: [facility];  Service: Obstetrics;  Laterality: N/A;  . DILATION AND EVACUATION N/A 04/05/2014   Procedure: DILATATION AND EVACUATION with genetic studies;  Surgeon: Mitchel Honour, DO;  Location: [facility];  Service:  Gynecology;  Laterality: N/A;    Social History   Tobacco Use  . Smoking status: Former Smoker    Types: Cigarettes    Last attempt to quit: 10/03/2013    Years since quitting: 4.9  . Smokeless tobacco: Never Used  Substance Use Topics  . Alcohol use: No    Family History  Problem Relation Age of Onset  . Lung cancer Father   . Lung cancer Mother   . Heart attack Mother   . Hypertension Mother      Current medication list and allergy/intolerance information reviewed:    Current Outpatient Medications  Medication Sig Dispense Refill  . amLODipine (NORVASC) 10 MG tablet Take 1 tablet (10 mg total) by mouth daily. 30 tablet 0  . aspirin 81 MG chewable tablet Chew by mouth daily.    . Cholecalciferol (VITAMIN D) 2000 UNITS CAPS Take 2,000 Units by mouth daily.    . Clobetasol Propionate (CLOBETASOL 17 PROPIONATE) POWD Apply prn 15 g 12  . diphenhydrAMINE (BENADRYL) 50 MG capsule Take 50 mg by mouth at bedtime as needed for sleep.    Marland Kitchen ibuprofen (ADVIL,MOTRIN) 600 MG tablet Take 1 tablet (600 mg total) by mouth every 6 (six) hours. 30 tablet 0  . Iron, Ferrous Gluconate, 256 (28 Fe) MG TABS 1 tablet BID 60 tablet 3  . levonorgestrel (MIRENA) 20 MCG/24HR IUD 1 each by Intrauterine route once.    Marland Kitchen levothyroxine (SYNTHROID, LEVOTHROID) 50 MCG tablet Take 1 tablet (50 mcg total) by mouth daily before breakfast. 30 tablet 3  . Melatonin 3 MG CAPS Take 3 mg by mouth at bedtime as needed (sleep).    . metFORMIN (  GLUCOPHAGE) 1000 MG tablet Take 1,000 mg by mouth 2 (two) times daily with a meal.    . Prenatal Vit-Fe Fumarate-FA (PRENATAL MULTIVITAMIN) TABS tablet Take 1 tablet by mouth daily at 12 noon.    . STELARA 90 MG/ML SOSY injection     . vitamin B-12 (CYANOCOBALAMIN) 100 MCG tablet Take 100 mcg by mouth daily.     No current facility-administered medications for this visit.     Allergies  Allergen Reactions  . Bactrim [Sulfamethoxazole-Trimethoprim] Rash      Review of  Systems:  Constitutional:  No  fever, no chills, No recent illness, No unintentional weight changes. No significant fatigue.   HEENT: No  headache, no vision change, no hearing change, No sore throat, No  sinus pressure  Cardiac: No  chest pain, No  pressure, No palpitations, No  Orthopnea  Respiratory:  No  shortness of breath. No  Cough  Gastrointestinal: No  abdominal pain, No  nausea, No  vomiting,  No  blood in stool, No  diarrhea, No  constipation   Musculoskeletal: No new myalgia/arthralgia  Skin: No  Rash, No other wounds/concerning lesions  Genitourinary: No  incontinence, No  abnormal genital bleeding, No abnormal genital discharge  Hem/Onc: No  easy bruising/bleeding, No  abnormal lymph node  Endocrine: No cold intolerance,  No heat intolerance. No polyuria/polydipsia/polyphagia   Neurologic: No  weakness, No  dizziness, No  slurred speech/focal weakness/facial droop  Psychiatric: No  concerns with depression, No  concerns with anxiety, No sleep problems, No mood problems  Exam:  BP 137/68 (BP Location: Left Arm, Patient Position: Sitting, Cuff Size: Normal)   Pulse 79   Temp 98.3 F (36.8 C) (Oral)   Ht 5\' 1"  (1.549 m)   Wt 221 lb (100.2 kg)   LMP 10/04/2017   BMI 41.76 kg/m   Constitutional: VS see above. General Appearance: alert, well-developed, well-nourished, NAD  Eyes: Normal lids and conjunctive, non-icteric sclera  Ears, Nose, Mouth, Throat: MMM, Normal external inspection ears/nares/mouth/lips/gums. TM normal bilaterally. Pharynx/tonsils no erythema, no exudate. Nasal mucosa normal.   Neck: No masses, trachea midline. No thyroid enlargement. No tenderness/mass appreciated. No lymphadenopathy  Respiratory: Normal respiratory effort. no wheeze, no rhonchi, no rales  Cardiovascular: S1/S2 normal, no murmur, no rub/gallop auscultated. RRR. No lower extremity edema. Pedal pulse II/IV bilaterally DP and PT. No carotid bruit or JVD. No abdominal aortic  bruit.  Gastrointestinal: Nontender, no masses. No hepatomegaly, no splenomegaly. No hernia appreciated. Bowel sounds normal. Rectal exam deferred.   Musculoskeletal: Gait normal. No clubbing/cyanosis of digits.   Neurological: Normal balance/coordination. No tremor. No cranial nerve deficit on limited exam. Motor and sensation intact and symmetric. Cerebellar reflexes intact.   Skin: warm, dry, intact. No rash/ulcer. No concerning nevi or subq nodules on limited exam.    Psychiatric: Normal judgment/insight. Normal mood and affect. Oriented x3.      ASSESSMENT/PLAN:   The primary encounter diagnosis was Encounter for medical examination to establish care. Diagnoses of Type 2 diabetes mellitus with other specified complication, without long-term current use of insulin (HCC), Hypothyroidism, unspecified type, PCOS (polycystic ovarian syndrome), Psoriasis, B12 deficiency, Vitamin D deficiency, Gastroesophageal reflux disease, esophagitis presence not specified, History of antiphospholipid syndrome, and Family history of premature CAD were also pertinent to this visit.   Orders Placed This Encounter  Procedures  . CBC  . COMPLETE METABOLIC PANEL WITH GFR  . Lipid panel  . Hemoglobin A1c  . Vitamin B12  . VITAMIN D 25  Hydroxy (Vit-D Deficiency, Fractures)       Patient Instructions  Plan to monitor BP at work - if consistently >130/80, we will need to adjust blood pressure medicines.     Visit summary with medication list and pertinent instructions was printed for patient to review. All questions at time of visit were answered - patient instructed to contact office with any additional concerns. ER/RTC precautions were reviewed with the patient.   Follow-up plan: Return in about 3 months (around 11/30/2018) for monitor A1C, see me sooner if needed! .     Please note: voice recognition software was used to produce this document, and typos may escape review. Please contact Dr.  Lyn Hollingshead for any needed clarifications.

## 2018-09-21 ENCOUNTER — Encounter: Payer: Self-pay | Admitting: Osteopathic Medicine

## 2018-09-21 MED ORDER — AMLODIPINE BESYLATE 10 MG PO TABS: 10.0000 mg | ORAL_TABLET | Freq: Every day | ORAL | 0 refills | Status: DC

## 2018-10-14 ENCOUNTER — Telehealth: Payer: Self-pay

## 2018-10-14 MED ORDER — PRENATAL MULTIVITAMIN CH: 1.0000 | ORAL_TABLET | Freq: Every day | ORAL | 3 refills | Status: DC

## 2018-10-14 NOTE — Telephone Encounter (Signed)
[facility] requesting med refill for preplus prenatal supplement. Written by historical provider.

## 2018-10-22 ENCOUNTER — Encounter: Payer: Self-pay | Admitting: Osteopathic Medicine

## 2018-10-24 MED ORDER — METFORMIN HCL 1000 MG PO TABS: 1000.0000 mg | ORAL_TABLET | Freq: Two times a day (BID) | ORAL | 0 refills | Status: DC

## 2018-11-30 ENCOUNTER — Ambulatory Visit: Payer: PRIVATE HEALTH INSURANCE | Admitting: Osteopathic Medicine

## 2018-12-25 ENCOUNTER — Other Ambulatory Visit: Payer: Self-pay | Admitting: Osteopathic Medicine

## 2019-01-30 ENCOUNTER — Encounter: Payer: Self-pay | Admitting: Osteopathic Medicine

## 2019-01-30 MED ORDER — LEVOTHYROXINE SODIUM 50 MCG PO TABS: 50.0000 ug | ORAL_TABLET | Freq: Every day | ORAL | 1 refills | Status: DC

## 2019-03-25 ENCOUNTER — Other Ambulatory Visit: Payer: Self-pay | Admitting: Osteopathic Medicine

## 2019-03-28 ENCOUNTER — Other Ambulatory Visit: Payer: Self-pay

## 2019-03-28 MED ORDER — METFORMIN HCL 1000 MG PO TABS: 1000.0000 mg | ORAL_TABLET | Freq: Two times a day (BID) | ORAL | 0 refills | Status: DC

## 2019-04-30 ENCOUNTER — Other Ambulatory Visit: Payer: Self-pay | Admitting: Osteopathic Medicine

## 2019-05-31 ENCOUNTER — Encounter: Payer: Self-pay | Admitting: Osteopathic Medicine

## 2019-05-31 ENCOUNTER — Telehealth (INDEPENDENT_AMBULATORY_CARE_PROVIDER_SITE_OTHER): Payer: PRIVATE HEALTH INSURANCE | Admitting: Osteopathic Medicine

## 2019-05-31 DIAGNOSIS — E538 Deficiency of other specified B group vitamins: Secondary | ICD-10-CM

## 2019-05-31 DIAGNOSIS — E039 Hypothyroidism, unspecified: Secondary | ICD-10-CM

## 2019-05-31 DIAGNOSIS — E282 Polycystic ovarian syndrome: Secondary | ICD-10-CM | POA: Diagnosis not present

## 2019-05-31 DIAGNOSIS — E559 Vitamin D deficiency, unspecified: Secondary | ICD-10-CM

## 2019-05-31 MED ORDER — OMEPRAZOLE 20 MG PO CPDR: 20.0000 mg | DELAYED_RELEASE_CAPSULE | Freq: Every day | ORAL | 3 refills | Status: DC

## 2019-05-31 MED ORDER — AMLODIPINE BESYLATE 10 MG PO TABS: 10.0000 mg | ORAL_TABLET | Freq: Every day | ORAL | 1 refills | Status: DC

## 2019-05-31 NOTE — Progress Notes (Signed)
Virtual Visit via Video (App used: MyChart) Note  I connected with      Kelli Miller on 05/31/19 at 3:00 PM by a telemedicine application and verified that I am speaking with the correct person using two identifiers.  Patient is at home I am in office   I discussed the limitations of evaluation and management by telemedicine and the availability of in person appointments. The patient expressed understanding and agreed to proceed.  History of Present Illness: Kelli Miller is a 38 y.o. female who would like to discuss medication follow-up    Needs refills on medications  BP has been ok Labs orderd awhile ago, hasn't gotten these done yet    Observations/Objective: There were no vitals taken for this visit. BP Readings from Last 3 Encounters:  08/30/18 137/68  08/29/18 140/86  08/01/18 138/78   Exam: Normal Speech.  NAD  Lab and Radiology Results No results found for this or any previous visit (from the past 72 hour(s)). No results found.     Assessment and Plan: 38 y.o. female with The primary encounter diagnosis was Hypothyroidism, unspecified type. Diagnoses of B12 deficiency, Vitamin D deficiency, and PCOS (polycystic ovarian syndrome) were also pertinent to this visit.  Labs ordered last year, patient needs to get these done!!!   PDMP not reviewed this encounter. No orders of the defined types were placed in this encounter.  Meds ordered this encounter  Medications  . amLODipine (NORVASC) 10 MG tablet    Sig: Take 1 tablet (10 mg total) by mouth daily.    Dispense:  90 tablet    Refill:  1  . omeprazole (PRILOSEC) 20 MG capsule    Sig: Take 1 capsule (20 mg total) by mouth daily.    Dispense:  90 capsule    Refill:  3   There are no Patient Instructions on file for this visit.  Instructions sent via MyChart. If MyChart not available, pt was given option for info via personal e-mail w/ no guarantee of protected health info over unsecured e-mail  communication, and MyChart sign-up instructions were included.   Follow Up Instructions: Return in about 6 weeks (around 07/12/2019) for [facility] (labs prior to visit, orders are in) - can be virtual .    I discussed the assessment and treatment plan with the patient. The patient was provided an opportunity to ask questions and all were answered. The patient agreed with the plan and demonstrated an understanding of the instructions.   The patient was advised to call back or seek an in-person evaluation if any new concerns, if symptoms worsen or if the condition fails to improve as anticipated.  15 minutes of non-face-to-face time was provided during this encounter.                      Historical information moved to improve visibility of documentation.  Past Medical History:  Diagnosis Date  . Antiphospholipid antibody syndrome ([place]) 2019  . Chronic hypertension in pregnancy 12/29/2017  . Diabetes ([place])    Type 2  . Hypothyroidism   . PCOS (polycystic ovarian syndrome) 2015  . Trichomoniasis 05/30/2018   TOC positive; retreat in early August.>> Done, TOC 8/28 negative   Past Surgical History:  Procedure Laterality Date  . CESAREAN SECTION N/A 06/23/2018   Procedure: CESAREAN SECTION;  Surgeon: Osborne Oman, MD;  Location: [place];  Service: Obstetrics;  Laterality: N/A;  . DILATION AND EVACUATION N/A 04/05/2014  Procedure: DILATATION AND EVACUATION with genetic studies;  Surgeon: Mitchel HonourMegan Morris, DO;  Location: [facility];  Service: Gynecology;  Laterality: N/A;   Social History   Tobacco Use  . Smoking status: Former Smoker    Types: Cigarettes    Quit date: 10/03/2013    Years since quitting: 5.6  . Smokeless tobacco: Never Used  Substance Use Topics  . Alcohol use: No   family history includes Heart attack in her mother; Hypertension in her mother; Lung cancer in her father and mother.  Medications: Current Outpatient Medications  Medication Sig  Dispense Refill  . Clobetasol Prop Emollient Base 0.05 % emollient cream APPLY EXTERNALLY TO THE AFFECTED AREA DAILY    . amLODipine (NORVASC) 10 MG tablet TAKE 1 TABLET(10 MG) BY MOUTH DAILY. FOLLOW UP VISIT WITH PCP (Patient not taking: Reported on 05/31/2019) 15 tablet 0  . aspirin 81 MG chewable tablet Chew by mouth daily.    . Cholecalciferol (VITAMIN D) 2000 UNITS CAPS Take 2,000 Units by mouth daily.    . Clobetasol Propionate (CLOBETASOL 17 PROPIONATE) POWD Apply prn (Patient not taking: Reported on 05/31/2019) 15 g 12  . diphenhydrAMINE (BENADRYL) 50 MG capsule Take 50 mg by mouth at bedtime as needed for sleep.    Marland Kitchen. ibuprofen (ADVIL,MOTRIN) 600 MG tablet Take 1 tablet (600 mg total) by mouth every 6 (six) hours. (Patient not taking: Reported on 05/31/2019) 30 tablet 0  . Iron, Ferrous Gluconate, 256 (28 Fe) MG TABS 1 tablet BID (Patient not taking: Reported on 05/31/2019) 60 tablet 3  . levonorgestrel (MIRENA) 20 MCG/24HR IUD 1 each by Intrauterine route once.    Marland Kitchen. levothyroxine (SYNTHROID, LEVOTHROID) 50 MCG tablet Take 1 tablet (50 mcg total) by mouth daily before breakfast. (Patient not taking: Reported on 05/31/2019) 90 tablet 1  . Melatonin 3 MG CAPS Take 3 mg by mouth at bedtime as needed (sleep).    . metFORMIN (GLUCOPHAGE) 1000 MG tablet Take 1 tablet (1,000 mg total) by mouth 2 (two) times daily with a meal. (Patient not taking: Reported on 05/31/2019) 180 tablet 0  . Prenatal Vit-Fe Fumarate-FA (PRENATAL MULTIVITAMIN) TABS tablet Take 1 tablet by mouth daily. (Patient not taking: Reported on 05/31/2019) 90 tablet 3  . STELARA 90 MG/ML SOSY injection     . vitamin B-12 (CYANOCOBALAMIN) 100 MCG tablet Take 100 mcg by mouth daily.     No current facility-administered medications for this visit.    Allergies  Allergen Reactions  . Bactrim [Sulfamethoxazole-Trimethoprim] Rash    PDMP not reviewed this encounter. No orders of the defined types were placed in this encounter.  No orders of  the defined types were placed in this encounter.

## 2019-06-06 ENCOUNTER — Ambulatory Visit (INDEPENDENT_AMBULATORY_CARE_PROVIDER_SITE_OTHER): Payer: PRIVATE HEALTH INSURANCE | Admitting: Osteopathic Medicine

## 2019-06-06 ENCOUNTER — Encounter: Payer: Self-pay | Admitting: Osteopathic Medicine

## 2019-06-06 ENCOUNTER — Other Ambulatory Visit: Payer: Self-pay

## 2019-06-06 ENCOUNTER — Other Ambulatory Visit: Payer: Self-pay | Admitting: Osteopathic Medicine

## 2019-06-06 VITALS — BP 147/78 | HR 90 | Temp 98.6°F | Wt 241.6 lb

## 2019-06-06 DIAGNOSIS — Z975 Presence of (intrauterine) contraceptive device: Secondary | ICD-10-CM | POA: Diagnosis not present

## 2019-06-06 DIAGNOSIS — D72829 Elevated white blood cell count, unspecified: Secondary | ICD-10-CM

## 2019-06-06 DIAGNOSIS — Z538 Procedure and treatment not carried out for other reasons: Secondary | ICD-10-CM | POA: Diagnosis not present

## 2019-06-06 LAB — COMPLETE METABOLIC PANEL WITH GFR
AG Ratio: 1.6 (calc) (ref 1.0–2.5)
ALT: 18 U/L (ref 6–29)
AST: 13 U/L (ref 10–30)
Albumin: 4.1 g/dL (ref 3.6–5.1)
Alkaline phosphatase (APISO): 50 U/L (ref 31–125)
BUN: 11 mg/dL (ref 7–25)
CO2: 24 mmol/L (ref 20–32)
Calcium: 9 mg/dL (ref 8.6–10.2)
Chloride: 106 mmol/L (ref 98–110)
Creat: 0.53 mg/dL (ref 0.50–1.10)
GFR, Est African American: 138 mL/min/{1.73_m2} (ref >=60)
GFR, Est Non African American: 119 mL/min/{1.73_m2} (ref >=60)
Globulin: 2.5 g/dL (calc) (ref 1.9–3.7)
Glucose, Bld: 128 mg/dL — ABNORMAL HIGH (ref 65–99)
Potassium: 3.9 mmol/L (ref 3.5–5.3)
Sodium: 137 mmol/L (ref 135–146)
Total Bilirubin: 0.4 mg/dL (ref 0.2–1.2)
Total Protein: 6.6 g/dL (ref 6.1–8.1)

## 2019-06-06 LAB — CBC
HCT: 38.7 % (ref 35.0–45.0)
Hemoglobin: 13.5 g/dL (ref 11.7–15.5)
MCH: 31.2 pg (ref 27.0–33.0)
MCHC: 34.9 g/dL (ref 32.0–36.0)
MCV: 89.4 fL (ref 80.0–100.0)
MPV: 10.2 fL (ref 7.5–12.5)
Platelets: 317 10*3/uL (ref 140–400)
RBC: 4.33 10*6/uL (ref 3.80–5.10)
RDW: 13.2 % (ref 11.0–15.0)
WBC: 12.1 10*3/uL — ABNORMAL HIGH (ref 3.8–10.8)

## 2019-06-06 LAB — LIPID PANEL
Cholesterol: 165 mg/dL (ref <200)
HDL: 42 mg/dL — ABNORMAL LOW (ref >=50)
LDL Cholesterol (Calc): 101 mg/dL (calc) — ABNORMAL HIGH
Non-HDL Cholesterol (Calc): 123 mg/dL (calc) (ref <130)
Total CHOL/HDL Ratio: 3.9 (calc) (ref <5.0)
Triglycerides: 121 mg/dL (ref <150)

## 2019-06-06 LAB — HEMOGLOBIN A1C
Hgb A1c MFr Bld: 6.3 % of total Hgb — ABNORMAL HIGH (ref <5.7)
Mean Plasma Glucose: 134 (calc)
eAG (mmol/L): 7.4 (calc)

## 2019-06-06 LAB — VITAMIN D 25 HYDROXY (VIT D DEFICIENCY, FRACTURES): Vit D, 25-Hydroxy: 27 ng/mL — ABNORMAL LOW (ref 30–100)

## 2019-06-06 LAB — VITAMIN B12: Vitamin B-12: 325 pg/mL (ref 200–1100)

## 2019-06-06 NOTE — Progress Notes (Signed)
HPI: Kelli Miller is a 38 y.o. female who  has a past medical history of Antiphospholipid antibody syndrome ([place]) (2019), Chronic hypertension in pregnancy (12/29/2017), Diabetes ([place]), Hypothyroidism, PCOS (polycystic ovarian syndrome) (2015), and Trichomoniasis (05/30/2018).  she presents to [facility] today, 06/06/19,  for chief complaint of:  IUD removal  Desires removal IUD, no alternate contraceptive plans at the moment.     At today's visit 06/06/19 ... PMH, PSH, FH reviewed and updated as needed.  Current medication list and allergy/intolerance hx reviewed and updated as needed. (See remainder of HPI, ROS, Phys Exam below)   No results found.  Results for orders placed or performed in visit on 08/30/18 (from the past 72 hour(s))  CBC     Status: Abnormal   Collection Time: 06/05/19 12:23 PM  Result Value Ref Range   WBC 12.1 (H) 3.8 - 10.8 Thousand/uL   RBC 4.33 3.80 - 5.10 Million/uL   Hemoglobin 13.5 11.7 - 15.5 g/dL   HCT 38.7 35.0 - 45.0 %   MCV 89.4 80.0 - 100.0 fL   MCH 31.2 27.0 - 33.0 pg   MCHC 34.9 32.0 - 36.0 g/dL   RDW 13.2 11.0 - 15.0 %   Platelets 317 140 - 400 Thousand/uL   MPV 10.2 7.5 - 12.5 fL  COMPLETE METABOLIC PANEL WITH GFR     Status: Abnormal   Collection Time: 06/05/19 12:23 PM  Result Value Ref Range   Glucose, Bld 128 (H) 65 - 99 mg/dL    Comment: .            Fasting reference interval . For someone without known diabetes, a glucose value >125 mg/dL indicates that they may have diabetes and this should be confirmed with a follow-up test. .    BUN 11 7 - 25 mg/dL   Creat 0.53 0.50 - 1.10 mg/dL   GFR, Est Non African American 119 > OR = 60 mL/min/1.7m2   GFR, Est African American 138 > OR = 60 mL/min/1.53m2   BUN/Creatinine Ratio NOT APPLICABLE 6 - 22 (calc)   Sodium 137 135 - 146 mmol/L   Potassium 3.9 3.5 - 5.3 mmol/L   Chloride 106 98 - 110 mmol/L   CO2 24 20 - 32 mmol/L   Calcium 9.0 8.6 -  10.2 mg/dL   Total Protein 6.6 6.1 - 8.1 g/dL   Albumin 4.1 3.6 - 5.1 g/dL   Globulin 2.5 1.9 - 3.7 g/dL (calc)   AG Ratio 1.6 1.0 - 2.5 (calc)   Total Bilirubin 0.4 0.2 - 1.2 mg/dL   Alkaline phosphatase (APISO) 50 31 - 125 U/L   AST 13 10 - 30 U/L   ALT 18 6 - 29 U/L  Lipid panel     Status: Abnormal   Collection Time: 06/05/19 12:23 PM  Result Value Ref Range   Cholesterol 165 <200 mg/dL   HDL 42 (L) > OR = 50 mg/dL   Triglycerides 121 <150 mg/dL   LDL Cholesterol (Calc) 101 (H) mg/dL (calc)    Comment: Reference range: <100 . Desirable range <100 mg/dL for primary prevention;   <70 mg/dL for patients with CHD or diabetic patients  with > or = 2 CHD risk factors. Marland Kitchen LDL-C is now calculated using the Martin-Hopkins  calculation, which is a validated novel method providing  better accuracy than the Friedewald equation in the  estimation of LDL-C.  Cresenciano Genre et al. Annamaria Helling. 0263;785(88): 2061-2068  (http://education.[facility].com/faq/FAQ164)  Total CHOL/HDL Ratio 3.9 <5.0 (calc)   Non-HDL Cholesterol (Calc) 123 <130 mg/dL (calc)    Comment: For patients with diabetes plus 1 major ASCVD risk  factor, treating to a non-HDL-C goal of <100 mg/dL  (LDL-C of <11<70 mg/dL) is considered a therapeutic  option.   Hemoglobin A1c     Status: Abnormal   Collection Time: 06/05/19 12:23 PM  Result Value Ref Range   Hgb A1c MFr Bld 6.3 (H) <5.7 % of total Hgb    Comment: For someone without known diabetes, a hemoglobin  A1c value between 5.7% and 6.4% is consistent with prediabetes and should be confirmed with a  follow-up test. . For someone with known diabetes, a value <7% indicates that their diabetes is well controlled. A1c targets should be individualized based on duration of diabetes, age, comorbid conditions, and other considerations. . This assay result is consistent with an increased risk of diabetes. . Currently, no consensus exists regarding use of hemoglobin A1c  for diagnosis of diabetes for children. .    Mean Plasma Glucose 134 (calc)   eAG (mmol/L) 7.4 (calc)  Vitamin B12     Status: None   Collection Time: 06/05/19 12:23 PM  Result Value Ref Range   Vitamin B-12 325 200 - 1,100 pg/mL    Comment: . Please Note: Although the reference range for vitamin B12 is [phone] pg/mL, it has been reported that between 5 and 10% of patients with values between 200 and 400 pg/mL may experience neuropsychiatric and hematologic abnormalities due to occult B12 deficiency; less than 1% of patients with values above 400 pg/mL will have symptoms. Marland Kitchen.   VITAMIN D 25 Hydroxy (Vit-D Deficiency, Fractures)     Status: Abnormal   Collection Time: 06/05/19 12:23 PM  Result Value Ref Range   Vit D, 25-Hydroxy 27 (L) 30 - 100 ng/mL    Comment: Vitamin D Status         25-OH Vitamin D: . Deficiency:                    <20 ng/mL Insufficiency:             20 - 29 ng/mL Optimal:                 > or = 30 ng/mL . For 25-OH Vitamin D testing on patients on  D2-supplementation and patients for whom quantitation  of D2 and D3 fractions is required, the [employer]ureD(TM) 25-OH VIT D, (D2,D3), LC/MS/MS is recommended: order  code [id number] (patients >9640yrs). See Note 1 . Note 1 . For additional information, please refer to  http://education.[facility].com/faq/FAQ199  (This link is being provided for informational/ educational purposes only.)           ASSESSMENT/PLAN: The encounter diagnosis was Attempted IUD removal, unsuccessful.   Unable to visualize strings or coax strings from the os with cytobrush. Advised f/u OBGYN!     Follow-up plan: Return for RECHECK PENDING RESULTS (following up on elevated WBC)  .                                                 ################################################# ################################################# ################################################# #################################################    Current Meds  Medication Sig  . amLODipine (NORVASC) 10 MG tablet Take 1 tablet (10 mg total) by mouth daily.  . Cholecalciferol (VITAMIN D) 2000  UNITS CAPS Take 2,000 Units by mouth daily.  . Clobetasol Prop Emollient Base 0.05 % emollient cream APPLY EXTERNALLY TO THE AFFECTED AREA DAILY  . diphenhydrAMINE (BENADRYL) 50 MG capsule Take 50 mg by mouth at bedtime as needed for sleep.  . Iron, Ferrous Gluconate, 256 (28 Fe) MG TABS 1 tablet BID  . levonorgestrel (MIRENA) 20 MCG/24HR IUD 1 each by Intrauterine route once.  Marland Kitchen. levothyroxine (SYNTHROID, LEVOTHROID) 50 MCG tablet Take 1 tablet (50 mcg total) by mouth daily before breakfast.  . Melatonin 3 MG CAPS Take 3 mg by mouth at bedtime as needed (sleep).  . metFORMIN (GLUCOPHAGE) 1000 MG tablet Take 1 tablet (1,000 mg total) by mouth 2 (two) times daily with a meal.  . omeprazole (PRILOSEC) 20 MG capsule Take 1 capsule (20 mg total) by mouth daily.  Marcy Panning. STELARA 90 MG/ML SOSY injection     Allergies  Allergen Reactions  . Bactrim [Sulfamethoxazole-Trimethoprim] Rash       Review of Systems:  Constitutional: No recent illness  Gastrointestinal: No  abdominal pain  Skin: No  Rash   Exam:  BP (!) 151/87 (BP Location: Left Arm, Patient Position: Sitting, Cuff Size: Large)   Pulse (!) 103   Temp 98.6 F (37 C) (Oral)   Wt 241 lb 9.6 oz (109.6 kg)   BMI 45.65 kg/m  GYN: No lesions/ulcers to external genitalia, normal urethra, normal vaginal mucosa, physiologic discharge, cervix normal without lesions, no IUD strings visible     Visit summary with medication list and pertinent instructions was printed for patient to review,  patient was advised to alert us if any updates are needed. All questions at time of visit were answered - patient instructed to contact office with any additional concerns. ER/RTC precautions were reviewed with the patient and understanding verbalized.   Note: Total time spent 5 minutes, greater than 50% of the visit was spent face-to-face counseling and coordinating care for the following: The encounter diagnosis was Attempted IUD removal, unsuccessful..  Please note: voice recognition software was used to produce this document, and typos may escape review. Please contact Dr. Lyn HollingsheadAlexander for any needed clarifications.    Follow up plan: Return for RECHECK PENDING RESULTS (following up on elevated WBC) .

## 2019-06-06 NOTE — Progress Notes (Signed)
Please call lab: I have added on CBC/differential with pathology review.  MyChart message sent: Few abnormalities to discuss on labs. Vitamin D was slightly below normal, would recommend 1000 to 2000 units supplementation daily. Sugars are in prediabetic range, A1c is 6.3.  We should follow-up on this level in another 3 months, can schedule office visit for this. Cholesterol is borderline elevated but nothing we need to start a medication for. White blood cells are a bit above normal but have been stable in that range, I have added on additional testing just to double check and make sure everything looks okay under the microscope.  Will let you know when I have these results back.

## 2019-06-30 ENCOUNTER — Encounter: Payer: Self-pay | Admitting: Osteopathic Medicine

## 2019-07-06 NOTE — Telephone Encounter (Signed)
Left VM for Pt to return clinic call schedule

## 2019-07-06 NOTE — Telephone Encounter (Signed)
Can we schedule patient for nurse visit blood pressure/weight check, patient would like to start phentermine, if blood pressure okay I can send a prescription.

## 2019-07-17 ENCOUNTER — Ambulatory Visit: Payer: PRIVATE HEALTH INSURANCE | Admitting: Obstetrics & Gynecology

## 2019-07-17 ENCOUNTER — Other Ambulatory Visit: Payer: Self-pay

## 2019-07-17 ENCOUNTER — Encounter: Payer: Self-pay | Admitting: Obstetrics & Gynecology

## 2019-07-17 VITALS — BP 156/93 | HR 115 | Ht 64.0 in | Wt 243.0 lb

## 2019-07-17 DIAGNOSIS — Z538 Procedure and treatment not carried out for other reasons: Secondary | ICD-10-CM

## 2019-07-17 DIAGNOSIS — Z30432 Encounter for removal of intrauterine contraceptive device: Secondary | ICD-10-CM | POA: Diagnosis not present

## 2019-07-17 NOTE — Progress Notes (Signed)
   Subjective:    Patient ID: Kelli Miller, female    DOB: 1981/07/29, 38 y.o.   MRN: [id number]  HPI 38 yo married P1 here to have her Mirena removed due to new onset headaches that she attributes to the [place]. Dr. Sheppard Coil tried to remove it but could not do so.   Review of Systems     Objective:   Physical Exam Breathing, conversing, and ambulating normally Well nourished, well hydrated White female, no apparent distress Abd- benign, obese I sprayed her cervix with [place] spray, prepped it with betadine and attempted to use the IUD hook. Her cervix was too stenotic for this.  A bedside u/s confirmed the proper placement of the IUD. I dilated her cervix with the os finder and then used the IUD hook without success. She found the procedure too painful to continue and she requested that it be removed in the OR with anesthesia.     Assessment & Plan:  Desire to have IUD removed I will send a message to [place] to schedule this.

## 2019-08-14 ENCOUNTER — Encounter (HOSPITAL_BASED_OUTPATIENT_CLINIC_OR_DEPARTMENT_OTHER): Payer: Self-pay | Admitting: *Deleted

## 2019-08-14 ENCOUNTER — Other Ambulatory Visit: Payer: Self-pay

## 2019-08-14 ENCOUNTER — Other Ambulatory Visit (HOSPITAL_COMMUNITY)
Admission: RE | Admit: 2019-08-14 | Discharge: 2019-08-14 | Disposition: A | Discharge status: Home / Self Care | Payer: PRIVATE HEALTH INSURANCE | Source: Ambulatory Visit | Attending: Obstetrics & Gynecology | Admitting: Obstetrics & Gynecology

## 2019-08-14 DIAGNOSIS — Z01812 Encounter for preprocedural laboratory examination: Secondary | ICD-10-CM | POA: Insufficient documentation

## 2019-08-14 DIAGNOSIS — Z20828 Contact with and (suspected) exposure to other viral communicable diseases: Secondary | ICD-10-CM | POA: Diagnosis not present

## 2019-08-14 LAB — SARS CORONAVIRUS 2 (TAT 6-24 HRS): SARS Coronavirus 2: NEGATIVE

## 2019-08-14 NOTE — Progress Notes (Signed)
Spoke w/ via phone for pre-op interview--- Pt Lab needs dos---- CBC, BMP, EKG, Urine preg COVID test ------ 08-14-2019 Arrive at ------- 0630 NPO after ------ [place] Medications to take morning of surgery ----- Synthroid Diabetic medication ----- do not take metformin morning of surgery Patient Special Instructions ----- n/a Pre-Op special Istructions ----- n/a Patient verbalized understanding of instructions that were given at this phone interview. Patient denies shortness of breath, chest pain, fever, cough a this phone interview.

## 2019-08-15 ENCOUNTER — Ambulatory Visit (HOSPITAL_BASED_OUTPATIENT_CLINIC_OR_DEPARTMENT_OTHER): Payer: PRIVATE HEALTH INSURANCE | Admitting: Anesthesiology

## 2019-08-15 ENCOUNTER — Other Ambulatory Visit: Payer: Self-pay

## 2019-08-15 ENCOUNTER — Encounter (HOSPITAL_BASED_OUTPATIENT_CLINIC_OR_DEPARTMENT_OTHER): Payer: Self-pay | Admitting: *Deleted

## 2019-08-15 ENCOUNTER — Ambulatory Visit (HOSPITAL_BASED_OUTPATIENT_CLINIC_OR_DEPARTMENT_OTHER)
Admission: RE | Admit: 2019-08-15 | Discharge: 2019-08-15 | Disposition: A | Discharge status: Home / Self Care | Payer: PRIVATE HEALTH INSURANCE | Attending: Obstetrics & Gynecology | Admitting: Obstetrics & Gynecology

## 2019-08-15 ENCOUNTER — Encounter (HOSPITAL_BASED_OUTPATIENT_CLINIC_OR_DEPARTMENT_OTHER)
Admission: RE | Disposition: A | Discharge status: Home / Self Care | Payer: Self-pay | Source: Home / Self Care | Attending: Obstetrics & Gynecology

## 2019-08-15 DIAGNOSIS — Z30432 Encounter for removal of intrauterine contraceptive device: Secondary | ICD-10-CM

## 2019-08-15 DIAGNOSIS — K219 Gastro-esophageal reflux disease without esophagitis: Secondary | ICD-10-CM | POA: Insufficient documentation

## 2019-08-15 DIAGNOSIS — I10 Essential (primary) hypertension: Secondary | ICD-10-CM | POA: Insufficient documentation

## 2019-08-15 DIAGNOSIS — Z882 Allergy status to sulfonamides status: Secondary | ICD-10-CM | POA: Diagnosis not present

## 2019-08-15 DIAGNOSIS — Z6841 Body Mass Index (BMI) 40.0 and over, adult: Secondary | ICD-10-CM | POA: Insufficient documentation

## 2019-08-15 DIAGNOSIS — E559 Vitamin D deficiency, unspecified: Secondary | ICD-10-CM | POA: Diagnosis not present

## 2019-08-15 DIAGNOSIS — E119 Type 2 diabetes mellitus without complications: Secondary | ICD-10-CM | POA: Diagnosis not present

## 2019-08-15 DIAGNOSIS — Z87891 Personal history of nicotine dependence: Secondary | ICD-10-CM | POA: Insufficient documentation

## 2019-08-15 DIAGNOSIS — L409 Psoriasis, unspecified: Secondary | ICD-10-CM | POA: Diagnosis not present

## 2019-08-15 DIAGNOSIS — E039 Hypothyroidism, unspecified: Secondary | ICD-10-CM | POA: Diagnosis not present

## 2019-08-15 DIAGNOSIS — Z79899 Other long term (current) drug therapy: Secondary | ICD-10-CM | POA: Insufficient documentation

## 2019-08-15 DIAGNOSIS — Z7984 Long term (current) use of oral hypoglycemic drugs: Secondary | ICD-10-CM | POA: Insufficient documentation

## 2019-08-15 DIAGNOSIS — Z79890 Hormone replacement therapy (postmenopausal): Secondary | ICD-10-CM | POA: Insufficient documentation

## 2019-08-15 HISTORY — PX: IUD REMOVAL: SHX5392

## 2019-08-15 HISTORY — DX: Type 2 diabetes mellitus without complications: E11.9

## 2019-08-15 HISTORY — DX: Psoriasis, unspecified: L40.9

## 2019-08-15 HISTORY — DX: Other mechanical complication of intrauterine contraceptive device, initial encounter: T83.39XA

## 2019-08-15 HISTORY — DX: Gastro-esophageal reflux disease without esophagitis: K21.9

## 2019-08-15 LAB — GLUCOSE, CAPILLARY
Glucose-Capillary: 179 mg/dL — ABNORMAL HIGH (ref 70–99)
Glucose-Capillary: 163 mg/dL — ABNORMAL HIGH (ref 70–99)

## 2019-08-15 LAB — POCT PREGNANCY, URINE: Preg Test, Ur: NEGATIVE

## 2019-08-15 SURGERY — REMOVAL, INTRAUTERINE DEVICE
Anesthesia: General

## 2019-08-15 MED ORDER — LIDOCAINE HCL (CARDIAC) PF 100 MG/5ML IV SOSY
PREFILLED_SYRINGE | INTRAVENOUS | Status: DC | PRN
  Administered 2019-08-15: 60 mg via INTRAVENOUS

## 2019-08-15 MED ORDER — IBUPROFEN 600 MG PO TABS: 600.0000 mg | ORAL_TABLET | Freq: Four times a day (QID) | ORAL | 1 refills | Status: DC | PRN

## 2019-08-15 MED ORDER — BUPIVACAINE HCL (PF) 0.5 % IJ SOLN
INTRAMUSCULAR | Status: DC | PRN
  Administered 2019-08-15: 30 mL

## 2019-08-15 MED ORDER — LACTATED RINGERS IV SOLN
INTRAVENOUS | Status: DC
  Administered 2019-08-15: 07:00:00 via INTRAVENOUS
  Filled 2019-08-15: qty 1000

## 2019-08-15 MED ORDER — PROPOFOL 10 MG/ML IV BOLUS
INTRAVENOUS | Status: AC
  Filled 2019-08-15: qty 20

## 2019-08-15 MED ORDER — DEXAMETHASONE SODIUM PHOSPHATE 4 MG/ML IJ SOLN
INTRAMUSCULAR | Status: DC | PRN
  Administered 2019-08-15: 10 mg via INTRAVENOUS

## 2019-08-15 MED ORDER — LIDOCAINE 2% (20 MG/ML) 5 ML SYRINGE
INTRAMUSCULAR | Status: AC
  Filled 2019-08-15: qty 5

## 2019-08-15 MED ORDER — ONDANSETRON HCL 4 MG/2ML IJ SOLN
INTRAMUSCULAR | Status: DC | PRN
  Administered 2019-08-15: 4 mg via INTRAVENOUS

## 2019-08-15 MED ORDER — MIDAZOLAM HCL 5 MG/5ML IJ SOLN
INTRAMUSCULAR | Status: DC | PRN
  Administered 2019-08-15: 2 mg via INTRAVENOUS

## 2019-08-15 MED ORDER — PROPOFOL 10 MG/ML IV BOLUS
INTRAVENOUS | Status: DC | PRN
  Administered 2019-08-15: 50 mg via INTRAVENOUS
  Administered 2019-08-15: 150 mg via INTRAVENOUS
  Administered 2019-08-15: 50 mg via INTRAVENOUS

## 2019-08-15 MED ORDER — GABAPENTIN 300 MG PO CAPS
300.0000 mg | ORAL_CAPSULE | ORAL | Status: AC
  Administered 2019-08-15: 300 mg via ORAL
  Filled 2019-08-15: qty 1

## 2019-08-15 MED ORDER — KETOROLAC TROMETHAMINE 30 MG/ML IJ SOLN
INTRAMUSCULAR | Status: DC | PRN
  Administered 2019-08-15: 30 mg via INTRAVENOUS

## 2019-08-15 MED ORDER — ONDANSETRON HCL 4 MG/2ML IJ SOLN
INTRAMUSCULAR | Status: AC
  Filled 2019-08-15: qty 2

## 2019-08-15 MED ORDER — FENTANYL CITRATE (PF) 100 MCG/2ML IJ SOLN
INTRAMUSCULAR | Status: AC
  Filled 2019-08-15: qty 2

## 2019-08-15 MED ORDER — GABAPENTIN 300 MG PO CAPS
ORAL_CAPSULE | ORAL | Status: AC
  Filled 2019-08-15: qty 1

## 2019-08-15 MED ORDER — SCOPOLAMINE 1 MG/3DAYS TD PT72
1.0000 | MEDICATED_PATCH | TRANSDERMAL | Status: DC
  Administered 2019-08-15: 1 via TRANSDERMAL
  Filled 2019-08-15: qty 1

## 2019-08-15 MED ORDER — MIDAZOLAM HCL 2 MG/2ML IJ SOLN
INTRAMUSCULAR | Status: AC
  Filled 2019-08-15: qty 2

## 2019-08-15 MED ORDER — FENTANYL CITRATE (PF) 100 MCG/2ML IJ SOLN
INTRAMUSCULAR | Status: DC | PRN
  Administered 2019-08-15: 50 ug via INTRAVENOUS
  Administered 2019-08-15 (×2): 25 ug via INTRAVENOUS

## 2019-08-15 MED ORDER — KETOROLAC TROMETHAMINE 30 MG/ML IJ SOLN
INTRAMUSCULAR | Status: AC
  Filled 2019-08-15: qty 1

## 2019-08-15 MED ORDER — LACTATED RINGERS IV SOLN
INTRAVENOUS | Status: DC
  Administered 2019-08-15 (×2): via INTRAVENOUS
  Filled 2019-08-15: qty 1000

## 2019-08-15 MED ORDER — SOD CITRATE-CITRIC ACID 500-334 MG/5ML PO SOLN
30.0000 mL | ORAL | Status: DC
  Filled 2019-08-15: qty 30

## 2019-08-15 MED ORDER — FENTANYL CITRATE (PF) 100 MCG/2ML IJ SOLN
25.0000 ug | INTRAMUSCULAR | Status: DC | PRN
  Filled 2019-08-15: qty 1

## 2019-08-15 MED ORDER — SCOPOLAMINE 1 MG/3DAYS TD PT72
MEDICATED_PATCH | TRANSDERMAL | Status: AC
  Filled 2019-08-15: qty 1

## 2019-08-15 MED ORDER — ACETAMINOPHEN 500 MG PO TABS
1000.0000 mg | ORAL_TABLET | ORAL | Status: AC
  Administered 2019-08-15: 1000 mg via ORAL
  Filled 2019-08-15: qty 2

## 2019-08-15 MED ORDER — ACETAMINOPHEN 500 MG PO TABS
ORAL_TABLET | ORAL | Status: AC
  Filled 2019-08-15: qty 2

## 2019-08-15 MED ORDER — DEXAMETHASONE SODIUM PHOSPHATE 10 MG/ML IJ SOLN
INTRAMUSCULAR | Status: AC
  Filled 2019-08-15: qty 1

## 2019-08-15 SURGICAL SUPPLY — 9 items
DILATOR CANAL MILEX (MISCELLANEOUS) IMPLANT
GLOVE BIO SURGEON STRL SZ 6.5 (GLOVE) ×2 IMPLANT
GLOVE BIOGEL PI IND STRL 7.0 (GLOVE) ×1 IMPLANT
GLOVE BIOGEL PI INDICATOR 7.0 (GLOVE) ×1
GOWN STRL REUS W/TWL LRG LVL3 (GOWN DISPOSABLE) ×4 IMPLANT
PACK VAGINAL MINOR WOMEN LF (CUSTOM PROCEDURE TRAY) ×2 IMPLANT
PAD OB MATERNITY 4.3X12.25 (PERSONAL CARE ITEMS) ×2 IMPLANT
PAD PREP 24X48 CUFFED NSTRL (MISCELLANEOUS) ×2 IMPLANT
TOWEL OR 17X26 10 PK STRL BLUE (TOWEL DISPOSABLE) ×3 IMPLANT

## 2019-08-15 NOTE — H&P (Signed)
Kelli Miller is an 38 y.o. married P1 (1 yo daughter) here to have her Mirena removed due to new onset headaches that she attributes to the [place]. Dr. Lyn Hollingshead and I both tried to remove it but could not do so  She plans to use condoms for contraception.   No LMP recorded. (Menstrual status: IUD).    Past Medical History:  Diagnosis Date  . Antiphospholipid antibody syndrome (HCC)    per pt dx when had hx recurrent miscarriage's  . GERD (gastroesophageal reflux disease)   . History of trichomoniasis 2019  . Hypertension    followed by pcp  . Hypothyroidism    followed by   . PCOS (polycystic ovarian syndrome) 2015  . Psoriasis    dermatologist--- dr [facility])  . Retained intrauterine contraceptive device (IUD)   . Type 2 diabetes mellitus (HCC)    followed by pcp    Past Surgical History:  Procedure Laterality Date  . CESAREAN SECTION N/A 06/23/2018   Procedure: CESAREAN SECTION;  Surgeon: Tereso Newcomer, MD;  Location: [facility];  Service: Obstetrics;  Laterality: N/A;  . DILATION AND EVACUATION N/A 04/05/2014   Procedure: DILATATION AND EVACUATION with genetic studies;  Surgeon: Mitchel Honour, DO;  Location: [facility] ORS;  Service: Gynecology;  Laterality: N/A;    Family History  Problem Relation Age of Onset  . Lung cancer Father   . Lung cancer Mother   . Heart attack Mother   . Hypertension Mother     Social History:  reports that she quit smoking about 5 years ago. Her smoking use included cigarettes. She quit after 10.00 years of use. She has never used smokeless tobacco. She reports that she does not drink alcohol or use drugs.  Allergies:  Allergies  Allergen Reactions  . Bactrim [Sulfamethoxazole-Trimethoprim] Rash    Medications Prior to Admission  Medication Sig Dispense Refill Last Dose  . amLODipine (NORVASC) 10 MG tablet Take 1 tablet (10 mg total) by mouth daily. (Patient taking differently: Take 10 mg by mouth at bedtime. ) 90 tablet 1    . Cholecalciferol (VITAMIN D) 2000 UNITS CAPS Take 2,000 Units by mouth daily.     . Clobetasol Prop Emollient Base 0.05 % emollient cream 2 (two) times daily as needed.      Marland Kitchen ibuprofen (ADVIL) 200 MG tablet Take 200 mg by mouth every 6 (six) hours as needed.   Past Week at Unknown time  . levothyroxine (SYNTHROID, LEVOTHROID) 50 MCG tablet Take 1 tablet (50 mcg total) by mouth daily before breakfast. (Patient taking differently: Take 50 mcg by mouth daily before breakfast. ) 90 tablet 1   . Melatonin 3 MG CAPS Take 3 mg by mouth at bedtime as needed (sleep).     . metFORMIN (GLUCOPHAGE) 1000 MG tablet Take 1 tablet (1,000 mg total) by mouth 2 (two) times daily with a meal. 180 tablet 0 Past Week at Unknown time  . omeprazole (PRILOSEC) 20 MG capsule Take 1 capsule (20 mg total) by mouth daily. (Patient taking differently: Take 20 mg by mouth at bedtime. ) 90 capsule 3   . STELARA 90 MG/ML SOSY injection EVERY 12 WEEKS   08/14/2019 at Unknown time  . levonorgestrel (MIRENA) 20 MCG/24HR IUD 1 each by Intrauterine route once.       ROS  She is a Engineer, civil (consulting) at [employer].  Height 5\' 1"  (1.549 m), weight 109.8 kg, not currently breastfeeding. Physical Exam Breathing, conversing, and ambulating normally  Well nourished, well hydrated White female, no apparent distress Heart- rrr Lungs- CTAB Abd- benign Results for orders placed or performed during the hospital encounter of 08/15/19 (from the past 24 hour(s))  Pregnancy, urine POC     Status: None   Collection Time: 08/15/19  6:48 AM  Result Value Ref Range   Preg Test, Ur NEGATIVE NEGATIVE    No results found.  Assessment/Plan: Retained IUD- plan for removal in the OR.  She understands the risks of surgery, including, but not to infection, bleeding, DVTs, damage to bowel, bladder, ureters. She wishes to proceed.     Kelli Miller 08/15/2019, 7:21 AM

## 2019-08-15 NOTE — Anesthesia Postprocedure Evaluation (Signed)
Anesthesia Post Note  Patient: Kelli Miller  Procedure(s) Performed: INTRAUTERINE DEVICE (IUD) REMOVAL (N/A )     Patient location during evaluation: PACU Anesthesia Type: General Level of consciousness: awake and alert Pain management: pain level controlled Vital Signs Assessment: post-procedure vital signs reviewed and stable Respiratory status: spontaneous breathing, nonlabored ventilation, respiratory function stable and patient connected to nasal cannula oxygen Cardiovascular status: blood pressure returned to baseline and stable Postop Assessment: no apparent nausea or vomiting Anesthetic complications: no    Last Vitals:  Vitals:   08/15/19 1022 08/15/19 1030  BP:    Pulse: 60 (!) 59  Resp: 12 12  Temp:    SpO2: 98% 98%    Last Pain:  Vitals:   08/15/19 1015  TempSrc:   PainSc: 0-No pain                 Jakala Herford L Daivd Fredericksen

## 2019-08-15 NOTE — Anesthesia Preprocedure Evaluation (Addendum)
Anesthesia Evaluation  Patient identified by MRN, date of birth, ID band Patient awake    Reviewed: Allergy & Precautions, NPO status , Patient's Chart, lab work & pertinent test results  Airway Mallampati: III  TM Distance: >3 FB Neck ROM: Full    Dental no notable dental hx. (+) Teeth Intact, Dental Advisory Given   Pulmonary neg pulmonary ROS, former smoker,    Pulmonary exam normal breath sounds clear to auscultation       Cardiovascular hypertension, Pt. on medications negative cardio ROS Normal cardiovascular exam Rhythm:Regular Rate:Normal     Neuro/Psych negative neurological ROS  negative psych ROS   GI/Hepatic Neg liver ROS, GERD  Controlled,  Endo/Other  diabetes, Oral Hypoglycemic AgentsHypothyroidism Morbid obesityPCOS  Renal/GU negative Renal ROS  negative genitourinary   Musculoskeletal negative musculoskeletal ROS (+)   Abdominal   Peds  Hematology negative hematology ROS (+)   Anesthesia Other Findings   Reproductive/Obstetrics                           Anesthesia Physical Anesthesia Plan  ASA: III  Anesthesia Plan: General   Post-op Pain Management:    Induction: Intravenous  PONV Risk Score and Plan: Ondansetron, Dexamethasone and Midazolam  Airway Management Planned: LMA  Additional Equipment:   Intra-op Plan:   Post-operative Plan: Extubation in OR  Informed Consent: I have reviewed the patients History and Physical, chart, labs and discussed the procedure including the risks, benefits and alternatives for the proposed anesthesia with the patient or authorized representative who has indicated his/her understanding and acceptance.     Dental advisory given  Plan Discussed with: CRNA  Anesthesia Plan Comments:         Anesthesia Quick Evaluation

## 2019-08-15 NOTE — Anesthesia Procedure Notes (Signed)
Procedure Name: LMA Insertion Date/Time: 08/15/2019 9:15 AM Performed by: Justice Rocher, CRNA Pre-anesthesia Checklist: Patient identified, Emergency Drugs available, Suction available and Patient being monitored Patient Re-evaluated:Patient Re-evaluated prior to induction Oxygen Delivery Method: Circle system utilized Preoxygenation: Pre-oxygenation with 100% oxygen Induction Type: IV induction Ventilation: Mask ventilation without difficulty LMA: LMA inserted LMA Size: 4.0 Number of attempts: 1 Airway Equipment and Method: Bite block Placement Confirmation: positive ETCO2 and breath sounds checked- equal and bilateral Tube secured with: Tape Dental Injury: Teeth and Oropharynx as per pre-operative assessment

## 2019-08-15 NOTE — Transfer of Care (Signed)
Immediate Anesthesia Transfer of Care Note  Patient: Kelli Miller  Procedure(s) Performed: Procedure(s) (LRB): INTRAUTERINE DEVICE (IUD) REMOVAL (N/A)  Patient Location: PACU  Anesthesia Type: General  Level of Consciousness: awake, sedated, patient cooperative and responds to stimulation  Airway & Oxygen Therapy: Patient Spontanous Breathing and Patient connected to [place] 02 and soft FM  Post-op Assessment: Report given to PACU RN, Post -op Vital signs reviewed and stable and Patient moving all extremities  Post vital signs: Reviewed and stable  Complications: No apparent anesthesia complications

## 2019-08-15 NOTE — Discharge Instructions (Signed)

## 2019-08-15 NOTE — Op Note (Signed)
08/15/2019  9:36 AM  PATIENT:  Kelli Miller  38 y.o. female  PRE-OPERATIVE DIAGNOSIS:  Retained IUD  POST-OPERATIVE DIAGNOSIS:  same  PROCEDURE:  Procedure(s): INTRAUTERINE DEVICE (IUD) REMOVAL (N/A)  SURGEON:  Surgeon(s) and Role:    * Mysha Peeler, Wilhemina Cash, MD - Primary  ASSISTANTS: Gerre Pebbles, MS3   ANESTHESIA:   local and general  EBL:  3 mL   BLOOD ADMINISTERED:none  DRAINS: none   LOCAL MEDICATIONS USED:  MARCAINE     SPECIMEN:  No Specimen  DISPOSITION OF SPECIMEN:  N/A  COUNTS:  YES  TOURNIQUET:  * No tourniquets in log *  DICTATION: .Dragon Dictation  PLAN OF CARE: Discharge to home after PACU  PATIENT DISPOSITION:  PACU - hemodynamically stable.   Delay start of Pharmacological VTE agent (>24hrs) due to surgical blood loss or risk of bleeding: not applicable  The risks, benefit, and alternatives were explained, understood, and accepted. Consent was signed. In the OR, anesthesia was given without complication. Time out was done. Her abdomen was prepped and draped in the usual sterile fashion. A bimanual exam revealed no masses but was limited by her body habitus. A speculum was placed and her cervix was grasped with a single tooth tenaculum. 30 cc of 0.5% marcaine was injected for a cervical block. I used an IUD hook to bring the IUD strings out of the cervix. I then used a ring forceps to pull the intact IUD out easily. No bleeding was noted at the end of the case. She was taken to the recovery room in stable condition.

## 2019-08-16 ENCOUNTER — Encounter (HOSPITAL_BASED_OUTPATIENT_CLINIC_OR_DEPARTMENT_OTHER): Payer: Self-pay | Admitting: Obstetrics & Gynecology

## 2019-08-18 ENCOUNTER — Other Ambulatory Visit: Payer: Self-pay

## 2019-08-18 ENCOUNTER — Ambulatory Visit (INDEPENDENT_AMBULATORY_CARE_PROVIDER_SITE_OTHER): Payer: PRIVATE HEALTH INSURANCE | Admitting: Osteopathic Medicine

## 2019-08-18 VITALS — BP 144/82 | HR 98 | Wt 243.8 lb

## 2019-08-18 DIAGNOSIS — R030 Elevated blood-pressure reading, without diagnosis of hypertension: Secondary | ICD-10-CM

## 2019-08-18 MED ORDER — METFORMIN HCL 1000 MG PO TABS: 1000.0000 mg | ORAL_TABLET | Freq: Two times a day (BID) | ORAL | 0 refills | Status: DC

## 2019-08-18 NOTE — Progress Notes (Signed)
Pt came into clinic today for BP check to see if she would be a candidate for Phentermine. Her BP was elevated on both manual blood pressure checks. Per PCP she can monitor BP at home, then come in for evaluation and discussion in a couple weeks for non stimulate options or she could get referred to the weight loss clinic. Pt is going to monitor BP at home, then come see PCP in 2 weeks for review. She will bring home machine with her to check accuracy. No further questions.   Denied flu shot

## 2019-09-01 ENCOUNTER — Ambulatory Visit (INDEPENDENT_AMBULATORY_CARE_PROVIDER_SITE_OTHER): Payer: PRIVATE HEALTH INSURANCE | Admitting: Osteopathic Medicine

## 2019-09-01 ENCOUNTER — Encounter: Payer: Self-pay | Admitting: Osteopathic Medicine

## 2019-09-01 ENCOUNTER — Other Ambulatory Visit: Payer: Self-pay

## 2019-09-01 VITALS — BP 153/87 | HR 98 | Temp 98.6°F | Wt 246.0 lb

## 2019-09-01 DIAGNOSIS — E559 Vitamin D deficiency, unspecified: Secondary | ICD-10-CM

## 2019-09-01 DIAGNOSIS — E1169 Type 2 diabetes mellitus with other specified complication: Secondary | ICD-10-CM | POA: Diagnosis not present

## 2019-09-01 DIAGNOSIS — E039 Hypothyroidism, unspecified: Secondary | ICD-10-CM

## 2019-09-01 DIAGNOSIS — E282 Polycystic ovarian syndrome: Secondary | ICD-10-CM

## 2019-09-01 MED ORDER — OZEMPIC (0.25 OR 0.5 MG/DOSE) 2 MG/1.5ML ~~LOC~~ SOPN: PEN_INJECTOR | SUBCUTANEOUS | 2 refills | Status: AC

## 2019-09-01 NOTE — Progress Notes (Signed)
HPI: Kelli Miller is a 38 y.o. female who  has a past medical history of Antiphospholipid antibody syndrome ([place]), GERD (gastroesophageal reflux disease), History of trichomoniasis (2019), Hypertension, Hypothyroidism, PCOS (polycystic ovarian syndrome) (2015), Psoriasis, Retained intrauterine contraceptive device (IUD), and Type 2 diabetes mellitus ([place]).  she [facility] today, 09/01/19,  for chief complaint of:  HTN follow-up   BP elevated Pt has her own BP monitor, we compared it today w/ our equipment Our: 153/87 Hers: 142/92  Her home numbers:  Systolic [phone] Diastolic 17-408  Current antihypertensive meds:  Amlodipine 10 mg daily   BP Readings from Last 3 Encounters:  09/01/19 (!) 153/87  08/18/19 (!) 144/82  08/15/19 118/67   Wt Readings from Last 3 Encounters:  09/01/19 246 lb 0.3 oz (111.6 kg)  08/18/19 243 lb 12.8 oz (110.6 kg)  08/15/19 245 lb 5 oz (111.3 kg)       At today's visit 09/01/19 ... PMH, PSH, FH reviewed and updated as needed.  Current medication list and allergy/intolerance hx reviewed and updated as needed. (See remainder of HPI, ROS, Phys Exam below)   No results found.  No results found for this or any previous visit (from the past 72 hour(s)).        ASSESSMENT/PLAN: The primary encounter diagnosis was Hypothyroidism, unspecified type. Diagnoses of Type 2 diabetes mellitus with other specified complication, without long-term current use of insulin ([place]), PCOS (polycystic ovarian syndrome), and Vitamin D deficiency were also pertinent to this visit.   Pt would like to work on weight loss and getting sugars under better control. I think reasonable to try this before adding another antihypertensive. Will trial SGLT2 for better A1C as well as weight loss benefit, pt to work on increasing activity and stricter lower-carb diet   Orders Placed This Encounter  Procedures  . TSH  . T4,  FREE  . A1C  . VITAMIN D 25     Meds ordered this encounter  Medications  . Semaglutide,0.25 or 0.5MG /DOS, (OZEMPIC, 0.25 OR 0.5 MG/DOSE,) 2 MG/1.5ML SOPN    Sig: Inject 0.25 mg into the skin once a week for 30 days, THEN 0.5 mg once a week.    Dispense:  4 pen    Refill:  2    There are no Patient Instructions on file for this visit.    Follow-up plan: Return in about 3 months (around 12/02/2019) for recheck BP / weight .                                                 ################################################# ################################################# ################################################# #################################################    No outpatient medications have been marked as taking for the 09/01/19 encounter (Appointment) with Emeterio Reeve, DO.    Allergies  Allergen Reactions  . Bactrim [Sulfamethoxazole-Trimethoprim] Rash       Review of Systems:  Constitutional: No recent illness  HEENT: No  headache, no vision change  Cardiac: No  chest pain, No  pressure, No palpitations  Respiratory:  No  shortness of breath. No  Cough  Gastrointestinal: No  abdominal pain, no change on bowel habits  Musculoskeletal: No new myalgia/arthralgia  Skin: No  Rash  Neurologic: No  weakness, No  Dizziness   Exam:  BP (!) 153/87 (BP Location: Left Arm, Patient Position: Sitting, Cuff Size: Normal)  Pulse 98   Temp 98.6 F (37 C) (Oral)   Wt 246 lb 0.3 oz (111.6 kg)   BMI 46.49 kg/m   Constitutional: VS see above. General Appearance: alert, well-developed, well-nourished, NAD  Eyes: Normal lids and conjunctive, non-icteric sclera  Neck: No masses, trachea midline.   Respiratory: Normal respiratory effort. no wheeze, no rhonchi, no rales  Cardiovascular: S1/S2 normal, no murmur, no rub/gallop auscultated. RRR.   Neurological: Normal balance/coordination. No tremor.  Skin:  warm, dry, intact.   Psychiatric: Normal judgment/insight. Normal mood and affect. Oriented x3.       Visit summary with medication list and pertinent instructions was printed for patient to review, patient was advised to alert [place] if any updates are needed. All questions at time of visit were answered - patient instructed to contact office with any additional concerns. ER/RTC precautions were reviewed with the patient and understanding verbalized.   Note: Total time spent 25 minutes, greater than 50% of the visit was spent face-to-face counseling and coordinating care for the following: The primary encounter diagnosis was Hypothyroidism, unspecified type. Diagnoses of Type 2 diabetes mellitus with other specified complication, without long-term current use of insulin (HCC), PCOS (polycystic ovarian syndrome), and Vitamin D deficiency were also pertinent to this visit.Marland Kitchen  Please note: voice recognition software was used to produce this document, and typos may escape review. Please contact Dr. Lyn Hollingshead for any needed clarifications.    Follow up plan: Return in about 3 months (around 12/02/2019) for recheck BP / weight .

## 2019-09-02 LAB — T4, FREE: Free T4: 1.4 ng/dL (ref 0.8–1.8)

## 2019-09-02 LAB — HEMOGLOBIN A1C
Hgb A1c MFr Bld: 7.1 % of total Hgb — ABNORMAL HIGH (ref <5.7)
Mean Plasma Glucose: 157 (calc)
eAG (mmol/L): 8.7 (calc)

## 2019-09-02 LAB — VITAMIN D 25 HYDROXY (VIT D DEFICIENCY, FRACTURES): Vit D, 25-Hydroxy: 35 ng/mL (ref 30–100)

## 2019-09-02 LAB — TSH: TSH: 1.25 mIU/L

## 2019-09-20 ENCOUNTER — Ambulatory Visit (INDEPENDENT_AMBULATORY_CARE_PROVIDER_SITE_OTHER): Payer: PRIVATE HEALTH INSURANCE | Admitting: Physician Assistant

## 2019-09-20 ENCOUNTER — Other Ambulatory Visit: Payer: Self-pay

## 2019-09-20 VITALS — BP 152/86 | HR 98 | Ht 61.0 in | Wt 238.0 lb

## 2019-09-20 DIAGNOSIS — S90821A Blister (nonthermal), right foot, initial encounter: Secondary | ICD-10-CM

## 2019-09-20 DIAGNOSIS — E1169 Type 2 diabetes mellitus with other specified complication: Secondary | ICD-10-CM | POA: Diagnosis not present

## 2019-09-20 DIAGNOSIS — S91301A Unspecified open wound, right foot, initial encounter: Secondary | ICD-10-CM | POA: Diagnosis not present

## 2019-09-20 MED ORDER — DOXYCYCLINE HYCLATE 100 MG PO TABS: 100.0000 mg | ORAL_TABLET | Freq: Two times a day (BID) | ORAL | 0 refills | Status: DC

## 2019-09-20 NOTE — Progress Notes (Signed)
   Subjective:    Patient ID: Kelli Miller, female    DOB: 1980/12/04, 38 y.o.   MRN: [id number]  HPI  Pt is a 38 yo female with T2DM who presents to the clinic with a right foot plantar sore. She thought she stepped on something small and it hurt for a few days. She then started noticing a white blister and pressure in the area. She has tried corn pads which have not helped. It is sore to bear weight. No fever, chills, warmth or redness.   Last a1c:  .Marland Kitchen Lab Results  Component Value Date   HGBA1C 7.1 (H) 09/01/2019    Review of Systems See HPI.     Objective:   Physical Exam Vitals signs reviewed.  Constitutional:      Appearance: Normal appearance. She is obese.  Cardiovascular:     Rate and Rhythm: Normal rate.     Pulses: Normal pulses.  Pulmonary:     Effort: Pulmonary effort is normal.     Breath sounds: Normal breath sounds.  Musculoskeletal:     Right lower leg: No edema.     Left lower leg: No edema.  Skin:    Comments: Plantar right medial foot 1cm by 1cm pustule. Tender to palpation. No redness or warmth.   Neurological:     General: No focal deficit present.     Mental Status: She is alert and oriented to person, place, and time.  Psychiatric:        Mood and Affect: Mood normal.        Behavior: Behavior normal.           Assessment & Plan:   Marland KitchenMarland KitchenDarleny was seen today for foot problem.  Diagnoses and all orders for this visit:  Wound of right foot -     doxycycline (VIBRA-TABS) 100 MG tablet; Take 1 tablet (100 mg total) by mouth 2 (two) times daily.  Type 2 diabetes mellitus with other specified complication, without long-term current use of insulin ([place])    .Marland Kitchen Diabetic Foot Exam - Simple   Simple Foot Form Diabetic Foot exam was performed with the following findings: Yes 09/20/2019  9:39 AM  Visual Inspection No deformities, no ulcerations, no other skin breakdown bilaterally: Yes See comments: Yes Sensation Testing Intact to touch and  monofilament testing bilaterally: Yes Pulse Check Posterior Tibialis and Dorsalis pulse intact bilaterally: Yes Comments One white/painful area on bottom of right foot    Concern for infection and non healing wound being a diabetic. I and D today. Placed in post op shoe to help take some pressure off wound. Start doxycycline. Elevate foot when can. Keep pressure bandage on it. Clean with warm soapy water and then keep dry. Follow up in 1 week.   Incision and Drainage Procedure Note  Pre-operative Diagnosis: pustule/infection.   Post-operative Diagnosis: same  Indications: pain/infection/DM complications  Procedure Details  The procedure, risks and complications have been discussed in detail (including, but not limited to airway compromise, infection, bleeding) with the patient, and the patient has signed consent to the procedure.  The skin was sterilely prepped and draped over the affected area in the usual fashion. After adequate local anesthesia, I&D with a #11 blade was performed on the right medial plantar foot. Purulent drainage: present The patient was observed until stable.  Condition: Tolerated procedure well   Complications: none.

## 2019-09-23 ENCOUNTER — Encounter: Payer: Self-pay | Admitting: Physician Assistant

## 2019-09-25 ENCOUNTER — Other Ambulatory Visit: Payer: Self-pay | Admitting: Osteopathic Medicine

## 2019-09-29 ENCOUNTER — Encounter: Payer: Self-pay | Admitting: Physician Assistant

## 2019-09-29 ENCOUNTER — Other Ambulatory Visit: Payer: Self-pay

## 2019-09-29 ENCOUNTER — Ambulatory Visit (INDEPENDENT_AMBULATORY_CARE_PROVIDER_SITE_OTHER): Payer: PRIVATE HEALTH INSURANCE | Admitting: Physician Assistant

## 2019-09-29 VITALS — BP 147/81 | HR 108 | Ht 61.0 in | Wt 238.0 lb

## 2019-09-29 DIAGNOSIS — B379 Candidiasis, unspecified: Secondary | ICD-10-CM | POA: Insufficient documentation

## 2019-09-29 DIAGNOSIS — S91301A Unspecified open wound, right foot, initial encounter: Secondary | ICD-10-CM

## 2019-09-29 MED ORDER — TERCONAZOLE 0.4 % VA CREA: 1.0000 | TOPICAL_CREAM | Freq: Every day | VAGINAL | 0 refills | Status: DC

## 2019-09-29 NOTE — Progress Notes (Signed)
   Subjective:    Patient ID: Beau Fanny, female    DOB: 14-Feb-1981, 38 y.o.   MRN: [id number]  HPI Patient is a 38 year old female who presents to the clinic 1 week after being treated for a right foot infection on the plantar surface.  She is a diabetic. She finished doxycycline today. She is in a post op boot. No problems or concerns. No discharge or redness. No drainage.   She feels like she is getting a yeast infection vaginally from abx.   .. Active Ambulatory Problems    Diagnosis Date Noted  . PCOS (polycystic ovarian syndrome) 01/13/2018  . GERD (gastroesophageal reflux disease) 01/13/2018  . Psoriasis 01/13/2018  . Elevated TSH 01/16/2016  . Hypothyroidism 08/30/2018  . Diabetes mellitus ([place]) 08/30/2018  . B12 deficiency 08/30/2018  . Vitamin D deficiency 08/30/2018  . Family history of premature CAD 08/30/2018  . History of antiphospholipid syndrome 08/30/2018  . Wound of right foot 09/20/2019  . Yeast infection 09/29/2019   Resolved Ambulatory Problems    Diagnosis Date Noted  . Supervision of high risk pregnancy, antepartum 12/16/2017  . Pre-existing type 2 diabetes mellitus in pregnancy in second trimester 12/16/2017  . Hypothyroidism affecting pregnancy, antepartum 12/16/2017  . Advanced maternal age in multigravida 12/27/2017  . [redacted] weeks gestation of pregnancy   . Obesity in pregnancy 12/29/2017  . Chronic hypertension in pregnancy 12/29/2017  . Abnormal antenatal test 01/07/2018  . Recurrent pregnancy loss 01/16/2016  . Antiphospholipid antibody syndrome ([place]) 01/13/2018  . Hypomenorrhea/oligomenorrhea 01/16/2016  . Trichomoniasis 05/30/2018  . GBS (group B Streptococcus carrier), +RV culture, currently pregnant 06/17/2018   Past Medical History:  Diagnosis Date  . History of trichomoniasis 2019  . Hypertension   . Retained intrauterine contraceptive device (IUD)   . Type 2 diabetes mellitus ([place])       Review of Systems    see HPI.   Objective:   Physical Exam Vitals signs reviewed.  Constitutional:      Appearance: Normal appearance.  Skin:    Comments: Right blister/infection/abcess is healing. New skin growing underneath. Skin slightly peeling over the top is hard. No redness, tenderness, drainage, or erythema.   Neurological:     Mental Status: She is alert.  Psychiatric:        Mood and Affect: Mood normal.           Assessment & Plan:  Marland KitchenMarland KitchenKilea was seen today for foot problem.  Diagnoses and all orders for this visit:  Wound of right foot  Yeast infection -     terconazole (TERAZOL 7) 0.4 % vaginal cream; Place 1 applicator vaginally at bedtime. For 5 days.   Healing well. Wear boot for 1 week. Use corn pad after. Follow up as needed. Discussed signs of infection.   terazol given for vaginitis.

## 2019-10-16 ENCOUNTER — Other Ambulatory Visit: Payer: Self-pay | Admitting: Osteopathic Medicine

## 2019-10-16 NOTE — Telephone Encounter (Signed)
Requested medication (s) are due for refill today: yes  Requested medication (s) are on the active medication list: yes  Last refill:  06/13/2019  Future visit scheduled: no  Notes to clinic: review for refill    Requested Prescriptions  Pending Prescriptions Disp Refills   levothyroxine (SYNTHROID) 50 MCG tablet [Pharmacy Med Name: Levothyroxine Sodium 50 MCG Oral Tablet] 90 tablet 0    Sig: TAKE 1 TABLET BY MOUTH ONCE DAILY BEFORE BREAKFAST      Endocrinology:  Hypothyroid Agents Failed - 10/16/2019 11:37 AM      Failed - TSH needs to be rechecked within 3 months after an abnormal result. Refill until TSH is due.      Passed - TSH in normal range and within 360 days    TSH  Date Value Ref Range Status  09/01/2019 1.25 mIU/L Final    Comment:              Reference Range .           > or = 20 Years  0.40-4.50 .                Pregnancy Ranges           First trimester    0.26-2.66           Second trimester   0.55-2.73           Third trimester    0.43-2.91           Passed - Valid encounter within last 12 months    Recent Outpatient Visits           2 weeks ago Wound of right foot   [facility], Royetta Car, PA-C   3 weeks ago Wound of right foot   [place], Royetta Car, PA-C   1 month ago Hypothyroidism, unspecified type   [facility], [place], DO   1 month ago Elevated blood pressure reading   [place], Lanelle Bal, DO   4 months ago Attempted IUD removal, unsuccessful   [facility], [place], DO

## 2019-10-31 ENCOUNTER — Other Ambulatory Visit: Payer: Self-pay | Admitting: Osteopathic Medicine

## 2019-11-03 ENCOUNTER — Encounter: Payer: Self-pay | Admitting: Osteopathic Medicine

## 2019-12-25 ENCOUNTER — Other Ambulatory Visit: Payer: Self-pay | Admitting: Osteopathic Medicine

## 2019-12-25 NOTE — Telephone Encounter (Signed)
Please review for refill- patient at [facility] 

## 2020-01-01 ENCOUNTER — Encounter: Payer: Self-pay | Admitting: *Deleted

## 2020-01-01 ENCOUNTER — Telehealth: Payer: Self-pay | Admitting: *Deleted

## 2020-01-01 DIAGNOSIS — O0990 Supervision of high risk pregnancy, unspecified, unspecified trimester: Secondary | ICD-10-CM | POA: Insufficient documentation

## 2020-01-01 MED ORDER — PROGESTERONE MICRONIZED 200 MG PO CAPS: 200.0000 mg | ORAL_CAPSULE | Freq: Every day | ORAL | 3 refills | Status: DC

## 2020-01-01 MED ORDER — ENOXAPARIN SODIUM 40 MG/0.4ML ~~LOC~~ SOLN: 40.0000 mg | SUBCUTANEOUS | 6 refills | Status: DC

## 2020-01-01 NOTE — Telephone Encounter (Signed)
Pt called stating she is pregnancy with LMP of 11/27/19.  She is Antiphospholipid positive and need RX for Lovenox.  She also was given Prometrium in her last pregnancy due to recureent SAB.  I spoke with Dr Macon Large who agreed that pt can be put on the meds.  NOB appt made for 01/11/20

## 2020-01-03 ENCOUNTER — Encounter: Payer: Self-pay | Admitting: *Deleted

## 2020-01-10 ENCOUNTER — Other Ambulatory Visit: Payer: Self-pay

## 2020-01-10 ENCOUNTER — Ambulatory Visit (INDEPENDENT_AMBULATORY_CARE_PROVIDER_SITE_OTHER): Payer: PRIVATE HEALTH INSURANCE | Admitting: Osteopathic Medicine

## 2020-01-10 ENCOUNTER — Encounter: Payer: Self-pay | Admitting: Osteopathic Medicine

## 2020-01-10 VITALS — BP 130/83 | HR 96 | Temp 98.2°F | Wt 231.1 lb

## 2020-01-10 DIAGNOSIS — O139 Gestational [pregnancy-induced] hypertension without significant proteinuria, unspecified trimester: Secondary | ICD-10-CM

## 2020-01-10 DIAGNOSIS — O24419 Gestational diabetes mellitus in pregnancy, unspecified control: Secondary | ICD-10-CM

## 2020-01-10 NOTE — Progress Notes (Signed)
Pt inquires about Inuslin Has some left over from last pregnancy Fasting Glc 140s or so OB visit tomorrow I'd defer to OBGYN re: GDM management at this point Advised they may change Amlodipine but this shouldn't be a problem to continue for now.

## 2020-01-11 ENCOUNTER — Ambulatory Visit (INDEPENDENT_AMBULATORY_CARE_PROVIDER_SITE_OTHER): Payer: PRIVATE HEALTH INSURANCE | Admitting: Obstetrics & Gynecology

## 2020-01-11 ENCOUNTER — Encounter: Payer: Self-pay | Admitting: Obstetrics & Gynecology

## 2020-01-11 VITALS — BP 148/77 | HR 107 | Temp 99.4°F | Wt 230.0 lb

## 2020-01-11 DIAGNOSIS — E039 Hypothyroidism, unspecified: Secondary | ICD-10-CM

## 2020-01-11 DIAGNOSIS — O0990 Supervision of high risk pregnancy, unspecified, unspecified trimester: Secondary | ICD-10-CM

## 2020-01-11 DIAGNOSIS — O24319 Unspecified pre-existing diabetes mellitus in pregnancy, unspecified trimester: Secondary | ICD-10-CM

## 2020-01-11 DIAGNOSIS — Z862 Personal history of diseases of the blood and blood-forming organs and certain disorders involving the immune mechanism: Secondary | ICD-10-CM

## 2020-01-11 DIAGNOSIS — Z1151 Encounter for screening for human papillomavirus (HPV): Secondary | ICD-10-CM

## 2020-01-11 DIAGNOSIS — O99280 Endocrine, nutritional and metabolic diseases complicating pregnancy, unspecified trimester: Secondary | ICD-10-CM

## 2020-01-11 DIAGNOSIS — O09291 Supervision of pregnancy with other poor reproductive or obstetric history, first trimester: Secondary | ICD-10-CM

## 2020-01-11 DIAGNOSIS — O99211 Obesity complicating pregnancy, first trimester: Secondary | ICD-10-CM

## 2020-01-11 DIAGNOSIS — Z124 Encounter for screening for malignant neoplasm of cervix: Secondary | ICD-10-CM | POA: Diagnosis not present

## 2020-01-11 DIAGNOSIS — Z113 Encounter for screening for infections with a predominantly sexual mode of transmission: Secondary | ICD-10-CM

## 2020-01-11 DIAGNOSIS — Z98891 History of uterine scar from previous surgery: Secondary | ICD-10-CM | POA: Insufficient documentation

## 2020-01-11 DIAGNOSIS — O10911 Unspecified pre-existing hypertension complicating pregnancy, first trimester: Secondary | ICD-10-CM

## 2020-01-11 DIAGNOSIS — O99210 Obesity complicating pregnancy, unspecified trimester: Secondary | ICD-10-CM | POA: Insufficient documentation

## 2020-01-11 DIAGNOSIS — Z3A01 Less than 8 weeks gestation of pregnancy: Secondary | ICD-10-CM

## 2020-01-11 DIAGNOSIS — O34219 Maternal care for unspecified type scar from previous cesarean delivery: Secondary | ICD-10-CM

## 2020-01-11 DIAGNOSIS — O09299 Supervision of pregnancy with other poor reproductive or obstetric history, unspecified trimester: Secondary | ICD-10-CM

## 2020-01-11 DIAGNOSIS — O10919 Unspecified pre-existing hypertension complicating pregnancy, unspecified trimester: Secondary | ICD-10-CM

## 2020-01-11 DIAGNOSIS — O09521 Supervision of elderly multigravida, first trimester: Secondary | ICD-10-CM

## 2020-01-11 MED ORDER — INSULIN NPH (HUMAN) (ISOPHANE) 100 UNIT/ML ~~LOC~~ SUSP: SUBCUTANEOUS | 3 refills | Status: DC

## 2020-01-11 MED ORDER — INSULIN LISPRO 100 UNIT/ML ~~LOC~~ SOLN: SUBCUTANEOUS | 11 refills | Status: DC

## 2020-01-11 NOTE — Patient Instructions (Signed)
First Trimester of Pregnancy The first trimester of pregnancy is from week 1 until the end of week 13 (months 1 through 3). A week after a sperm fertilizes an egg, the egg will implant on the wall of the uterus. This embryo will begin to develop into a baby. Genes from you and your partner will form the baby. The female genes will determine whether the baby will be a boy or a girl. At 6-8 weeks, the eyes and face will be formed, and the heartbeat can be seen on ultrasound. At the end of 12 weeks, all the baby's organs will be formed. Now that you are pregnant, you will want to do everything you can to have a healthy baby. Two of the most important things are to get good prenatal care and to follow your health care provider's instructions. Prenatal care is all the medical care you receive before the baby's birth. This care will help prevent, find, and treat any problems during the pregnancy and childbirth. Body changes during your first trimester Your body goes through many changes during pregnancy. The changes vary from woman to woman.  You may gain or lose a couple of pounds at first.  You may feel sick to your stomach (nauseous) and you may throw up (vomit). If the vomiting is uncontrollable, call your health care provider.  You may tire easily.  You may develop headaches that can be relieved by medicines. All medicines should be approved by your health care provider.  You may urinate more often. Painful urination may mean you have a bladder infection.  You may develop heartburn as a result of your pregnancy.  You may develop constipation because certain hormones are causing the muscles that push stool through your intestines to slow down.  You may develop hemorrhoids or swollen veins (varicose veins).  Your breasts may begin to grow larger and become tender. Your nipples may stick out more, and the tissue that surrounds them (areola) may become darker.  Your gums may bleed and may be  sensitive to brushing and flossing.  Dark spots or blotches (chloasma, mask of pregnancy) may develop on your face. This will likely fade after the baby is born.  Your menstrual periods will stop.  You may have a loss of appetite.  You may develop cravings for certain kinds of food.  You may have changes in your emotions from day to day, such as being excited to be pregnant or being concerned that something may go wrong with the pregnancy and baby.  You may have more vivid and strange dreams.  You may have changes in your hair. These can include thickening of your hair, rapid growth, and changes in texture. Some women also have hair loss during or after pregnancy, or hair that feels dry or thin. Your hair will most likely return to normal after your baby is born. What to expect at prenatal visits During a routine prenatal visit:  You will be weighed to make sure you and the baby are growing normally.  Your blood pressure will be taken.  Your abdomen will be measured to track your baby's growth.  The fetal heartbeat will be listened to between weeks 10 and 14 of your pregnancy.  Test results from any previous visits will be discussed. Your health care provider may ask you:  How you are feeling.  If you are feeling the baby move.  If you have had any abnormal symptoms, such as leaking fluid, bleeding, severe headaches, or abdominal   cramping.  If you are using any tobacco products, including cigarettes, chewing tobacco, and electronic cigarettes.  If you have any questions. Other tests that may be performed during your first trimester include:  Blood tests to find your blood type and to check for the presence of any previous infections. The tests will also be used to check for low iron levels (anemia) and protein on red blood cells (Rh antibodies). Depending on your risk factors, or if you previously had diabetes during pregnancy, you may have tests to check for high blood sugar  that affects pregnant women (gestational diabetes).  Urine tests to check for infections, diabetes, or protein in the urine.  An ultrasound to confirm the proper growth and development of the baby.  Fetal screens for spinal cord problems (spina bifida) and Down syndrome.  HIV (human immunodeficiency virus) testing. Routine prenatal testing includes screening for HIV, unless you choose not to have this test.  You may need other tests to make sure you and the baby are doing well. Follow these instructions at home: Medicines  Follow your health care provider's instructions regarding medicine use. Specific medicines may be either safe or unsafe to take during pregnancy.  Take a prenatal vitamin that contains at least 600 micrograms (mcg) of folic acid.  If you develop constipation, try taking a stool softener if your health care provider approves. Eating and drinking   Eat a balanced diet that includes fresh fruits and vegetables, whole grains, good sources of protein such as meat, eggs, or tofu, and low-fat dairy. Your health care provider will help you determine the amount of weight gain that is right for you.  Avoid raw meat and uncooked cheese. These carry germs that can cause birth defects in the baby.  Eating four or five small meals rather than three large meals a day may help relieve nausea and vomiting. If you start to feel nauseous, eating a few soda crackers can be helpful. Drinking liquids between meals, instead of during meals, also seems to help ease nausea and vomiting.  Limit foods that are high in fat and processed sugars, such as fried and sweet foods.  To prevent constipation: ? Eat foods that are high in fiber, such as fresh fruits and vegetables, whole grains, and beans. ? Drink enough fluid to keep your urine clear or pale yellow. Activity  Exercise only as directed by your health care provider. Most women can continue their usual exercise routine during  pregnancy. Try to exercise for 30 minutes at least 5 days a week. Exercising will help you: ? Control your weight. ? Stay in shape. ? Be prepared for labor and delivery.  Experiencing pain or cramping in the lower abdomen or lower back is a good sign that you should stop exercising. Check with your health care provider before continuing with normal exercises.  Try to avoid standing for long periods of time. Move your legs often if you must stand in one place for a long time.  Avoid heavy lifting.  Wear low-heeled shoes and practice good posture.  You may continue to have sex unless your health care provider tells you not to. Relieving pain and discomfort  Wear a good support bra to relieve breast tenderness.  Take warm sitz baths to soothe any pain or discomfort caused by hemorrhoids. Use hemorrhoid cream if your health care provider approves.  Rest with your legs elevated if you have leg cramps or low back pain.  If you develop varicose veins in   your legs, wear support hose. Elevate your feet for 15 minutes, 3-4 times a day. Limit salt in your diet. Prenatal care  Schedule your prenatal visits by the twelfth week of pregnancy. They are usually scheduled monthly at first, then more often in the last 2 months before delivery.  Write down your questions. Take them to your prenatal visits.  Keep all your prenatal visits as told by your health care provider. This is important. Safety  Wear your seat belt at all times when driving.  Make a list of emergency phone numbers, including numbers for family, friends, the hospital, and police and fire departments. General instructions  Ask your health care provider for a referral to a local prenatal education class. Begin classes no later than the beginning of month 6 of your pregnancy.  Ask for help if you have counseling or nutritional needs during pregnancy. Your health care provider can offer advice or refer you to specialists for help  with various needs.  Do not use hot tubs, steam rooms, or saunas.  Do not douche or use tampons or scented sanitary pads.  Do not cross your legs for long periods of time.  Avoid cat litter boxes and soil used by cats. These carry germs that can cause birth defects in the baby and possibly loss of the fetus by miscarriage or stillbirth.  Avoid all smoking, herbs, alcohol, and medicines not prescribed by your health care provider. Chemicals in these products affect the formation and growth of the baby.  Do not use any products that contain nicotine or tobacco, such as cigarettes and e-cigarettes. If you need help quitting, ask your health care provider. You may receive counseling support and other resources to help you quit.  Schedule a dentist appointment. At home, brush your teeth with a soft toothbrush and be gentle when you floss. Contact a health care provider if:  You have dizziness.  You have mild pelvic cramps, pelvic pressure, or nagging pain in the abdominal area.  You have persistent nausea, vomiting, or diarrhea.  You have a bad smelling vaginal discharge.  You have pain when you urinate.  You notice increased swelling in your face, hands, legs, or ankles.  You are exposed to fifth disease or chickenpox.  You are exposed to German measles (rubella) and have never had it. Get help right away if:  You have a fever.  You are leaking fluid from your vagina.  You have spotting or bleeding from your vagina.  You have severe abdominal cramping or pain.  You have rapid weight gain or loss.  You vomit blood or material that looks like coffee grounds.  You develop a severe headache.  You have shortness of breath.  You have any kind of trauma, such as from a fall or a car accident. Summary  The first trimester of pregnancy is from week 1 until the end of week 13 (months 1 through 3).  Your body goes through many changes during pregnancy. The changes vary from  woman to woman.  You will have routine prenatal visits. During those visits, your health care provider will examine you, discuss any test results you may have, and talk with you about how you are feeling. This information is not intended to replace advice given to you by your health care provider. Make sure you discuss any questions you have with your health care provider. Document Revised: 09/24/2017 Document Reviewed: 09/23/2016 Elsevier Patient Education  2020 [employer]  

## 2020-01-11 NOTE — Progress Notes (Signed)
Bedside U/S shows single IUP with positive FHT CRL measures 5.34cm  GA is [redacted]w[redacted]d

## 2020-01-11 NOTE — Progress Notes (Signed)
History:   Kelli Miller is a 39 y.o. [phone] at [redacted]w[redacted]d by LMP, early ultrasound being seen today for her first obstetrical visit.  Her obstetrical history is significant for advanced maternal age, obesity and history of DM and CHTN.  Also has history of APLA for which she is on Lovenox.  Had cesarean section for FTP last pregnancy. Patient does intend to breast feed, was not able to do so last time as her "milk never came in". Pregnancy history fully reviewed.  Patient reports no significant concerns.      HISTORY: OB History  Gravida Para Term Preterm AB Living  5 1 1  0 3 1  SAB TAB Ectopic Multiple Live Births  3 0 0 0 1    # Outcome Date GA Lbr Len/2nd Weight Sex Delivery Anes PTL Lv  5 Current           4 Term 06/23/18 [redacted]w[redacted]d  5 lb 15.1 oz (2.695 kg) F CS-LTranv EPI  LIV     Name: Kelli Miller     Apgar1: 9  Apgar5: 9  3 SAB 2015          2 SAB           1 SAB             Last pap smear was done 09/23/2017 and was normal  Past Medical History:  Diagnosis Date  . Antiphospholipid antibody syndrome (HCC)    per pt dx when had hx recurrent miscarriage's  . GERD (gastroesophageal reflux disease)   . History of trichomoniasis 2019  . Hypertension    followed by pcp  . Hypothyroidism    followed by   . PCOS (polycystic ovarian syndrome) 2015  . Psoriasis    dermatologist--- dr 2020 [facility])  . Retained intrauterine contraceptive device (IUD)   . Type 2 diabetes mellitus (HCC)    followed by pcp   Past Surgical History:  Procedure Laterality Date  . CESAREAN SECTION N/A 06/23/2018   Procedure: CESAREAN SECTION;  Surgeon: 06/25/2018, MD;  Location: [facility];  Service: Obstetrics;  Laterality: N/A;  . DILATION AND EVACUATION N/A 04/05/2014   Procedure: DILATATION AND EVACUATION with genetic studies;  Surgeon: 06/05/2014, DO;  Location: [facility];  Service: Gynecology;  Laterality: N/A;  . IUD REMOVAL N/A 08/15/2019   Procedure: INTRAUTERINE DEVICE  (IUD) REMOVAL;  Surgeon: 08/17/2019, MD;  Location: [facility];  Service: Gynecology;  Laterality: N/A;   Family History  Problem Relation Age of Onset  . Lung cancer Father   . Lung cancer Mother   . Heart attack Mother   . Hypertension Mother    Social History   Tobacco Use  . Smoking status: Former Smoker    Years: 10.00    Types: Cigarettes    Quit date: 10/03/2013    Years since quitting: 6.2  . Smokeless tobacco: Never Used  Substance Use Topics  . Alcohol use: No  . Drug use: Never   Allergies  Allergen Reactions  . Bactrim [Sulfamethoxazole-Trimethoprim] Rash   Current Outpatient Medications on File Prior to Visit  Medication Sig Dispense Refill  . amLODipine (NORVASC) 10 MG tablet TAKE ONE TABLET BY MOUTH DAILY 30 tablet 0  . aspirin EC 81 MG tablet Take 81 mg by mouth daily.    14/06/2013 enoxaparin (LOVENOX) 40 MG/0.4ML injection Inject 0.4 mLs (40 mg total) into the skin daily. 40 mL 6  . levothyroxine (SYNTHROID)  50 MCG tablet TAKE 1 TABLET BY MOUTH ONCE DAILY BEFORE BREAKFAST 90 tablet 0  . metFORMIN (GLUCOPHAGE) 1000 MG tablet Take 1 tablet (1,000 mg total) by mouth 2 (two) times daily with a meal. 180 tablet 1  . omeprazole (PRILOSEC) 20 MG capsule Take 1 capsule (20 mg total) by mouth daily. 90 capsule 3  . Prenatal Vit-Fe Fumarate-FA (PRENATAL VITAMIN PO) Take by mouth.    . progesterone (PROMETRIUM) 200 MG capsule Take 1 capsule (200 mg total) by mouth daily. 30 capsule 3  . STELARA 90 MG/ML SOSY injection EVERY 12 WEEKS     No current facility-administered medications on file prior to visit.    Review of Systems Pertinent items noted in HPI and remainder of comprehensive ROS otherwise negative. Physical Exam:   Vitals:   01/11/20 1217  BP: (!) 148/77  Pulse: (!) 107  Temp: 99.4 F (37.4 C)  Weight: 230 lb (104.3 kg)     Uterus:     Pelvic Exam: Perineum: no hemorrhoids, normal perineum   Vulva: normal external genitalia, no lesions     Vagina:  normal mucosa, normal discharge   Cervix: no lesions and normal, pap smear done.    Adnexa: normal adnexa and no mass, fullness, tenderness   Bony Pelvis: average  System: General: well-developed, well-nourished female in no acute distress   Breasts:  normal appearance, no masses or tenderness bilaterally   Skin: normal coloration and turgor, no rashes   Neurologic: oriented, normal, negative, normal mood   Extremities: normal strength, tone, and muscle mass, ROM of all joints is normal   HEENT PERRLA, extraocular movement intact and sclera clear, anicteric   Mouth/Teeth mucous membranes moist, pharynx normal without lesions and dental hygiene good   Neck supple and no masses   Cardiovascular: regular rate and rhythm   Respiratory:  no respiratory distress, normal breath sounds   Abdomen: soft, non-tender; bowel sounds normal; no masses,  no organomegaly     Assessment:    Pregnancy: [id number] [redacted]w[redacted]d Patient Active Problem List   Diagnosis Date Noted  . Preexisting diabetes complicating pregnancy, antepartum 01/11/2020  . Hypothyroid in pregnancy, antepartum 01/11/2020  . History of severe preeclampsia superimposed on chronic hypertension in prior pregnancy 01/11/2020  . History of cesarean section for failure to progress 01/11/2020  . Maternal morbid obesity, antepartum (HCC) 01/11/2020  . Supervision of high risk pregnancy, antepartum 01/01/2020  . Wound of right foot 09/20/2019  . Hypothyroidism 08/30/2018  . Diabetes mellitus (HCC) 08/30/2018  . B12 deficiency 08/30/2018  . Vitamin D deficiency 08/30/2018  . Family history of premature CAD 08/30/2018  . History of antiphospholipid syndrome 08/30/2018  . PCOS (polycystic ovarian syndrome) 01/13/2018  . GERD (gastroesophageal reflux disease) 01/13/2018  . Psoriasis 01/13/2018  . Preexisting hypertension complicating pregnancy, antepartum 12/29/2017  . Advanced maternal age in multigravida 12/27/2017  . Elevated TSH  01/16/2016     Plan:    1. Preexisting diabetes complicating pregnancy, antepartum Patient reported fasting blood sugars of 150s-160s on Metformin 1000 mg po bid.  Recommended insulin initiation for hyperglycemic control, she agreed. Weight based insulun dosing done and prescribed (Humalog and Novolin N preferred by her insurance).  Emphasized importance of compliance in order to prevent maternal-fetal morbidity and mortality. Will get serial scans, antenatal testing later in pregnancy with MFM. MFM referral made today.  Patient also has appointment with Endocrinologist next month, will follow up recommendations. - AMB referral to maternal fetal medicine - [place] MFM Fetal Nuchal  Translucency; Future - [place] MFM OB DETAIL +14 WK; Future - insulin lispro (HUMALOG) 100 UNIT/ML injection; Take 18 units subcutaneously with breakfast and 14 units with dinner  Dispense: 10 mL; Refill: 11 - insulin NPH Human (NOVOLIN N) 100 UNIT/ML injection; Take 38 units subcutaneously with breakfast and 14 units at bedtime  Dispense: 10 mL; Refill: 3  2. Preexisting hypertension complicating pregnancy, antepartum 3. History of severe preeclampsia superimposed on chronic hypertension in prior pregnancy Continue Norvasc for now.  MFM referral made.  Emphasized importance of compliance with therapy and BP control in order to prevent maternal-fetal morbidity and mortality. Continue ASA.  - AMB referral to maternal fetal medicine - [place] MFM Fetal Nuchal Translucency; Future - [place] MFM OB DETAIL +14 WK; Future  4. Maternal morbid obesity, antepartum ([place]) Will meet with Nutritionist, plan for 11-20 lb weight gain during pregnancy  5. History of cesarean section for failure to progress Patient will decide on mode of delivery later in pregnancy.   6. Hypothyroid in pregnancy, antepartum Continue Levothyroxine, check TSH at least every trimester.   7. History of antiphospholipid syndrome Continue Lovenox for now. - AMB  referral to maternal fetal medicine - [place] MFM Fetal Nuchal Translucency; Future - [place] MFM OB DETAIL +14 WK; Future  8. Multigravida of advanced maternal age in first trimester Interested in first trimester screening/NIPS. Referred to MFM.  - AMB referral to maternal fetal medicine - [place] MFM Fetal Nuchal Translucency; Future - [place] MFM OB DETAIL +14 WK; Future  9. Supervision of high risk pregnancy, antepartum - Cytology - PAP( [place]) - Culture, OB Urine - Obstetric panel - HIV antibody (with reflex) - HgB A1c - TSH - Hepatitis C antibody - Comprehensive metabolic panel - Protein / creatinine ratio, urine - Babyscripts Schedule Optimization - AMB referral to maternal fetal medicine - [place] MFM Fetal Nuchal Translucency; Future - [place] MFM OB DETAIL +14 WK; Future Initial labs drawn. Continue prenatal vitamins. Ultrasound discussed; fetal anatomic survey: ordered. Problem list reviewed and updated. The nature of [facility] with multiple MDs and other Advanced Practice Providers was explained to patient; also emphasized that residents, students are part of our team. Routine obstetric precautions reviewed. Return in about 2 weeks (around 01/25/2020) for OFFICE OB Visit and review blood sugars.     Verita Schneiders, MD, [place] for [employer], [place]

## 2020-01-12 LAB — OBSTETRIC PANEL
Absolute Monocytes: 690 cells/uL (ref 200–950)
Antibody Screen: NOT DETECTED
Basophils Absolute: 69 cells/uL (ref 0–200)
Basophils Relative: 0.6 %
Eosinophils Absolute: 115 cells/uL (ref 15–500)
Eosinophils Relative: 1 %
HCT: 40.7 % (ref 35.0–45.0)
Hemoglobin: 13.9 g/dL (ref 11.7–15.5)
Hepatitis B Surface Ag: NONREACTIVE
Lymphs Abs: 2323 cells/uL (ref 850–3900)
MCH: 30.8 pg (ref 27.0–33.0)
MCHC: 34.2 g/dL (ref 32.0–36.0)
MCV: 90.2 fL (ref 80.0–100.0)
MPV: 10 fL (ref 7.5–12.5)
Monocytes Relative: 6 %
Neutro Abs: 8303 cells/uL — ABNORMAL HIGH (ref 1500–7800)
Neutrophils Relative %: 72.2 %
Platelets: 320 10*3/uL (ref 140–400)
RBC: 4.51 10*6/uL (ref 3.80–5.10)
RDW: 12.6 % (ref 11.0–15.0)
RPR Ser Ql: NONREACTIVE
Rubella: 7.13 Index
Total Lymphocyte: 20.2 %
WBC: 11.5 10*3/uL — ABNORMAL HIGH (ref 3.8–10.8)

## 2020-01-12 LAB — COMPREHENSIVE METABOLIC PANEL
AG Ratio: 1.4 (calc) (ref 1.0–2.5)
ALT: 15 U/L (ref 6–29)
AST: 9 U/L — ABNORMAL LOW (ref 10–30)
Albumin: 4.1 g/dL (ref 3.6–5.1)
Alkaline phosphatase (APISO): 43 U/L (ref 31–125)
BUN: 8 mg/dL (ref 7–25)
CO2: 23 mmol/L (ref 20–32)
Calcium: 9.7 mg/dL (ref 8.6–10.2)
Chloride: 103 mmol/L (ref 98–110)
Creat: 0.54 mg/dL (ref 0.50–1.10)
Globulin: 2.9 g/dL (calc) (ref 1.9–3.7)
Glucose, Bld: 135 mg/dL — ABNORMAL HIGH (ref 65–99)
Potassium: 3.7 mmol/L (ref 3.5–5.3)
Sodium: 136 mmol/L (ref 135–146)
Total Bilirubin: 0.4 mg/dL (ref 0.2–1.2)
Total Protein: 7 g/dL (ref 6.1–8.1)

## 2020-01-12 LAB — HIV ANTIBODY (ROUTINE TESTING W REFLEX): HIV 1&2 Ab, 4th Generation: NONREACTIVE

## 2020-01-12 LAB — HEMOGLOBIN A1C
Hgb A1c MFr Bld: 5.5 % of total Hgb (ref <5.7)
Mean Plasma Glucose: 111 (calc)
eAG (mmol/L): 6.2 (calc)

## 2020-01-12 LAB — HEPATITIS C ANTIBODY
Hepatitis C Ab: NONREACTIVE
SIGNAL TO CUT-OFF: 0.02 (ref <1.00)

## 2020-01-12 LAB — TSH: TSH: 1.77 mIU/L

## 2020-01-12 LAB — PROTEIN / CREATININE RATIO, URINE
Creatinine, Urine: 114 mg/dL (ref 20–275)
Protein/Creat Ratio: 96 mg/g creat (ref 21–161)
Protein/Creatinine Ratio: 0.096 mg/mg creat (ref 0.021–0.16)
Total Protein, Urine: 11 mg/dL (ref 5–24)

## 2020-01-13 LAB — CYTOLOGY - PAP
Chlamydia: NEGATIVE
Comment: NEGATIVE
Comment: NEGATIVE
Comment: NORMAL
Diagnosis: NEGATIVE
Diagnosis: REACTIVE
High risk HPV: NEGATIVE
Neisseria Gonorrhea: NEGATIVE

## 2020-01-13 LAB — URINE CULTURE, OB REFLEX

## 2020-01-13 LAB — CULTURE, OB URINE

## 2020-01-25 ENCOUNTER — Other Ambulatory Visit: Payer: Self-pay | Admitting: Osteopathic Medicine

## 2020-01-25 ENCOUNTER — Ambulatory Visit (INDEPENDENT_AMBULATORY_CARE_PROVIDER_SITE_OTHER): Payer: PRIVATE HEALTH INSURANCE | Admitting: Obstetrics & Gynecology

## 2020-01-25 ENCOUNTER — Other Ambulatory Visit: Payer: Self-pay

## 2020-01-25 VITALS — BP 129/73 | HR 97 | Temp 98.4°F | Wt 231.0 lb

## 2020-01-25 DIAGNOSIS — O09299 Supervision of pregnancy with other poor reproductive or obstetric history, unspecified trimester: Secondary | ICD-10-CM

## 2020-01-25 DIAGNOSIS — O99210 Obesity complicating pregnancy, unspecified trimester: Secondary | ICD-10-CM

## 2020-01-25 DIAGNOSIS — O0990 Supervision of high risk pregnancy, unspecified, unspecified trimester: Secondary | ICD-10-CM

## 2020-01-25 DIAGNOSIS — Z98891 History of uterine scar from previous surgery: Secondary | ICD-10-CM

## 2020-01-25 DIAGNOSIS — O09529 Supervision of elderly multigravida, unspecified trimester: Secondary | ICD-10-CM

## 2020-01-25 DIAGNOSIS — E039 Hypothyroidism, unspecified: Secondary | ICD-10-CM

## 2020-01-25 DIAGNOSIS — O99280 Endocrine, nutritional and metabolic diseases complicating pregnancy, unspecified trimester: Secondary | ICD-10-CM

## 2020-01-25 DIAGNOSIS — O10919 Unspecified pre-existing hypertension complicating pregnancy, unspecified trimester: Secondary | ICD-10-CM

## 2020-01-25 DIAGNOSIS — Z862 Personal history of diseases of the blood and blood-forming organs and certain disorders involving the immune mechanism: Secondary | ICD-10-CM

## 2020-01-25 DIAGNOSIS — O24319 Unspecified pre-existing diabetes mellitus in pregnancy, unspecified trimester: Secondary | ICD-10-CM

## 2020-01-25 NOTE — Progress Notes (Signed)
   PRENATAL VISIT NOTE  Subjective:  Kelli Miller is a 39 y.o. [phone] at [redacted]w[redacted]d being seen today for ongoing prenatal care.  She is currently monitored for the following issues for this high-risk pregnancy and has Advanced maternal age in multigravida; Preexisting hypertension complicating pregnancy, antepartum; PCOS (polycystic ovarian syndrome); GERD (gastroesophageal reflux disease); Psoriasis; Elevated TSH; Hypothyroidism; Diabetes mellitus (HCC); B12 deficiency; Vitamin D deficiency; Family history of premature CAD; History of antiphospholipid syndrome; Wound of right foot; Supervision of high risk pregnancy, antepartum; Preexisting diabetes complicating pregnancy, antepartum; Hypothyroid in pregnancy, antepartum; History of severe preeclampsia superimposed on chronic hypertension in prior pregnancy; History of cesarean section for failure to progress; and Maternal morbid obesity, antepartum (HCC) on their problem list.  Patient reports no complaints.Here today to review glucose log. Contractions: Not present. Vag. Bleeding: None.  Movement: Absent. Denies leaking of fluid.   The following portions of the patient's history were reviewed and updated as appropriate: allergies, current medications, past family history, past medical history, past social history, past surgical history and problem list.   Objective:   Vitals:   01/25/20 1413  BP: 129/73  Pulse: 97  Temp: 98.4 F (36.9 C)  Weight: 231 lb (104.8 kg)    Fetal Status: Fetal Heart Rate (bpm): 175   Movement: Absent     General:  Alert, oriented and cooperative. Patient is in no acute distress.  Skin: Skin is warm and dry. No rash noted.   Cardiovascular: Normal heart rate noted  Respiratory: Normal respiratory effort, no problems with respiration noted  Abdomen: Soft, gravid, appropriate for gestational age.  Pain/Pressure: Absent     Pelvic: Cervical exam deferred        Extremities: Normal range of motion.  Edema: None    Mental Status: Normal mood and affect. Normal behavior. Normal judgment and thought content.   Assessment and Plan:  Pregnancy: [id number] at [redacted]w[redacted]d 1. Supervision of high risk pregnancy, antepartum  2. Preexisting hypertension complicating pregnancy, antepartum  3. Preexisting diabetes complicating pregnancy, antepartum Pts fasting glucose is 110-166 She has been taking her postprndial at different time sometimes 1 and sometime 2 hours. The ranges I 91-196 Her HgbA1c on 3/18 was 5.5%  Will adjust her insulin in the evening to 18/18 Novalog and Novalin.   Pt will begin walking daily .She walks her dog per day. She was encouraged to increase that time.   F/u in 2 weeks to view Glc.    4. Maternal morbid obesity, antepartum (HCC)  5. APLa  On Lovenox  Preterm labor symptoms and general obstetric precautions including but not limited to vaginal bleeding, contractions, leaking of fluid and fetal movement were reviewed in detail with the patient. Please refer to After Visit Summary for other counseling recommendations.   Return in about 4 weeks (around 02/22/2020) for in person.  No future appointments.  Willodean Rosenthal, MD

## 2020-01-25 NOTE — Patient Instructions (Signed)

## 2020-01-30 ENCOUNTER — Telehealth: Payer: Self-pay | Admitting: Osteopathic Medicine

## 2020-02-01 NOTE — Telephone Encounter (Signed)
Called pharmacy and updated them.  Kaliya Shreiner,CMA

## 2020-02-01 NOTE — Telephone Encounter (Signed)
Pharmacy is needing authorization to switch manufacturers for levothyroxine to Euthyrox.  Will send to pcp.  Bransyn Adami,CMA

## 2020-02-01 NOTE — Telephone Encounter (Signed)
Yes

## 2020-02-09 ENCOUNTER — Other Ambulatory Visit: Payer: Self-pay

## 2020-02-09 ENCOUNTER — Ambulatory Visit (INDEPENDENT_AMBULATORY_CARE_PROVIDER_SITE_OTHER): Payer: PRIVATE HEALTH INSURANCE | Admitting: Family Medicine

## 2020-02-09 ENCOUNTER — Encounter: Payer: Self-pay | Admitting: Family Medicine

## 2020-02-09 DIAGNOSIS — M25572 Pain in left ankle and joints of left foot: Secondary | ICD-10-CM

## 2020-02-12 ENCOUNTER — Encounter: Payer: Self-pay | Admitting: Family Medicine

## 2020-02-12 ENCOUNTER — Ambulatory Visit (INDEPENDENT_AMBULATORY_CARE_PROVIDER_SITE_OTHER): Payer: PRIVATE HEALTH INSURANCE | Admitting: Obstetrics & Gynecology

## 2020-02-12 ENCOUNTER — Other Ambulatory Visit: Payer: Self-pay

## 2020-02-12 VITALS — BP 149/85 | HR 104 | Temp 100.0°F | Wt 231.0 lb

## 2020-02-12 DIAGNOSIS — O10919 Unspecified pre-existing hypertension complicating pregnancy, unspecified trimester: Secondary | ICD-10-CM

## 2020-02-12 DIAGNOSIS — Z98891 History of uterine scar from previous surgery: Secondary | ICD-10-CM

## 2020-02-12 DIAGNOSIS — O24319 Unspecified pre-existing diabetes mellitus in pregnancy, unspecified trimester: Secondary | ICD-10-CM

## 2020-02-12 DIAGNOSIS — O09529 Supervision of elderly multigravida, unspecified trimester: Secondary | ICD-10-CM

## 2020-02-12 DIAGNOSIS — M25572 Pain in left ankle and joints of left foot: Secondary | ICD-10-CM | POA: Insufficient documentation

## 2020-02-12 DIAGNOSIS — Z862 Personal history of diseases of the blood and blood-forming organs and certain disorders involving the immune mechanism: Secondary | ICD-10-CM

## 2020-02-12 MED ORDER — INSULIN NPH (HUMAN) (ISOPHANE) 100 UNIT/ML ~~LOC~~ SUSP: SUBCUTANEOUS | 3 refills | Status: DC

## 2020-02-12 NOTE — Progress Notes (Signed)
PRENATAL VISIT NOTE  Subjective:  Kelli Miller is a 39 y.o. [phone] at [redacted]w[redacted]d being seen today for ongoing prenatal care.  She is currently monitored for the following issues for this high-risk pregnancy and has Advanced maternal age in multigravida; Preexisting hypertension complicating pregnancy, antepartum; PCOS (polycystic ovarian syndrome); GERD (gastroesophageal reflux disease); Psoriasis; Hypothyroidism; Diabetes mellitus (HCC); B12 deficiency; Vitamin D deficiency; Family history of premature CAD; History of antiphospholipid syndrome; Supervision of high risk pregnancy, antepartum; Preexisting diabetes complicating pregnancy, antepartum; Hypothyroid in pregnancy, antepartum; History of severe preeclampsia superimposed on chronic hypertension in prior pregnancy; History of cesarean section for failure to progress; Maternal morbid obesity, antepartum (HCC); and Left ankle pain on their problem list.  Patient reports no complaints.  Contractions: Not present. Vag. Bleeding: None.  Movement: Absent. Denies leaking of fluid.   The following portions of the patient's history were reviewed and updated as appropriate: allergies, current medications, past family history, past medical history, past social history, past surgical history and problem list.   Objective:   Vitals:   02/12/20 1550  BP: (!) 149/85  Pulse: (!) 104  Temp: 100 F (37.8 C)  Weight: 231 lb (104.8 kg)    Fetal Status: Fetal Heart Rate (bpm): Pos FHT   Movement: Absent     General:  Alert, oriented and cooperative. Patient is in no acute distress.  Skin: Skin is warm and dry. No rash noted.   Cardiovascular: Normal heart rate noted  Respiratory: Normal respiratory effort, no problems with respiration noted  Abdomen: Soft, gravid, appropriate for gestational age.  Pain/Pressure: Absent     Pelvic: Cervical exam deferred        Extremities: Normal range of motion.  Edema: None  Mental Status: Normal mood and  affect. Normal behavior. Normal judgment and thought content.   Assessment and Plan:  Pregnancy: G5P1031 at [redacted]w[redacted]d 1. Preexisting diabetes complicating pregnancy, antepartum  fastings 103-132; increase to 20 units NPH at night   Not checking 1 hr pp after breakfast or lunch.  1 hr pp diner is mostly <140.    - insulin NPH Human (NOVOLIN N) 100 UNIT/ML injection; Take 38 units subcutaneously with breakfast and 20 units at bedtime  Dispense: 10 mL; Refill: 3 Continue metformin Continue humalog  Encouraged to take CBGs 4x a day and record. Virtual visit in 1 week to review CBGs.  2. Antepartum multigravida of advanced maternal age -Declines NIPS at this time.  May go to MFM for nuchal and further discussion with MFM about testing.  She knows she can contact us at anytime to have testing.    3. Preexisting hypertension complicating pregnancy, antepartum Babyscripts data--nml BPS (a little elevated today--BP taken after she walked into the office.)  Continue Norvasc daily.  Continue baby ASA  4. History of antiphospholipid syndrome Continue Lovenox.  5. History of cesarean section for failure to progress Pt plans for rpt c/s and BTL  Preterm labor symptoms and general obstetric precautions including but not limited to vaginal bleeding, contractions, leaking of fluid and fetal movement were reviewed in detail with the patient. Please refer to After Visit Summary for other counseling recommendations.   Return in about 1 week (around 02/19/2020).  Future Appointments  Date Time Provider Department Center  02/19/2020  1:15 PM Lesly Dukes, MD [facility]  02/29/2020  9:30 AM [facility] LAB [facility]  02/29/2020  9:30 AM [facility] [place] 1 [facility]  02/29/2020 10:30 AM [facility] GENETIC COUNSELING RM  [place] [facility]  04/08/2020 10:30 AM [facility] [place] 3 [facility]    Silas [place], MD

## 2020-02-12 NOTE — Assessment & Plan Note (Addendum)
Seems to be due to posterior tibial dysfunction/tendintis.  Given rehab exercises and theraband Recommend icing and or heat.  Currently pregnant but can consider use of voltaren gel since <28 weeks. F/u in 2-3 weeks if not improving.

## 2020-02-12 NOTE — Progress Notes (Signed)
Kelli Miller - 39 y.o. female MRN [id number]  Date of birth: 16-May-1981  Subjective Chief Complaint  Patient presents with  . Ankle Pain    left    HPI Kelli Miller is a 39 y.o. female here today with complaint of L ankle pain.  She reports that pain started a few weeks ago.  Improved a few days after initial onset but returned a few days ago.  She does not recall any injury or overuse.  Pain located  Across the top and medial portion of foot.  No swelling, numbness or tingling.  She is ~[redacted] weeks pregnant.  She has not tried anything for treatment so far.  ROS:  A comprehensive ROS was completed and negative except as noted per HPI   Allergies  Allergen Reactions  . Bactrim [Sulfamethoxazole-Trimethoprim] Rash    Past Medical History:  Diagnosis Date  . Antiphospholipid antibody syndrome (HCC)    per pt dx when had hx recurrent miscarriage's  . GERD (gastroesophageal reflux disease)   . History of trichomoniasis 2019  . Hypertension    followed by pcp  . Hypothyroidism    followed by   . PCOS (polycystic ovarian syndrome) 2015  . Psoriasis    dermatologist--- dr Rickard [facility])  . Retained intrauterine contraceptive device (IUD)   . Type 2 diabetes mellitus (HCC)    followed by pcp    Past Surgical History:  Procedure Laterality Date  . CESAREAN SECTION N/A 06/23/2018   Procedure: CESAREAN SECTION;  Surgeon: Tereso Newcomer, MD;  [facility];  Service: Obstetrics;  Laterality: N/A;  . DILATION AND EVACUATION N/A 04/05/2014   Procedure: DILATATION AND EVACUATION with genetic studies;  Surgeon: Mitchel Honour, DO;  Location: [facility];  Service: Gynecology;  Laterality: N/A;  . IUD REMOVAL N/A 08/15/2019   Procedure: INTRAUTERINE DEVICE (IUD) REMOVAL;  Surgeon: Allie Bossier, MD;  Location: [facility];  Service: Gynecology;  Laterality: N/A;    Social History   Socioeconomic History  . Marital status: Married    Spouse name: Not on file   . Number of children: Not on file  . Years of education: Not on file  . Highest education level: Not on file  Occupational History  . Occupation: Nurse  Tobacco Use  . Smoking status: Former Smoker    Years: 10.00    Types: Cigarettes    Quit date: 10/03/2013    Years since quitting: 6.3  . Smokeless tobacco: Never Used  Substance and Sexual Activity  . Alcohol use: No  . Drug use: Never  . Sexual activity: Yes    Birth control/protection: None  Other Topics Concern  . Not on file  Social History Narrative  . Not on file   Social Determinants of Health   Financial Resource Strain:   . Difficulty of Paying Living Expenses:   Food Insecurity:   . Worried About Programme researcher, broadcasting/film/video in the Last Year:   . Barista in the Last Year:   Transportation Needs:   . Freight forwarder (Medical):   Marland Kitchen Lack of Transportation (Non-Medical):   Physical Activity:   . Days of Exercise per Week:   . Minutes of Exercise per Session:   Stress:   . Feeling of Stress :   Social Connections:   . Frequency of Communication with Friends and Family:   . Frequency of Social Gatherings with Friends and Family:   . Attends Religious Services:   .  Active Member of Clubs or Organizations:   . Attends Archivist Meetings:   Marland Kitchen Marital Status:     Family History  Problem Relation Age of Onset  . Lung cancer Father   . Lung cancer Mother   . Heart attack Mother   . Hypertension Mother     Health Maintenance  Topic Date Due  . PNEUMOCOCCAL POLYSACCHARIDE VACCINE AGE 63-64 HIGH RISK  Never done  . OPHTHALMOLOGY EXAM  Never done  . URINE MICROALBUMIN  Never done  . COVID-19 Vaccine (1) Never done  . INFLUENZA VACCINE  05/26/2020  . HEMOGLOBIN A1C  07/13/2020  . FOOT EXAM  09/19/2020  . PAP SMEAR-Modifier  01/11/2023  . TETANUS/TDAP  04/26/2028  . HIV Screening  Completed      ----------------------------------------------------------------------------------------------------------------------------------------------------------------------------------------------------------------- Physical Exam BP 133/84   Pulse 97   Ht 5\' 1"  (1.549 m)   Wt 231 lb (104.8 kg)   LMP 11/27/2019   BMI 43.65 kg/m   Physical Exam Constitutional:      Appearance: Normal appearance.  HENT:     Head: Normocephalic and atraumatic.  Musculoskeletal:     Cervical back: Neck supple.     Comments: Ankle normal to inspection and palpation. No swelling noted. ROM is normal.  Resisted eversion/inversion normal. Normal gait.  Pain with standing on tip toes.   Skin:    General: Skin is warm and dry.  Neurological:     General: No focal deficit present.     Mental Status: She is alert.  Psychiatric:        Mood and Affect: Mood normal.        Behavior: Behavior normal.     ------------------------------------------------------------------------------------------------------------------------------------------------------------------------------------------------------------------- Assessment and Plan  Left ankle pain Seems to be due to posterior tibial dysfunction/tendintis.  Given rehab exercises and theraband Recommend icing and or heat.  Currently pregnant but can consider use of voltaren gel since <28 weeks. F/u in 2-3 weeks if not improving.      No orders of the defined types were placed in this encounter.   No follow-ups on file.    This visit occurred during the SARS-CoV-2 public health emergency.  Safety protocols were in place, including screening questions prior to the visit, additional usage of staff PPE, and extensive cleaning of exam room while observing appropriate contact time as indicated for disinfecting solutions.

## 2020-02-19 ENCOUNTER — Telehealth (INDEPENDENT_AMBULATORY_CARE_PROVIDER_SITE_OTHER): Payer: PRIVATE HEALTH INSURANCE | Admitting: Obstetrics & Gynecology

## 2020-02-19 ENCOUNTER — Encounter: Payer: Self-pay | Admitting: Obstetrics & Gynecology

## 2020-02-19 DIAGNOSIS — O24319 Unspecified pre-existing diabetes mellitus in pregnancy, unspecified trimester: Secondary | ICD-10-CM

## 2020-02-19 DIAGNOSIS — O24311 Unspecified pre-existing diabetes mellitus in pregnancy, first trimester: Secondary | ICD-10-CM | POA: Diagnosis not present

## 2020-02-19 DIAGNOSIS — O0990 Supervision of high risk pregnancy, unspecified, unspecified trimester: Secondary | ICD-10-CM

## 2020-02-19 DIAGNOSIS — Z3A12 12 weeks gestation of pregnancy: Secondary | ICD-10-CM | POA: Diagnosis not present

## 2020-02-22 ENCOUNTER — Encounter: Payer: Self-pay | Admitting: *Deleted

## 2020-02-22 ENCOUNTER — Other Ambulatory Visit: Payer: Self-pay | Admitting: *Deleted

## 2020-02-22 DIAGNOSIS — O24319 Unspecified pre-existing diabetes mellitus in pregnancy, unspecified trimester: Secondary | ICD-10-CM

## 2020-02-22 NOTE — Progress Notes (Signed)
I connected with@ on 02/22/20 at  1:15 PM EDT by: Mychart and verified that I am speaking with the correct person using two identifiers.  Patient is located in her car and provider is located at [place].     The purpose of this virtual visit is to provide medical care while limiting exposure to the novel coronavirus. I discussed the limitations, risks, security and privacy concerns of performing an evaluation and management service by mychart and the availability of in person appointments. I also discussed with the patient that there may be a patient responsible charge related to this service. By engaging in this virtual visit, you consent to the provision of healthcare.  Additionally, you authorize for your insurance to be billed for the services provided during this visit.  The patient expressed understanding and agreed to proceed.     PRENATAL VISIT NOTE  Subjective:  Kelli Miller is a 39 y.o. [id number] at [redacted]w[redacted]d  for phone visit for discussion of her CBGs .  Patient reports no complaints.  Contractions: Not present. Vag. Bleeding: None.  Movement: Absent. Denies leaking of fluid.   The following portions of the patient's history were reviewed and updated as appropriate: allergies, current medications, past family history, past medical history, past social history, past surgical history and problem list.   Objective:   Vitals:   02/19/20 1148  BP: 129/79  Pulse: 98  Weight: 227 lb (103 kg)   Self-Obtained  Fetal Status:     Movement: Absent     Assessment and Plan:  Pregnancy: G5P1031 at [redacted]w[redacted]d 1. Preexisting diabetes complicating pregnancy, antepartum Fasting CBGs are higher after increasing evening NPH last week.  Patient to check 1-2 am blood sugars to see if she is dropping in the middle of the night.  Earline frequently skips breakfast due to nausea and doesn't eat until 1 pm on workdays (She is an Charity fundraiser).  She takes her NPH on these mornings but not her Novolog.  We discussed  long periods of fasting is not recommended and makes it hrad to manage her glucose levels.  Pt has endocrinologist has she states has opted not to start her on an insulin pump.   Pt to send me her CBGs (including 1 am levels) by EOB Thursday so we can determine if we need to change insulin dosages.  Preterm labor symptoms and general obstetric precautions including but not limited to vaginal bleeding, contractions, leaking of fluid and fetal movement were reviewed in detail with the patient.  No follow-ups on file.  Future Appointments  Date Time Provider Department Center  02/29/2020  9:30 AM [facility] LAB [facility]  02/29/2020  9:30 AM [facility] [place] 1 [facility]  02/29/2020 10:30 AM [facility] GENETIC COUNSELING RM [facility]  03/07/2020  3:45 PM Conan Bowens, MD [facility]  04/08/2020 10:30 AM [facility] [place] 3 [facility]     Time spent on virtual visit: 15 minutes  Elsie Lincoln, MD

## 2020-02-26 ENCOUNTER — Other Ambulatory Visit: Payer: Self-pay

## 2020-02-26 ENCOUNTER — Ambulatory Visit (HOSPITAL_BASED_OUTPATIENT_CLINIC_OR_DEPARTMENT_OTHER): Payer: PRIVATE HEALTH INSURANCE | Admitting: Genetic Counselor

## 2020-02-26 ENCOUNTER — Ambulatory Visit (HOSPITAL_COMMUNITY): Payer: PRIVATE HEALTH INSURANCE | Attending: Obstetrics and Gynecology

## 2020-02-26 ENCOUNTER — Other Ambulatory Visit: Payer: Self-pay | Admitting: *Deleted

## 2020-02-26 ENCOUNTER — Ambulatory Visit: Payer: PRIVATE HEALTH INSURANCE

## 2020-02-26 ENCOUNTER — Ambulatory Visit: Payer: PRIVATE HEALTH INSURANCE | Admitting: *Deleted

## 2020-02-26 ENCOUNTER — Ambulatory Visit: Payer: Self-pay | Admitting: Genetic Counselor

## 2020-02-26 DIAGNOSIS — E039 Hypothyroidism, unspecified: Secondary | ICD-10-CM | POA: Diagnosis not present

## 2020-02-26 DIAGNOSIS — O09521 Supervision of elderly multigravida, first trimester: Secondary | ICD-10-CM | POA: Insufficient documentation

## 2020-02-26 DIAGNOSIS — O99211 Obesity complicating pregnancy, first trimester: Secondary | ICD-10-CM | POA: Insufficient documentation

## 2020-02-26 DIAGNOSIS — Z794 Long term (current) use of insulin: Secondary | ICD-10-CM | POA: Insufficient documentation

## 2020-02-26 DIAGNOSIS — O10911 Unspecified pre-existing hypertension complicating pregnancy, first trimester: Secondary | ICD-10-CM | POA: Diagnosis not present

## 2020-02-26 DIAGNOSIS — O10919 Unspecified pre-existing hypertension complicating pregnancy, unspecified trimester: Secondary | ICD-10-CM

## 2020-02-26 DIAGNOSIS — Z315 Encounter for genetic counseling: Secondary | ICD-10-CM

## 2020-02-26 DIAGNOSIS — O2691 Pregnancy related conditions, unspecified, first trimester: Secondary | ICD-10-CM

## 2020-02-26 DIAGNOSIS — Z3A13 13 weeks gestation of pregnancy: Secondary | ICD-10-CM

## 2020-02-26 DIAGNOSIS — Z3689 Encounter for other specified antenatal screening: Secondary | ICD-10-CM | POA: Diagnosis present

## 2020-02-26 DIAGNOSIS — Z79899 Other long term (current) drug therapy: Secondary | ICD-10-CM | POA: Diagnosis not present

## 2020-02-26 DIAGNOSIS — O99281 Endocrine, nutritional and metabolic diseases complicating pregnancy, first trimester: Secondary | ICD-10-CM

## 2020-02-26 DIAGNOSIS — E119 Type 2 diabetes mellitus without complications: Secondary | ICD-10-CM | POA: Diagnosis not present

## 2020-02-26 DIAGNOSIS — O34219 Maternal care for unspecified type scar from previous cesarean delivery: Secondary | ICD-10-CM | POA: Diagnosis not present

## 2020-02-26 DIAGNOSIS — O0990 Supervision of high risk pregnancy, unspecified, unspecified trimester: Secondary | ICD-10-CM

## 2020-02-26 DIAGNOSIS — O0991 Supervision of high risk pregnancy, unspecified, first trimester: Secondary | ICD-10-CM | POA: Insufficient documentation

## 2020-02-26 DIAGNOSIS — Z7901 Long term (current) use of anticoagulants: Secondary | ICD-10-CM | POA: Diagnosis not present

## 2020-02-26 DIAGNOSIS — Z862 Personal history of diseases of the blood and blood-forming organs and certain disorders involving the immune mechanism: Secondary | ICD-10-CM

## 2020-02-26 DIAGNOSIS — O10011 Pre-existing essential hypertension complicating pregnancy, first trimester: Secondary | ICD-10-CM | POA: Diagnosis not present

## 2020-02-26 DIAGNOSIS — O24311 Unspecified pre-existing diabetes mellitus in pregnancy, first trimester: Secondary | ICD-10-CM | POA: Diagnosis not present

## 2020-02-26 DIAGNOSIS — O24319 Unspecified pre-existing diabetes mellitus in pregnancy, unspecified trimester: Secondary | ICD-10-CM

## 2020-02-26 DIAGNOSIS — D6861 Antiphospholipid syndrome: Secondary | ICD-10-CM

## 2020-02-26 IMAGING — US US MFM FETAL NUCHAL TRANSLUCENCY
1 series · 14 of 28 positions shown · non-contrast
Comparison: none

[Series 1: us mfm fetal nuchal translucency · 14 of 60 slices shown]
[im 3/60]
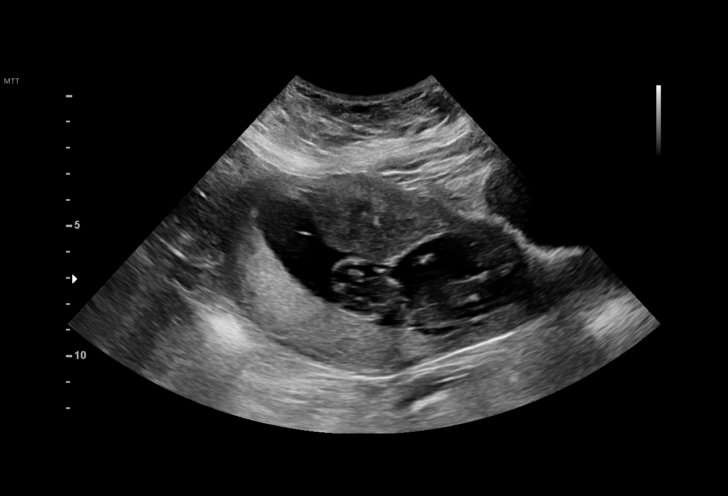
[im 7/60]
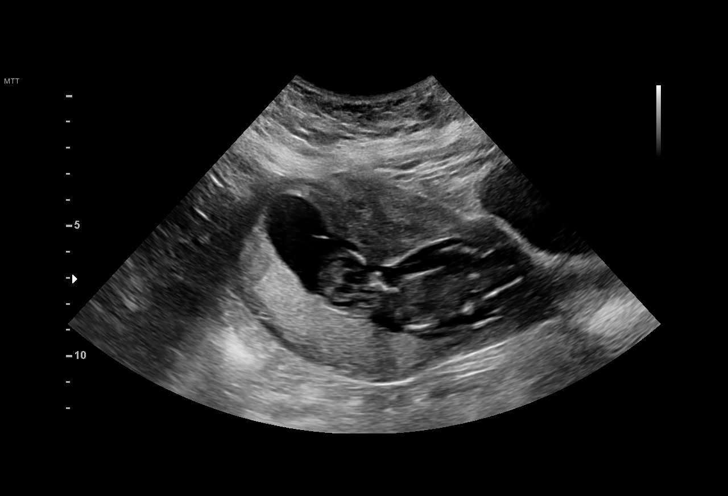
[im 11/60]
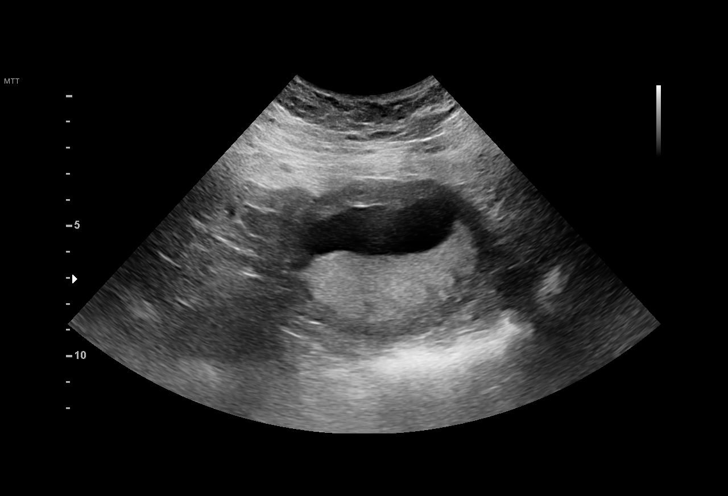
[im 16/60]
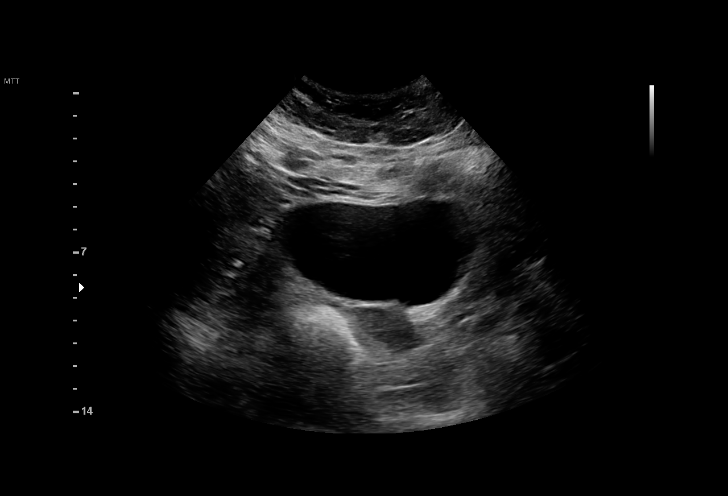
[im 20/60]
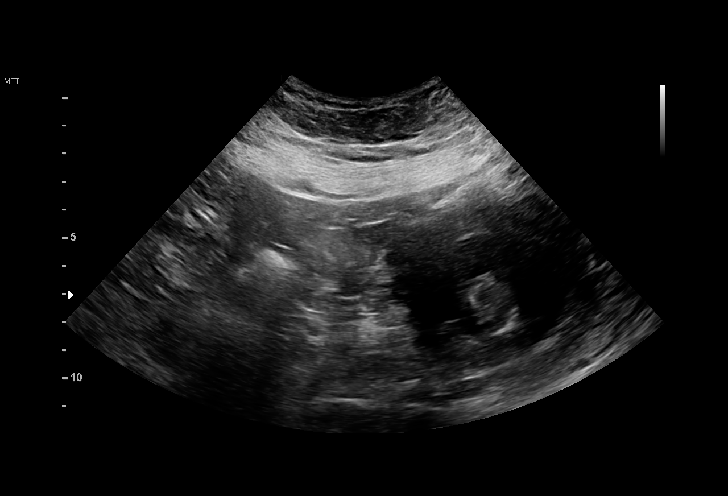
[im 25/60]
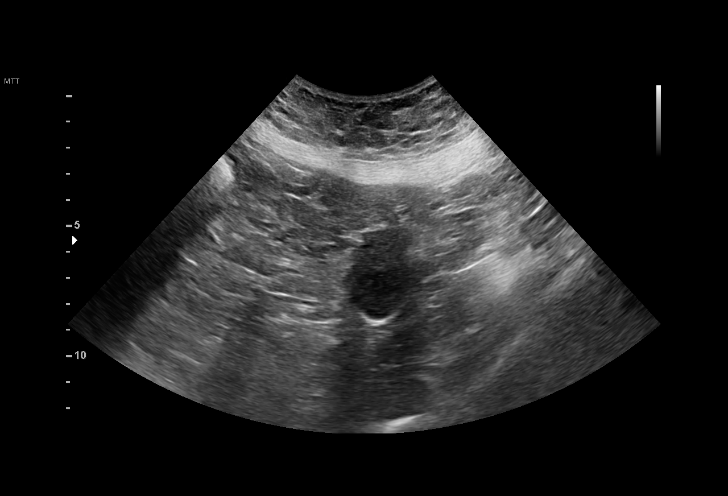
[im 29/60]
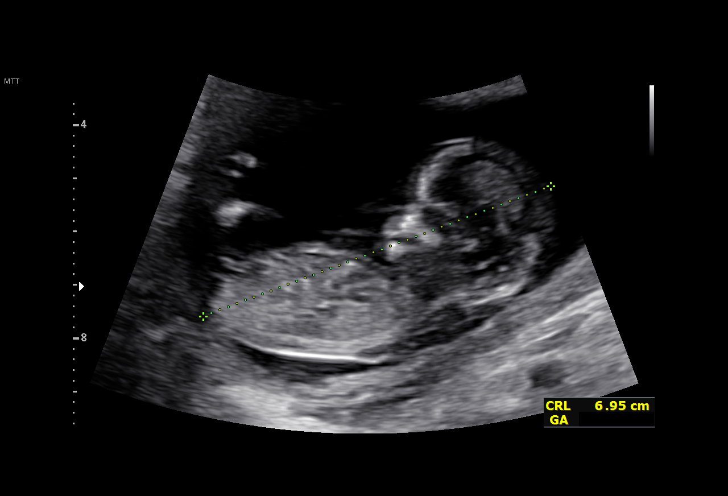
[im 33/60]
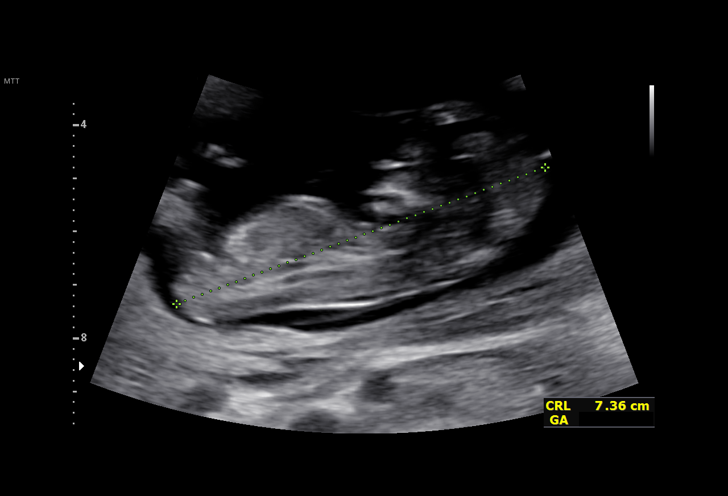
[im 38/60]
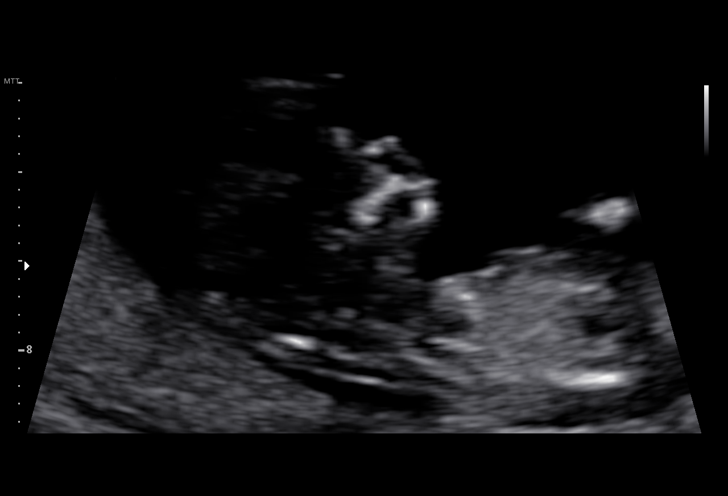
[im 42/60]
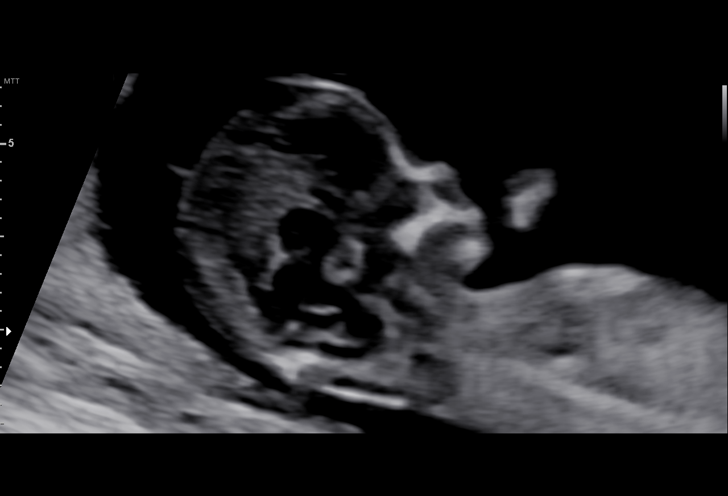
[im 46/60]
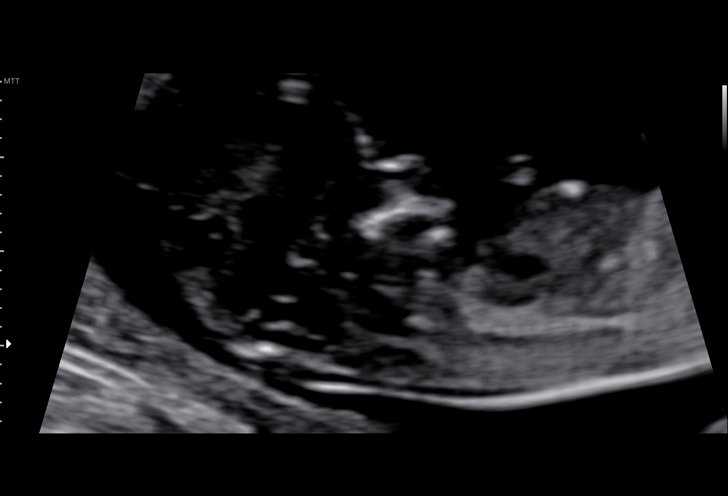
[im 51/60]
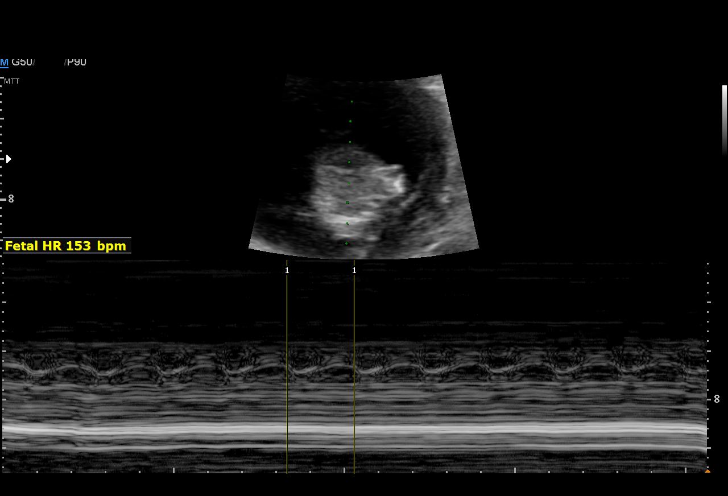
[im 55/60]
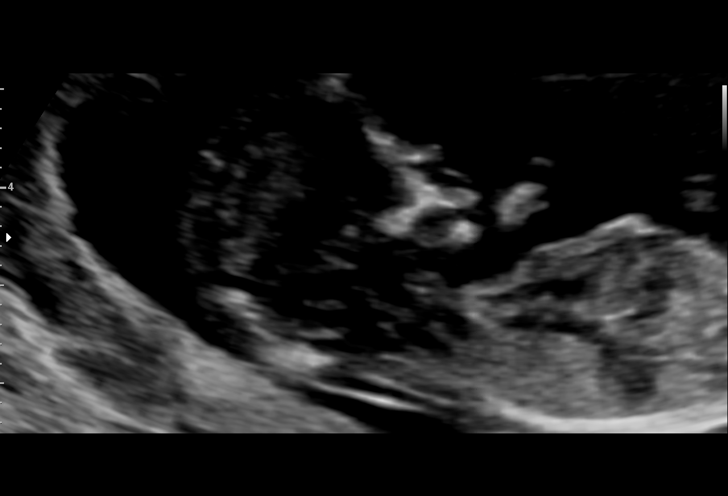
[im 60/60]
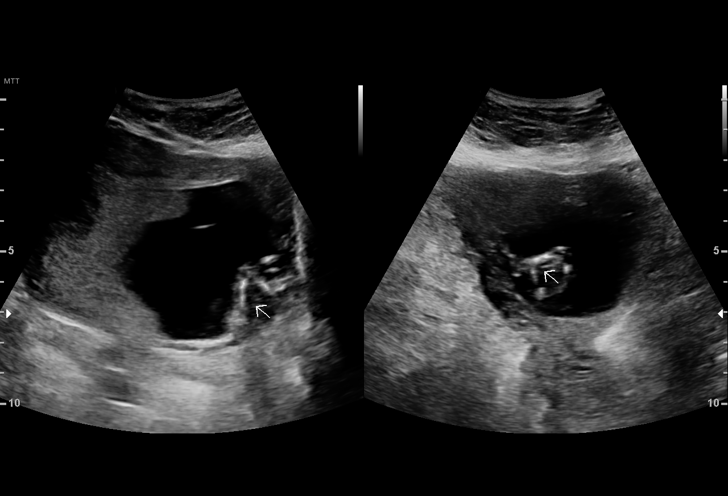

[14 of 28 positions shown; findings below may reference images not displayed]

TRANSLUCENCY

Indications

 13 weeks gestation of pregnancy
 Previous cesarean delivery, antepartum         [Y4]
 Diabetes - Pregestational, 1st trimester       [Y4]
 Hypothyroid                                    [Y4] [Y4]
 Poor obstetric history: Previous               [Y4]
 preeclampsia / eclampsia/gestational HTN
 Maternal morbid obesity                        [Y4] [Y4]
 Hypertension - Chronic/Pre-existing            [Y4]
 Advanced maternal age multigravida 35+,        [Y4]
 first trimester
 Medical complication of pregnancy              [Y4]
 (antiphospholipid syndrome)
Vital Signs

 BMI:
Fetal Evaluation

 Num Of Fetuses:         1
 Preg. Location:         Intrauterine
 Fetal Heart Rate(bpm):  153
 Cardiac Activity:       Observed
 Presentation:           Variable
 Placenta:               Posterior

 Amniotic Fluid
 AFI FV:      Within normal limits
OB History
 Blood Type:   A+
 Maternal Racial/Ethnic Group:   White
 Gravidity:    5         Term:   1        Prem:   0        SAB:   3
 TOP:          0       Ectopic:  0        Living: 1
Gestational Age

 LMP:           13w 0d        Date:  [DATE]                 EDD:   [DATE]
 Best:          13w 0d     Det. By:  LMP  ([DATE])          EDD:   [DATE]
1st Trimester Genetic Sonogram Screening

 CRL:            72.7  mm    G. Age:   13w 1d                 EDD:   [DATE]
 Nuc Trans:       1.5  mm
 Nasal Bone:                 Present
Anatomy

 Ventricles:            Visualized             Bladder:                Visualized
 Heart:                 Visualized             Upper Extremities:      Visualized
 Stomach:               Visualized             Lower Extremities:      Visualized

 Other:  Technically difficult due to maternal habitus and early GA.
Cervix Uterus Adnexa

 Cervix
 Normal appearance by transabdominal scan.

 Uterus
 No abnormality visualized.

 Right Ovary
 Within normal limits. No adnexal mass visualized.

 Left Ovary
 Within normal limits. Small corpus luteum noted.

 Cul De Sac
 No free fluid seen.

 Adnexa
 No abnormality visualized.
Impression

 Advanced maternal age. Patient has multiple medical
 problems. Patient is here for nuchal-translucency screening.
 Chronic hypertension: She takes amlodipine 10 mg daily and
 low-dose aspirin prophylaxis. BP today at our office is 127/77
 mm Hg.
 Pregestational diabetes: Recent Hb A1C is 7.1%. Patient
 takes insulin and metformin for control.
 She also has a diagnosis of antiphospholipid syndrome
 (miscarriages). She takes lovenox 40 mg daily.
 Hypothyroidism: On Synthroid supplements (?)
 History of cesarean delivery.
 On ultrasound, the CRL measurement is consistent with her
 previously-established dates and good fetal heart activity is
 seen. The nuchal translucency (NT) measures
 millimeters, which is normal.  Fetal anatomy that could be
 ascertained at this gestational age is normal.
 Patient met with our genetic counselor after ultrasound. See
 separate letter. After counseling, she opted not to screen or
 have invasive testing for fetal aneuploidies.
Recommendations

 -An appointment was made for her to return in 6 weeks for
 fetal anatomy scan.
                 BAJKO

## 2020-02-26 NOTE — Progress Notes (Signed)
02/26/2020  Kelli Miller 14-Feb-1981 MRN: [id number] DOV: 02/26/2020  Kelli Miller presented to the [facility] for a genetics consultation regarding advanced maternal age. Kelli Miller came to her appointment alone due to COVID-19 visitor restrictions.   Indication for genetic counseling - Advanced maternal age  Prenatal history  Kelli Miller is a [id number], 39 y.o. female. Her current pregnancy has completed [redacted]w[redacted]d (Estimated Date of Delivery: 09/02/20).  Kelli Miller denied exposure to environmental toxins or chemical agents. She denied the use of alcohol, tobacco or street drugs. She reported taking Norvasc, Lovenox, insulin, aspirin, Metformin, Synthroid, prenatal vitamins, progesterone, and Stelara. She denied significant viral illnesses, fevers, and bleeding during the course of her pregnancy. Kelli Miller has a history of diabetes, hypothyroidism, hypertension, and antiphospholipid syndrome. She has had three miscarriages and has a 28 month old daughter who was born via Cesarean section. Her medical and surgical histories were otherwise noncontributory.  Family History  A three generation pedigree was drafted and reviewed. The family history is remarkable for the following:  - Kelli Miller partner, Kelli Miller, had a learning disability in school. We discussed that many times, learning difficulties are multifactorial in nature, occurring due to a combination of genetic and environmental factors that are difficult to identify. Learning disabilities can appear to run in families; thus, there is a chance that the couple's children could also experience learning difficulties of some kind. Kelli Miller understands that she should make the pediatrician aware of any concerns she has about her children's development.  - Kelli Miller has a maternal half sister whose daughter has seizures. Seizures can occur secondary to a variety of environmental, lifestyle, and genetic factors. When epilepsy does  not have an identified genetic cause, the chance that a first degree relative of someone with epilepsy will also have or develop epilepsy is approximately 2-5%. Given that Kelli Miller niece is a fourth degree relative to the current fetus, recurrence risk for epilepsy is likely not greatly elevated over the general population risk of 1%. However, without knowing the etiology of the seizures in the family, precise risk assessment is limited.  The remaining family histories were reviewed and found to be noncontributory for birth defects, intellectual disability, recurrent pregnancy loss, and known genetic conditions.    The patient's ethnicity is Caucasian. The father of the pregnancy's ethnicity is African American. Ashkenazi Jewish ancestry and consanguinity were denied. Pedigree will be scanned under Media.  Discussion  Kelli Miller was referred to genetic counseling for advanced maternal age, as she will be 39 years old at the time of delivery. We discussed that as a woman ages, the risk for certain chromosomal conditions, such as trisomy 22 (Down syndrome), trisomy 45, and trisomy 18 increases. These conditions often are not inherited, but instead occur due to an error in chromosomal division during the formation of sperm and egg cells in a process called nondisjunction. At her age and during the first trimester, Kelli Miller has approximately a 1 in 22 (4.5%) chance of having a child with a chromosomal abnormality. Her age-related chance of having a child with Down syndrome specifically is 1 in 43 (2.3%) in the first trimester. We briefly reviewed features associated with Down syndrome, trisomy 88, and trisomy 22.   We reviewed noninvasive prenatal screening (NIPS) as an available screening option for chromosomal aneuploidies. Specifically, we discussed that NIPS analyzes cell free DNA originating from the placenta that is found in the maternal blood circulation during pregnancy.  This test is not diagnostic  for chromosome conditions, but can provide information regarding the presence or absence of extra fetal DNA for chromosomes 13, 18, 21, and the sex chromosomes. Thus, it would not identify or rule out all fetal aneuploidy. The reported detection rate is 91-99% for trisomies 21, 18, 13, and sex chromosome aneuploidies. The false positive rate is reported to be less than 0.1% for any of these conditions.   We also reviewed that NIPS has several limitations. One major limitation is the possibility of low fetal fraction. The term "fetal fraction" refers to the amount of sample that is believed to have come from fetal DNA rather than maternal DNA. We reviewed that there are many possible reasons a sample may have a low fetal fraction, including early gestational age, high maternal BMI, suboptimal sample collection, maternal use of medications like low molecular weight heparin, pregnancy loss, pregnancy complications, and normal variation. Given that Kelli Miller is still early in her pregnancy and is taking Lovenox, there is a possibility that her sample may contain low fetal fraction. If this is the case, a repeat sample may be requested.  We also discussed the option of first trimester screening. Instead of utilizing fetal DNA, first trimester screening measures levels of three proteins in a pregnant individual's blood. The levels of these proteins can help identify if a pregnancy is at high risk for trisomy 60 (Down syndrome) or trisomy 42. First trimester screening also takes into account a patient's age and a nuchal translucency measurement from ultrasound when performing a risk calculation. First trimester screening is not impacted by maternal use of Lovenox. However, it is less sensitive than NIPS, detecting 85% of cases of Down syndrome and 75% of cases of trisomy 78. The false positive rate is up to 5%. First trimester screening also cannot assess for the risk of trisomy 70 or sex chromosome aneuploidies.  Additionally, since age is a factor in determining a fetus's risk for chromosomal aneuploidies as a part of first trimester screening, results may be flagged as high-risk due to the patient's age. The risk cut-offs used for first trimester screening are 1 in 250 for Down syndrome and 1 in 100 for trisomy 18. Any screening result higher than these cutoffs will be flagged as high-risk, even if they represent a decrease from a patient's a priori age-related risk. This can be anxiety-provoking for some individuals.  After considerations and the benefits and limitations of aneuploidy screening, Kelli Miller declined both NIPS and FTS. She understands that NIPS remains an option throughout the end of her pregnancy should she change her mind about undergoing aneuploidy screening at any time.  Per ACOG recommendation, carrier screening for hemoglobinopathies, cystic fibrosis (CF), spinal muscular atrophy (SMA), and hemoglobinopathies was discussed, including information about the conditions, rationale for testing, autosomal recessive inheritance, and the option of prenatal diagnosis. I offered carrier screening for these conditions, which Kelli Miller declined at this time. Without carrier screening to refine risk and based on ethnicity alone, Kelli Miller's chance of being a carrier of CF is 1 in 46. Her chance of being a carrier of SMA is 1 in 16. Her chance of being a carrier for HBB-related hemoglobinopathies is 1 in 34. She was informed that select hemoglobinopathies, CF, and SMA are included on [place]'s newborn screening panel.   A limited first trimester ultrasound was performed today prior to our visit. The ultrasound report will be sent under separate cover. There were no visualized fetal anomalies or  markers suggestive of aneuploidy at this gestational age. Nuchal translucency measurement was within the normal range.  Kelli Miller was also counseled regarding the option of diagnostic testing via chorionic  villus sampling ([facility]) or amniocentesis. We discussed the technical aspects of each procedure and quoted up to a 1 in 500 (0.2%) risk for spontaneous pregnancy loss or other adverse pregnancy outcomes as a result of either procedure. Cultured cells from either a placental or amniotic fluid sample allow for the visualization of a fetal karyotype, which can detect >99% of chromosomal aberrations. Chromosomal microarray can also be performed to identify smaller deletions or duplications of fetal chromosomal material. After careful consideration, Ms. Havel declined diagnostic testing at this time. She understands that diagnostic testing is available at any point through the end of pregnancy and that she may opt to undergo the procedure at a later date should she change her mind.  Additional screening and diagnostic testing were declined today. She understands that screening tests, including ultrasound, cannot rule out all birth defects or genetic syndromes. The patient was advised of this limitation and states she still does not want additional testing or screening at this time.   I counseled Ms. Procter regarding the above risks and available options. The approximate face-to-face time with the genetic counselor was 25 minutes.  In summary:  Discussed advanced maternal age and options for follow-up testing  ~4.5% chance for chromosomal aneuploidy based on age at delivery  Declined noninvasive prenatal screening (NIPS) and first trimester screening for chromosomal aneuploidies  Discussed carrier screening for cystic fibrosis, spinal muscular atrophy, and hemoglobinopathies  Declined carrier screening   Reviewed results of ultrasound  Normal nuchal translucency  No fetal anomalies or markers seen at current gestational age  Offered additional testing and screening  Declined chorionic villus sampling  Reviewed family history concerns   Gershon Crane, MS, Aeronautical engineer

## 2020-02-29 ENCOUNTER — Encounter (HOSPITAL_COMMUNITY): Payer: PRIVATE HEALTH INSURANCE

## 2020-02-29 ENCOUNTER — Other Ambulatory Visit (HOSPITAL_COMMUNITY): Payer: PRIVATE HEALTH INSURANCE

## 2020-03-07 ENCOUNTER — Encounter: Payer: Self-pay | Admitting: Obstetrics and Gynecology

## 2020-03-07 ENCOUNTER — Ambulatory Visit (INDEPENDENT_AMBULATORY_CARE_PROVIDER_SITE_OTHER): Payer: PRIVATE HEALTH INSURANCE | Admitting: Obstetrics and Gynecology

## 2020-03-07 ENCOUNTER — Other Ambulatory Visit: Payer: Self-pay

## 2020-03-07 VITALS — BP 132/75 | HR 100 | Wt 234.0 lb

## 2020-03-07 DIAGNOSIS — Z3009 Encounter for other general counseling and advice on contraception: Secondary | ICD-10-CM

## 2020-03-07 DIAGNOSIS — O99282 Endocrine, nutritional and metabolic diseases complicating pregnancy, second trimester: Secondary | ICD-10-CM | POA: Diagnosis not present

## 2020-03-07 DIAGNOSIS — O09299 Supervision of pregnancy with other poor reproductive or obstetric history, unspecified trimester: Secondary | ICD-10-CM

## 2020-03-07 DIAGNOSIS — Z862 Personal history of diseases of the blood and blood-forming organs and certain disorders involving the immune mechanism: Secondary | ICD-10-CM

## 2020-03-07 DIAGNOSIS — Z98891 History of uterine scar from previous surgery: Secondary | ICD-10-CM

## 2020-03-07 DIAGNOSIS — E039 Hypothyroidism, unspecified: Secondary | ICD-10-CM

## 2020-03-07 DIAGNOSIS — O24319 Unspecified pre-existing diabetes mellitus in pregnancy, unspecified trimester: Secondary | ICD-10-CM

## 2020-03-07 DIAGNOSIS — Z3A14 14 weeks gestation of pregnancy: Secondary | ICD-10-CM

## 2020-03-07 DIAGNOSIS — O10912 Unspecified pre-existing hypertension complicating pregnancy, second trimester: Secondary | ICD-10-CM

## 2020-03-07 DIAGNOSIS — O24312 Unspecified pre-existing diabetes mellitus in pregnancy, second trimester: Secondary | ICD-10-CM | POA: Diagnosis not present

## 2020-03-07 DIAGNOSIS — O0990 Supervision of high risk pregnancy, unspecified, unspecified trimester: Secondary | ICD-10-CM

## 2020-03-07 DIAGNOSIS — O09292 Supervision of pregnancy with other poor reproductive or obstetric history, second trimester: Secondary | ICD-10-CM

## 2020-03-07 DIAGNOSIS — O10919 Unspecified pre-existing hypertension complicating pregnancy, unspecified trimester: Secondary | ICD-10-CM

## 2020-03-07 DIAGNOSIS — O0992 Supervision of high risk pregnancy, unspecified, second trimester: Secondary | ICD-10-CM

## 2020-03-07 NOTE — Progress Notes (Signed)
   PRENATAL VISIT NOTE  Subjective:  Kelli Miller is a 39 y.o. [phone] at [redacted]w[redacted]d being seen today for ongoing prenatal care.  She is currently monitored for the following issues for this high-risk pregnancy and has Advanced maternal age in multigravida; Preexisting hypertension complicating pregnancy, antepartum; PCOS (polycystic ovarian syndrome); GERD (gastroesophageal reflux disease); Psoriasis; Hypothyroidism; Diabetes mellitus (HCC); B12 deficiency; Vitamin D deficiency; Family history of premature CAD; History of antiphospholipid syndrome; Supervision of high risk pregnancy, antepartum; Preexisting diabetes complicating pregnancy, antepartum; Hypothyroid in pregnancy, antepartum; History of severe preeclampsia superimposed on chronic hypertension in prior pregnancy; History of cesarean section for failure to progress; Maternal morbid obesity, antepartum (HCC); Left ankle pain; and Unwanted fertility on their problem list.  Patient reports no complaints.  Contractions: Not present. Vag. Bleeding: None.  Movement: Absent. Denies leaking of fluid.   The following portions of the patient's history were reviewed and updated as appropriate: allergies, current medications, past family history, past medical history, past social history, past surgical history and problem list.   Objective:   Vitals:   03/07/20 1550  BP: 132/75  Pulse: 100  Weight: 234 lb (106.1 kg)    Fetal Status: Fetal Heart Rate (bpm): 149   Movement: Absent     General:  Alert, oriented and cooperative. Patient is in no acute distress.  Skin: Skin is warm and dry. No rash noted.   Cardiovascular: Normal heart rate noted  Respiratory: Normal respiratory effort, no problems with respiration noted  Abdomen: Soft, gravid, appropriate for gestational age.  Pain/Pressure: Absent     Pelvic: Cervical exam deferred        Extremities: Normal range of motion.  Edema: None  Mental Status: Normal mood and affect. Normal behavior.  Normal judgment and thought content.   Assessment and Plan:  Pregnancy: [id number] at [redacted]w[redacted]d 1. Preexisting hypertension complicating pregnancy, antepartum Cont norvasc 10 mg daily Cont baby ASA  2. Preexisting diabetes complicating pregnancy, antepartum Taking 6 lispro am, 9 units pm NPH 38 am and 32 pm Was increased to this regimen today by endocrinology, reviewed CBGs, fasting remain 100s  3. Hypothyroid in pregnancy, antepartum Repeat TSH today  4. Supervision of high risk pregnancy, antepartum  5. History of cesarean section for failure to progress For RCS  6. History of severe preeclampsia superimposed on chronic hypertension in prior pregnancy Delivered at 37 weeks due to this  7. History of antiphospholipid syndrome Cont lovenox  8. Unwanted fertility Desires BTL   Preterm labor symptoms and general obstetric precautions including but not limited to vaginal bleeding, contractions, leaking of fluid and fetal movement were reviewed in detail with the patient. Please refer to After Visit Summary for other counseling recommendations.   Return in about 2 weeks (around 03/21/2020) for virtual, high OB.  Future Appointments  Date Time Provider Department Center  03/21/2020  1:30 PM Tereso Newcomer, MD [facility]  04/08/2020 10:30 AM [facility] NURSE [facility]  04/08/2020 10:30 AM [facility] US3 [facility]    Conan Bowens, MD

## 2020-03-08 LAB — TSH: TSH: 1.66 mIU/L

## 2020-03-21 ENCOUNTER — Telehealth (INDEPENDENT_AMBULATORY_CARE_PROVIDER_SITE_OTHER): Payer: PRIVATE HEALTH INSURANCE | Admitting: Obstetrics & Gynecology

## 2020-03-21 ENCOUNTER — Encounter: Payer: Self-pay | Admitting: *Deleted

## 2020-03-21 ENCOUNTER — Other Ambulatory Visit: Payer: Self-pay

## 2020-03-21 VITALS — BP 123/89 | Wt 228.0 lb

## 2020-03-21 DIAGNOSIS — O24319 Unspecified pre-existing diabetes mellitus in pregnancy, unspecified trimester: Secondary | ICD-10-CM

## 2020-03-21 DIAGNOSIS — O0992 Supervision of high risk pregnancy, unspecified, second trimester: Secondary | ICD-10-CM

## 2020-03-21 DIAGNOSIS — O10912 Unspecified pre-existing hypertension complicating pregnancy, second trimester: Secondary | ICD-10-CM

## 2020-03-21 DIAGNOSIS — Z3A16 16 weeks gestation of pregnancy: Secondary | ICD-10-CM

## 2020-03-21 DIAGNOSIS — O0990 Supervision of high risk pregnancy, unspecified, unspecified trimester: Secondary | ICD-10-CM

## 2020-03-21 DIAGNOSIS — O24112 Pre-existing diabetes mellitus, type 2, in pregnancy, second trimester: Secondary | ICD-10-CM

## 2020-03-21 DIAGNOSIS — O09522 Supervision of elderly multigravida, second trimester: Secondary | ICD-10-CM

## 2020-03-21 DIAGNOSIS — O09529 Supervision of elderly multigravida, unspecified trimester: Secondary | ICD-10-CM

## 2020-03-21 DIAGNOSIS — O10919 Unspecified pre-existing hypertension complicating pregnancy, unspecified trimester: Secondary | ICD-10-CM

## 2020-03-21 MED ORDER — INSULIN LISPRO 100 UNIT/ML ~~LOC~~ SOLN: SUBCUTANEOUS | 11 refills | Status: DC

## 2020-03-21 MED ORDER — INSULIN NPH (HUMAN) (ISOPHANE) 100 UNIT/ML ~~LOC~~ SUSP: SUBCUTANEOUS | 3 refills | Status: DC

## 2020-03-21 NOTE — Addendum Note (Signed)
Addended by: Granville Lewis on: 03/21/2020 04:11 PM   Modules accepted: Orders

## 2020-03-21 NOTE — Patient Instructions (Signed)

## 2020-03-21 NOTE — Progress Notes (Signed)
OBSTETRICS PRENATAL VIRTUAL VISIT ENCOUNTER NOTE  Provider location: [facility] [place]   I connected with Kelli Miller on 03/21/20 at  1:30 PM EDT by MyChart Video Encounter at home and verified that I am speaking with the correct person using two identifiers.   I discussed the limitations, risks, security and privacy concerns of performing an evaluation and management service virtually and the availability of in person appointments. I also discussed with the patient that there may be a patient responsible charge related to this service. The patient expressed understanding and agreed to proceed. Subjective:  Kelli Miller is a 39 y.o. [phone] at 76w3dbeing seen today for ongoing prenatal care.  She is currently monitored for the following issues for this high-risk pregnancy and has Advanced maternal age in multigravida; Preexisting hypertension complicating pregnancy, antepartum; PCOS (polycystic ovarian syndrome); GERD (gastroesophageal reflux disease); Psoriasis; Hypothyroidism; Diabetes mellitus ([facility]; B12 deficiency; Vitamin D deficiency; Family history of premature CAD; History of antiphospholipid syndrome; Supervision of high risk pregnancy, antepartum; Preexisting diabetes complicating pregnancy, antepartum; Hypothyroid in pregnancy, antepartum; History of severe preeclampsia superimposed on chronic hypertension in prior pregnancy; History of cesarean section for failure to progress; Maternal morbid obesity, antepartum ([place]; Left ankle pain; and Unwanted fertility on their problem list.  Patient reports no complaints.  Contractions: Not present. Vag. Bleeding: None.  Movement: Absent. Denies any leaking of fluid.   The following portions of the patient's history were reviewed and updated as appropriate: allergies, current medications, past family history, past medical history, past social history, past surgical history and problem list.   Objective:    Vitals:   03/21/20 1508  BP: 123/89  Weight: 228 lb (103.4 kg)    Fetal Status:     Movement: Absent     General:  Alert, oriented and cooperative. Patient is in no acute distress.  Respiratory: Normal respiratory effort, no problems with respiration noted  Mental Status: Normal mood and affect. Normal behavior. Normal judgment and thought content.  Rest of physical exam deferred due to type of encounter  Imaging: [place]FM Fetal Nuchal Translucency  Result Date: 02/26/2020 ----------------------------------------------------------------------  OBSTETRICS REPORT                       (Signed Final 02/26/2020 12:28 pm) ---------------------------------------------------------------------- Patient Info  ID #:       [id number]                         D.O.B.:  011-25-1982(38 yrs)  Name:       Kelli Miller                 Visit Date: 02/26/2020 10:25 am ---------------------------------------------------------------------- Performed By  Attending:        RTama HighMD        Ref. Address:     [address] [place] Performed By:     MJacob MooresBS,       [facility]  Referred By:      [facility] ---------------------------------------------------------------------- Orders  #  Description                           Code        Ordered By  1  [place] MFM FETAL NUCHAL                   [phone]     Aaryan Essman     TRANSLUCENCY ----------------------------------------------------------------------  #  Order #                     Accession #                Episode #  1  [id number]                   [id number]                 [id number] ---------------------------------------------------------------------- Indications  [redacted] weeks gestation of pregnancy                Z3A.13  Previous cesarean delivery, antepartum         O34.219  Diabetes -  Pregestational, 1st trimester       O24.911  Hypothyroid                                    O99.280 E03.9  Poor obstetric history: Previous               O09.299  preeclampsia / eclampsia/gestational HTN  Maternal morbid obesity                        O99.210 E66.01  Hypertension - Chronic/Pre-existing            O10.019  Advanced maternal age multigravida 73+,        O68.521  first trimester  Medical complication of pregnancy              O26.90  (antiphospholipid syndrome) ---------------------------------------------------------------------- Vital Signs  Weight (lb): 227                               Height:        5'1"  BMI:         42.89 ---------------------------------------------------------------------- Fetal Evaluation  Num Of Fetuses:         1  Preg. Location:         Intrauterine  Fetal Heart Rate(bpm):  153  Cardiac Activity:       Observed  Presentation:           Variable  Placenta:               Posterior  Amniotic Fluid  AFI FV:      Within normal limits ---------------------------------------------------------------------- OB History  Blood Type:   A+  Maternal Racial/Ethnic Group:   White  Gravidity:    5         Term:   1        Prem:   0  SAB:   3  TOP:          0       Ectopic:  0        Living: 1 ---------------------------------------------------------------------- Gestational Age  LMP:           13w 0d        Date:  11/27/19                 EDD:   09/02/20  Best:          [place] 0d     Det. By:  LMP  (11/27/19)          EDD:   09/02/20 ---------------------------------------------------------------------- 1st Trimester Genetic Sonogram Screening  CRL:            72.7  mm    G. Age:   13w 1d                 EDD:   09/01/20  Nuc Trans:       1.5  mm  Nasal Bone:                 Present ---------------------------------------------------------------------- Anatomy  Ventricles:            Visualized             Bladder:                Visualized  Heart:                 Visualized              Upper Extremities:      Visualized  Stomach:               Visualized             Lower Extremities:      Visualized  Other:  Technically difficult due to maternal habitus and early GA. ---------------------------------------------------------------------- Cervix Uterus Adnexa  Cervix  Normal appearance by transabdominal scan.  Uterus  No abnormality visualized.  Right Ovary  Within normal limits. No adnexal mass visualized.  Left Ovary  Within normal limits. Small corpus luteum noted.  Cul De Sac  No free fluid seen.  Adnexa  No abnormality visualized. ---------------------------------------------------------------------- Impression  Advanced maternal age. Patient has multiple medical  problems. Patient is here for nuchal-translucency screening.  Chronic hypertension: She takes amlodipine 10 mg daily and  low-dose aspirin prophylaxis. BP today at our office is 127/77  mm Hg.  Pregestational diabetes: Recent Hb A1C is 7.1%. Patient  takes insulin and metformin for control.  She also has a diagnosis of antiphospholipid syndrome  (miscarriages). She takes lovenox 40 mg daily.  Hypothyroidism: On Synthroid supplements (?)  History of cesarean delivery.  On ultrasound, the CRL measurement is consistent with her  previously-established dates and good fetal heart activity is  seen. The nuchal translucency (NT) measures 1.5  millimeters, which is normal.  Fetal anatomy that could be  ascertained at this gestational age is normal.  Patient met with our genetic counselor after ultrasound. See  separate letter. After counseling, she opted not to screen or  have invasive testing for fetal aneuploidies. ---------------------------------------------------------------------- Recommendations  -An appointment was made for her to return in 6 weeks for  fetal anatomy scan. ----------------------------------------------------------------------                  Tama High, MD Electronically Signed Final Report   02/26/2020 12:28 pm  ----------------------------------------------------------------------  Assessment and Plan:  Pregnancy: [id number] at 41w3d1. Preexisting diabetes complicating pregnancy, antepartum Blood sugars reviewed. Elevated fastings in 110s, and elevated PP breakfast.  High PP lunch and dinner in 130s. Insulin regimen adjusted; will now take Lispro 9/9, NPH 40/36. Will reevaluate.  Already scheduled for anatomy scan, needs fetal ECHO scheduled.   2. Preexisting hypertension complicating pregnancy, antepartum Stable BP on Amlodipine  3. Antepartum multigravida of advanced maternal age Anatomy scan scheduled.   4. [redacted] weeks gestation of pregnancy 5. Supervision of high risk pregnancy, antepartum No other complaints or concerns.  Routine obstetric precautions reviewed.  I discussed the assessment and treatment plan with the patient. The patient was provided an opportunity to ask questions and all were answered. The patient agreed with the plan and demonstrated an understanding of the instructions. The patient was advised to call back or seek an in-person office evaluation/[facility]. Please refer to After Visit Summary for other counseling recommendations.   I provided 10 minutes of non face-to-face time during this encounter (video services were not available due to Epic downtime)  Return in about 2 weeks (around 04/04/2020) for OFFICE OB Visit and blood sugar review.  Future Appointments  Date Time Provider [facility] 04/08/2020 10:30 AM [facility] 04/08/2020 10:30 AM [facility] 04/08/2020  1:00 PM DSloan Leiter MD [facility]    UVerita Schneiders MD [facility]

## 2020-03-22 ENCOUNTER — Telehealth: Payer: Self-pay | Admitting: *Deleted

## 2020-03-22 MED ORDER — INSULIN ASPART 100 UNIT/ML ~~LOC~~ SOLN
SUBCUTANEOUS | 12 refills | Status: DC
Start: 2020-03-22 — End: 2020-08-14

## 2020-03-22 NOTE — Telephone Encounter (Signed)
Pharmacy called stating that pt's Lispro needed to be changed to Novolog due to insurance coverae.  New RX sent to pharmacy for Novalog 9units AM and 9 units PM. Per Dr Macon Large

## 2020-03-26 NOTE — Telephone Encounter (Signed)
Erroneous

## 2020-04-08 ENCOUNTER — Ambulatory Visit: Payer: PRIVATE HEALTH INSURANCE | Attending: Obstetrics & Gynecology

## 2020-04-08 ENCOUNTER — Encounter: Payer: Self-pay | Admitting: Obstetrics and Gynecology

## 2020-04-08 ENCOUNTER — Other Ambulatory Visit: Payer: Self-pay

## 2020-04-08 ENCOUNTER — Ambulatory Visit (INDEPENDENT_AMBULATORY_CARE_PROVIDER_SITE_OTHER): Payer: PRIVATE HEALTH INSURANCE | Admitting: Obstetrics and Gynecology

## 2020-04-08 ENCOUNTER — Ambulatory Visit: Payer: PRIVATE HEALTH INSURANCE | Admitting: *Deleted

## 2020-04-08 ENCOUNTER — Other Ambulatory Visit: Payer: Self-pay | Admitting: *Deleted

## 2020-04-08 VITALS — BP 133/78 | HR 112 | Wt 238.0 lb

## 2020-04-08 DIAGNOSIS — Z3A19 19 weeks gestation of pregnancy: Secondary | ICD-10-CM | POA: Diagnosis present

## 2020-04-08 DIAGNOSIS — O10012 Pre-existing essential hypertension complicating pregnancy, second trimester: Secondary | ICD-10-CM | POA: Diagnosis not present

## 2020-04-08 DIAGNOSIS — E039 Hypothyroidism, unspecified: Secondary | ICD-10-CM

## 2020-04-08 DIAGNOSIS — O99280 Endocrine, nutritional and metabolic diseases complicating pregnancy, unspecified trimester: Secondary | ICD-10-CM | POA: Insufficient documentation

## 2020-04-08 DIAGNOSIS — Z8759 Personal history of other complications of pregnancy, childbirth and the puerperium: Secondary | ICD-10-CM

## 2020-04-08 DIAGNOSIS — Z3009 Encounter for other general counseling and advice on contraception: Secondary | ICD-10-CM

## 2020-04-08 DIAGNOSIS — O09299 Supervision of pregnancy with other poor reproductive or obstetric history, unspecified trimester: Secondary | ICD-10-CM

## 2020-04-08 DIAGNOSIS — O24319 Unspecified pre-existing diabetes mellitus in pregnancy, unspecified trimester: Secondary | ICD-10-CM

## 2020-04-08 DIAGNOSIS — O24312 Unspecified pre-existing diabetes mellitus in pregnancy, second trimester: Secondary | ICD-10-CM

## 2020-04-08 DIAGNOSIS — O09521 Supervision of elderly multigravida, first trimester: Secondary | ICD-10-CM | POA: Insufficient documentation

## 2020-04-08 DIAGNOSIS — O09522 Supervision of elderly multigravida, second trimester: Secondary | ICD-10-CM | POA: Diagnosis not present

## 2020-04-08 DIAGNOSIS — D6861 Antiphospholipid syndrome: Secondary | ICD-10-CM

## 2020-04-08 DIAGNOSIS — O0992 Supervision of high risk pregnancy, unspecified, second trimester: Secondary | ICD-10-CM

## 2020-04-08 DIAGNOSIS — Z862 Personal history of diseases of the blood and blood-forming organs and certain disorders involving the immune mechanism: Secondary | ICD-10-CM | POA: Diagnosis present

## 2020-04-08 DIAGNOSIS — O0990 Supervision of high risk pregnancy, unspecified, unspecified trimester: Secondary | ICD-10-CM

## 2020-04-08 DIAGNOSIS — O10919 Unspecified pre-existing hypertension complicating pregnancy, unspecified trimester: Secondary | ICD-10-CM

## 2020-04-08 DIAGNOSIS — O99282 Endocrine, nutritional and metabolic diseases complicating pregnancy, second trimester: Secondary | ICD-10-CM

## 2020-04-08 DIAGNOSIS — O10912 Unspecified pre-existing hypertension complicating pregnancy, second trimester: Secondary | ICD-10-CM

## 2020-04-08 DIAGNOSIS — O34219 Maternal care for unspecified type scar from previous cesarean delivery: Secondary | ICD-10-CM

## 2020-04-08 DIAGNOSIS — Z98891 History of uterine scar from previous surgery: Secondary | ICD-10-CM

## 2020-04-08 DIAGNOSIS — O99112 Other diseases of the blood and blood-forming organs and certain disorders involving the immune mechanism complicating pregnancy, second trimester: Secondary | ICD-10-CM

## 2020-04-08 IMAGING — US US MFM OB DETAIL+14 WK
1 series · 15 of 28 positions shown · non-contrast
Comparison: none

[Series 1: us mfm ob detail+14 wk · 15 of 54 slices shown]
[im 1/54]
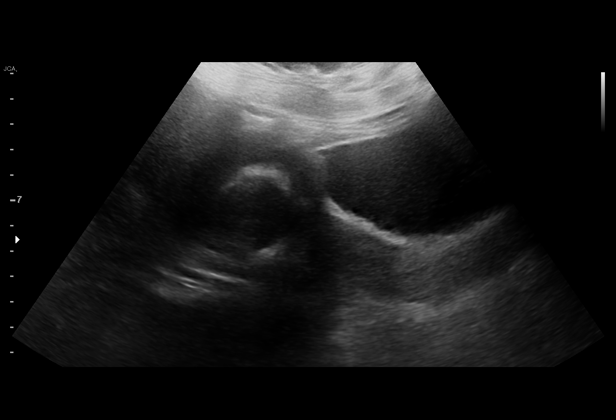
[im 4/54]
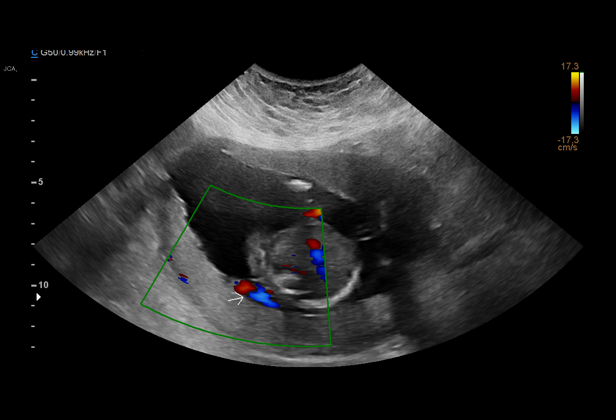
[im 8/54]
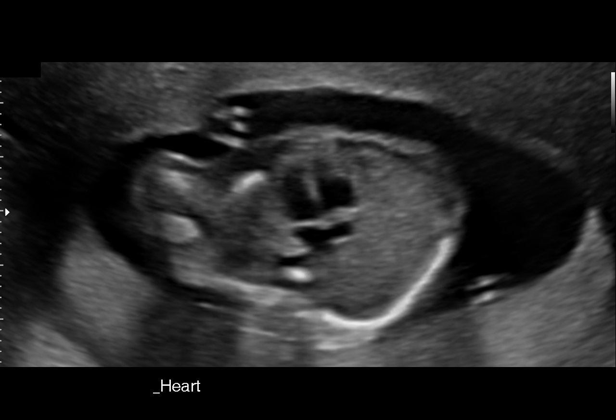
[im 12/54]
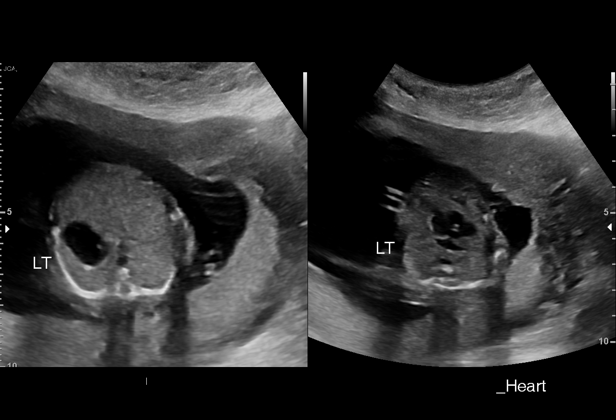
[im 16/54]
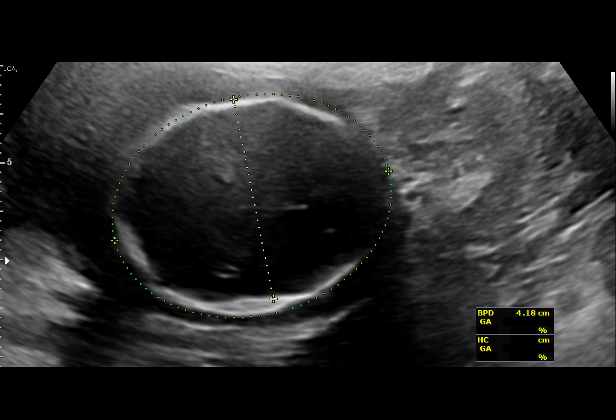
[im 20/54]
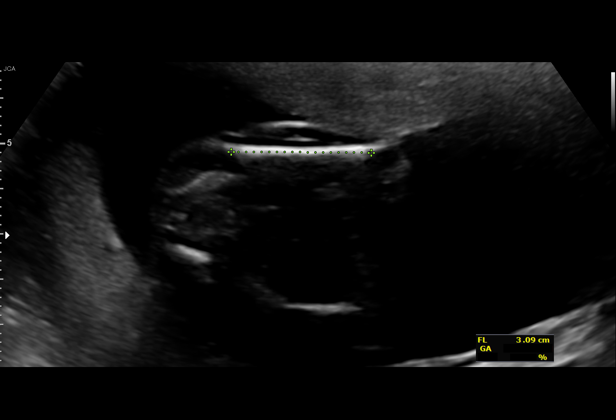
[im 24/54]
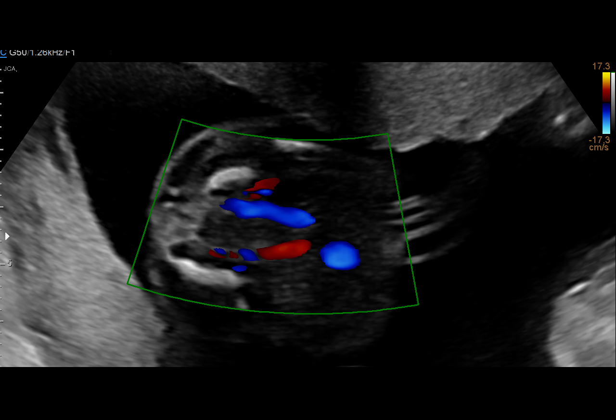
[im 28/54]
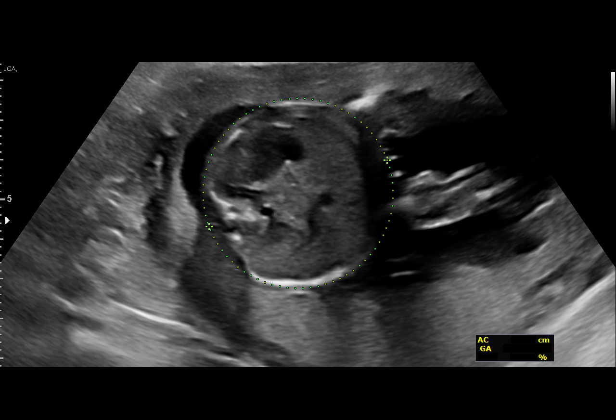
[im 30/54]
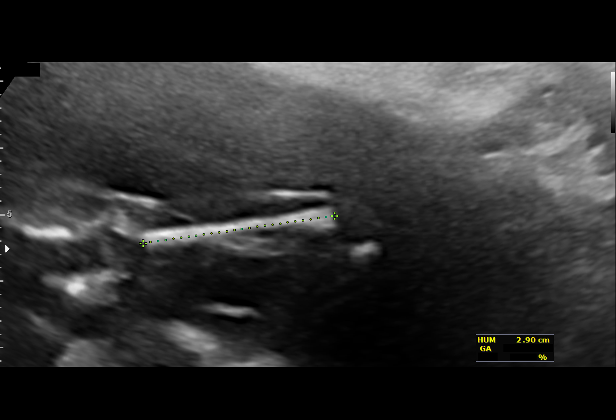
[im 34/54]
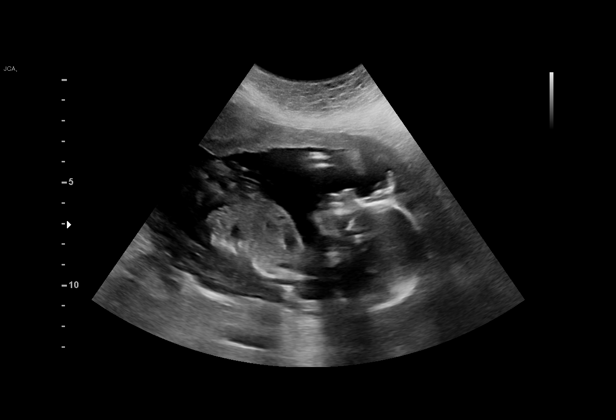
[im 38/54]
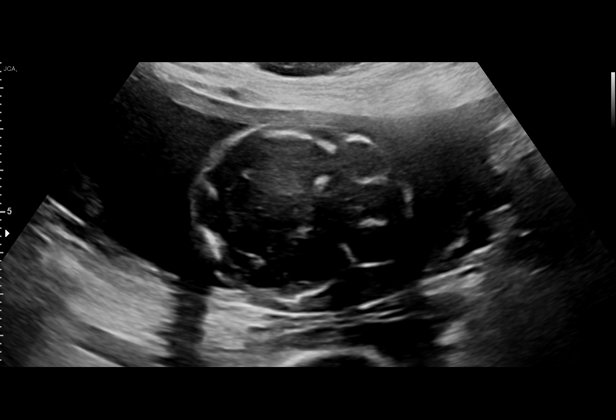
[im 42/54]
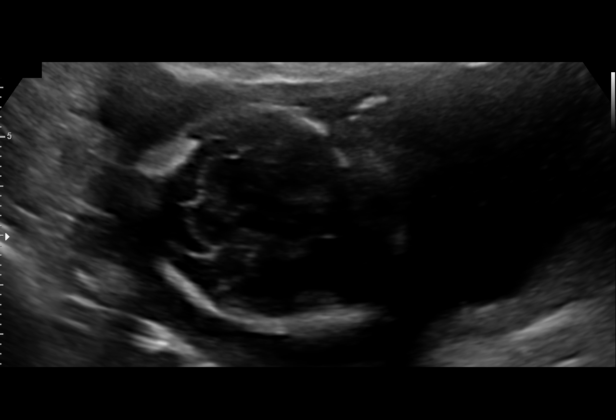
[im 46/54]
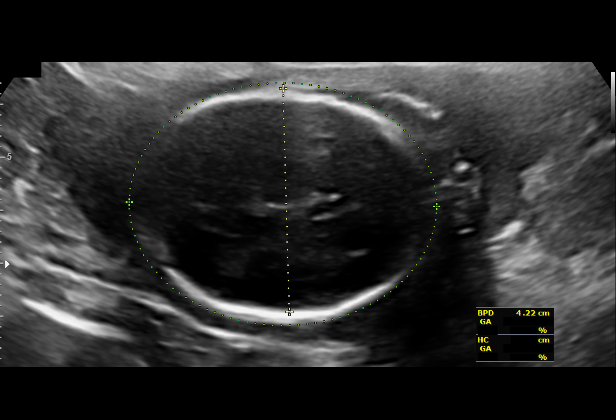
[im 50/54]
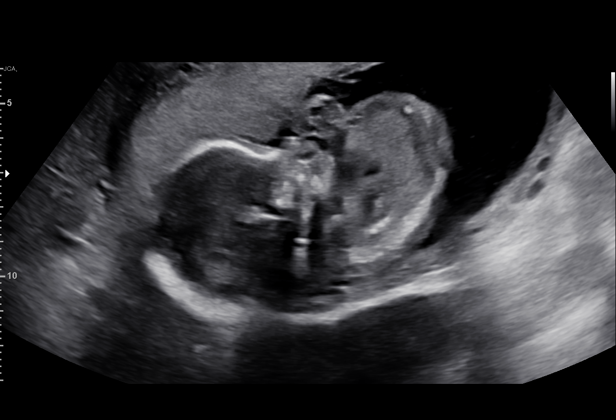
[im 54/54]
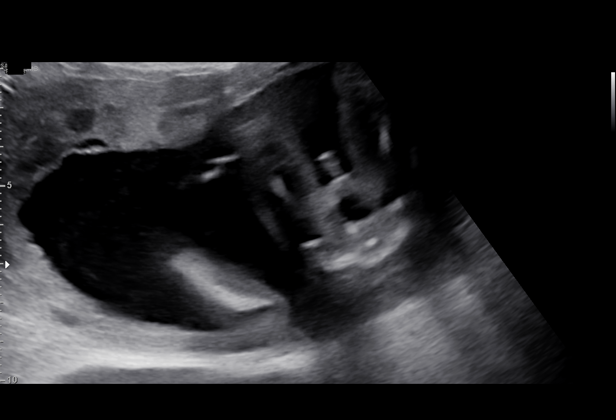

[15 of 28 positions shown; findings below may reference images not displayed]

Indications

 Advanced maternal age multigravida 35+,        [JJ]
 second trimester
 Hypertension - Chronic/Pre-existing            [JJ]
 Obesity complicating pregnancy, second         [JJ]
 trimester (BMI 43)
 Pre-existing diabetes, type 2, in pregnancy,   [JJ]
 second trimester
 Antiphospholipid syndrome complicating         O99.119, [JJ]
 pregnancy, antepartum
 Encounter for antenatal screening for          [JJ]
 malformations
 Hypothyroid                                    [JJ] [JJ]
 History of cesarean delivery, currently        [JJ]
 pregnant
 Poor obstetric history: Previous               [JJ]
 preeclampsia / eclampsia/gestational HTN
 19 weeks gestation of pregnancy
Vital Signs

                                                Height:        5'1"
Fetal Evaluation

 Num Of Fetuses:         1
 Fetal Heart Rate(bpm):  138
 Cardiac Activity:       Observed
 Presentation:           Cephalic
 Placenta:               Posterior
 P. Cord Insertion:      Visualized

 Amniotic Fluid
 AFI FV:      Within normal limits
Biometry

 BPD:      41.5  mm     G. Age:  18w 4d         32  %    CI:        68.48   %    70 - 86
                                                         FL/HC:      20.7   %    16.1 -
 HC:      160.3  mm     G. Age:  18w 6d         35  %    HC/AC:      1.06        1.09 -
 AC:      151.3  mm     G. Age:  20w 2d         86  %    FL/BPD:     80.0   %
 FL:       33.2  mm     G. Age:  20w 3d         87  %    FL/AC:      21.9   %    20 - 24
 HUM:      29.1  mm     G. Age:  19w 3d         62  %
 CER:      19.8  mm     G. Age:  19w 1d         43  %
 NFT:       3.9  mm

 LV:        5.9  mm
 CM:          5  mm

 Est. FW:     336  gm    0 lb 12 oz      97  %
OB History

 Blood Type:   A+
 Maternal Racial/Ethnic Group:   White
 Gravidity:    5         Term:   1        Prem:   0        SAB:   3
 TOP:          0       Ectopic:  0        Living: 1
Gestational Age

 LMP:           19w 0d        Date:  [DATE]                 EDD:   [DATE]
 U/S Today:     19w 4d                                        EDD:   [DATE]
 Best:          19w 0d     Det. By:  LMP  ([DATE])          EDD:   [DATE]
Anatomy

 Cranium:               Appears normal         Aortic Arch:            Not well visualized
 Cavum:                 Appears normal         Ductal Arch:            Not well visualized
 Ventricles:            Appears normal         Diaphragm:              Not well visualized
 Choroid Plexus:        Appears normal         Stomach:                Appears normal, left
                                                                       sided
 Cerebellum:            Appears normal         Abdomen:                Appears normal
 Posterior Fossa:       Not well visualized    Abdominal Wall:         Appears nml (cord
                                                                       insert, abd wall)
 Nuchal Fold:           Not well visualized    Cord Vessels:           Appears normal (3
                                                                       vessel cord)
 Face:                  Not well visualized    Kidneys:                Not well visualized
 Lips:                  Not well visualized    Bladder:                Appears normal
 Thoracic:              Appears normal         Spine:                  Not well visualized
 Heart:                 Appears normal         Upper Extremities:      Not well visualized
                        (4CH, axis, and
                        situs)
 RVOT:                  Appears normal         Lower Extremities:      Appears normal
 LVOT:                  Not well visualized

 Other:  Male gender. Technically difficult due to maternal habitus and fetal
         position.
Comments

 This patient was seen for a detailed fetal anatomy scan due
 to advanced maternal age, pregestational diabetes currently
 treated with Metformin and insulin, chronic hypertension
 currently treated with Norvasc, and the antiphospholipid
 antibody syndrome.  The patient reports that she was
 diagnosed with diabetes about 5 years ago.  Her hemoglobin
 A1c at the time of conception was 5.5%.  She was diagnosed
 with chronic hypertension 3 to 4 years ago.  She reports that
 she met the criteria for the diagnosis of the antiphospholipid
 antibody syndrome based on her three first trimester
 miscarriages and as she has screened positive for one of the
 antiphospholipid antibodies.  She denies any history of a prior
 thromboembolic event.  Due to the antiphospholipid antibody
 syndrome, she is currently treated with prophylactic Lovenox
 40 mg daily.  Her pregnancy has also been complicated by
 hypothyroidism and maternal obesity.
 She has declined all screening tests for fetal aneuploidy in
 her current pregnancy.
 She was informed that the fetal growth measures large for
 her gestational age.  The amniotic fluid level was appropriate
 The views of the fetal anatomy were limited today due to the
 maternal body habitus and the fetal position.
 The patient was informed that anomalies may be missed due
 to technical limitations. If the fetus is in a suboptimal position
 or maternal habitus is increased, visualization of the fetus in
 the maternal uterus may be impaired.
 The increased risk of fetal aneuploidy due to advanced
 maternal age was discussed. Due to advanced maternal age,
 the patient was offered and declined an amniocentesis today
 for definitive diagnosis of fetal aneuploidy.  She was also
 advised that she may have a screening test for fetal
 aneuploidy drawn today.  She declined this option as an
 aneuploid fetus would not change the outcome of her current
 pregnancy.
 The implications and management of chronic hypertension in
 pregnancy was discussed. The patient was advised that
 should her blood pressures continue to be elevated, the
 dosage of her antihypertensive medications may need to be
 increased.  The increased risk of superimposed
 preeclampsia, an indicated preterm delivery, and possible
 fetal growth restriction due to chronic hypertension in
 pregnancy was discussed. The patient was advised that we
 will continue to follow her closely throughout her pregnancy.
 The implications and management of diabetes in pregnancy
 was discussed in detail with the patient. She was advised that
 our goals for her fingerstick values are fasting values of 90-95
 or less and two-hour postprandials of 120 or less.  Should her
 fingerstick values be above these values, her insulin or
 Metformin dosages may have to be adjusted to help her
 achieve better glycemic control. The patient was advised that
 getting her fingerstick values as close to these goals as
 possible would provide her with the most optimal obstetrical
 outcome.
 Due to pregestational diabetes, she was referred for a fetal
 echocardiogram with [facility].
 The implications and management of the antiphospholipid
 antibody syndrome in pregnancy was discussed in detail
 today.  She was advised that women with the OGOSE syndrome
 may be at increased risk for adverse pregnancy outcomes
 such as fetal growth restriction, early onset severe
 preeclampsia, IUFD, and thromboembolic events.  Due to the
 OGOSE syndrome, she should continue Lovenox for the duration
 of her pregnancy.  Due to her increased BMI, I would
 recommend that she be placed on a prophylactic dose of
 Lovenox using a dosage of 1 mg/kg/day.  The patient reports
 that she weighs about 230 pounds which is roughly 100 kg.
 Therefore, I would recommend that her Lovenox dose be
 increased to 100 mg daily.  She should continue taking a
 daily baby aspirin for preeclampsia prophylaxis.
 A follow-up exam was scheduled in 4 weeks to assess the
 fetal growth and to complete the views of the fetal anatomy.
 We will continue to follow her with serial growth ultrasounds.
 Due to her significant underlying comorbidities, I would
 recommend she start twice-weekly fetal testing at around 32
 weeks.
 A total of 30 minutes was spent counseling and coordinating
 the care for this patient.  Greater than 50% of the time was
 spent in direct face-to-face contact.
Recommendations

 Increase prophylactic Lovenox dose to a weight-based
 regimen of 1 mg/kg/[AGE] mg daily)
 Continue insulin and Metformin to maintain normal glycemic
 control
 Continue Norvasc for blood pressure control
 Continue a daily baby aspirin for preeclampsia prophylaxis
 Serial growth ultrasounds
 Twice-weekly fetal testing starting at 32 weeks

## 2020-04-08 MED ORDER — ENOXAPARIN SODIUM 40 MG/0.4ML ~~LOC~~ SOLN
100.0000 mg | SUBCUTANEOUS | 6 refills | Status: DC
Start: 2020-04-08 — End: 2020-04-08

## 2020-04-08 MED ORDER — ENOXAPARIN SODIUM 100 MG/ML ~~LOC~~ SOLN: 100.0000 mg | SUBCUTANEOUS | 2 refills | Status: DC

## 2020-04-08 NOTE — Progress Notes (Signed)
PRENATAL VISIT NOTE  Subjective:  Kelli Miller is a 39 y.o. [id number] at [redacted]w[redacted]d being seen today for ongoing prenatal care.  She is currently monitored for the following issues for this high-risk pregnancy and has Advanced maternal age in multigravida; Preexisting hypertension complicating pregnancy, antepartum; PCOS (polycystic ovarian syndrome); GERD (gastroesophageal reflux disease); Psoriasis; Hypothyroidism; Diabetes mellitus ([place]); B12 deficiency; Vitamin D deficiency; Family history of premature CAD; History of antiphospholipid syndrome; Supervision of high risk pregnancy, antepartum; Preexisting diabetes complicating pregnancy, antepartum; Hypothyroid in pregnancy, antepartum; History of severe preeclampsia superimposed on chronic hypertension in prior pregnancy; History of cesarean section for failure to progress; Maternal morbid obesity, antepartum ([place]); Left ankle pain; and Unwanted fertility on their problem list.  Patient reports no complaints.  Contractions: Regular. Vag. Bleeding: None.  Movement: Present. Denies leaking of fluid.   The following portions of the patient's history were reviewed and updated as appropriate: allergies, current medications, past family history, past medical history, past social history, past surgical history and problem list.   Objective:   Vitals:   04/08/20 1311  BP: 133/78  Pulse: (!) 112  Weight: 238 lb (108 kg)    Fetal Status: Fetal Heart Rate (bpm): 147   Movement: Present     General:  Alert, oriented and cooperative. Patient is in no acute distress.  Skin: Skin is warm and dry. No rash noted.   Cardiovascular: Normal heart rate noted  Respiratory: Normal respiratory effort, no problems with respiration noted  Abdomen: Soft, gravid, appropriate for gestational age.  Pain/Pressure: Absent     Pelvic: Cervical exam deferred        Extremities: Normal range of motion.  Edema: None  Mental Status: Normal mood and affect. Normal behavior.  Normal judgment and thought content.   Assessment and Plan:  Pregnancy: [id number] at [redacted]w[redacted]d  1. Supervision of high risk pregnancy, antepartum  2. Unwanted fertility For BTL  3. History of cesarean section for failure to progress Reviewed risks/benefits of TOLAC versus RCS in detail. Patient counseled regarding potential vaginal delivery, chance of success, future implications, possible uterine rupture and need for urgent/emergent repeat cesarean. Counseled regarding potential need for repeat c-section for reasons unrelated to first c-section. Counseled regarding scheduled repeat cesarean including risks of bleeding, infection, damage to surrounding tissue, abnormal placentation, implications for future pregnancies. All questions answered.  Patient desires RCS, consent signed 04/08/2020  - RCS+BTL requested for 39 weeks  4. History of severe preeclampsia superimposed on chronic hypertension in prior pregnancy  5. Hypothyroid in pregnancy, antepartum Cont euthyrox 50 mcg  6. Preexisting hypertension complicating pregnancy, antepartum Cont norvasc 10 mg Cont baby ASA  7. Preexisting diabetes complicating pregnancy, antepartum Fetal echo ordered NPH 44 am, 40 QHS novolog 9 with breakfast, 4 u with lunch, 9 u with dinner - CBGs improved, FG about 50% > 95, will increase NPH 40 QHS to 42 - will f/u with endo  8. Antiphospholipid antibody syndrome ([facility]) On lovenox 40 mg daily Per MFM, increase to 100 mg daily for ppx due to weight, script sent to pharmacy   Preterm labor symptoms and general obstetric precautions including but not limited to vaginal bleeding, contractions, leaking of fluid and fetal movement were reviewed in detail with the patient. Please refer to After Visit Summary for other counseling recommendations.   Return in about 3 weeks (around 04/29/2020) for virtual, high OB.  Future Appointments  Date Time Provider [place]  05/06/2020  9:15 AM Sloan Leiter, MD  [facility]  05/06/2020 12:45 PM [facility] US5 [facility]    Conan Bowens, MD

## 2020-04-08 NOTE — Addendum Note (Signed)
Addended by: [name] on: 04/08/2020 03:43 PM   Modules accepted: Orders

## 2020-04-08 NOTE — Consult Note (Signed)
MFM Note  This patient was seen for a detailed fetal anatomy scan due to advanced maternal age, pregestational diabetes currently treated with Metformin and insulin, chronic hypertension currently treated with Norvasc, and the antiphospholipid antibody syndrome.  The patient reports that she was diagnosed with diabetes about 5 years ago.  Her hemoglobin A1c at the time of conception was 5.5%.  She was diagnosed with chronic hypertension 3 to 4 years ago.  She reports that she met the criteria for the diagnosis of the antiphospholipid antibody syndrome based on her three first trimester miscarriages and as she has screened positive for one of the antiphospholipid antibodies.  She denies any history of a prior thromboembolic event.  Due to the antiphospholipid antibody syndrome, she is currently treated with prophylactic Lovenox 40 mg daily.  Her pregnancy has also been complicated by hypothyroidism and maternal obesity.  She has declined all screening tests for fetal aneuploidy in her current pregnancy.  She was informed that the fetal growth measures large for her gestational age.  The amniotic fluid level was appropriate  The views of the fetal anatomy were limited today due to the maternal body habitus and the fetal position.  The patient was informed that anomalies may be missed due to technical limitations. If the fetus is in a suboptimal position or maternal habitus is increased, visualization of the fetus in the maternal uterus may be impaired.  The increased risk of fetal aneuploidy due to advanced maternal age was discussed. Due to advanced maternal age, the patient was offered and declined an amniocentesis today for definitive diagnosis of fetal aneuploidy.  She was also advised that she may have a screening test for fetal aneuploidy drawn today.  She declined this option as an aneuploid fetus would not change the outcome of her current pregnancy.  The implications and management of chronic  hypertension in pregnancy was discussed. The patient was advised that should her blood pressures continue to be elevated, the dosage of her antihypertensive medications may need to be increased.  The increased risk of superimposed preeclampsia, an indicated preterm delivery, and possible fetal growth restriction due to chronic hypertension in pregnancy was discussed. The patient was advised that we will continue to follow her closely throughout her pregnancy.  The implications and management of diabetes in pregnancy was discussed in detail with the patient. She was advised that our goals for her fingerstick values are fasting values of 90-95 or less and two-hour postprandials of 120 or less.  Should her fingerstick values be above these values, her insulin or Metformin dosages may have to be adjusted to help her achieve better glycemic control. The patient was advised that getting her fingerstick values as close to these goals as possible would provide her with the most optimal obstetrical outcome.  Due to pregestational diabetes, she was referred for a fetal echocardiogram with [facility].  The implications and management of the antiphospholipid antibody syndrome in pregnancy was discussed in detail today.  She was advised that women with the APA syndrome may be at increased risk for adverse pregnancy outcomes such as fetal growth restriction, early onset severe preeclampsia, IUFD, and thromboembolic events.  Due to the APA syndrome, she should continue Lovenox for the duration of her pregnancy.  Due to her increased BMI, I would recommend that she be placed on a prophylactic dose of Lovenox using a dosage of 1 mg/kg/day.  The patient reports that she weighs about 230 pounds which is roughly 100 kg.  Therefore,  I would recommend that her Lovenox dose be increased to 100 mg daily.  She should continue taking a daily baby aspirin for preeclampsia prophylaxis.   A follow-up exam was scheduled in  4 weeks to assess the fetal growth and to complete the views of the fetal anatomy.  We will continue to follow her with serial growth ultrasounds.  Due to her significant underlying comorbidities, I would recommend she start twice-weekly fetal testing at around 32 weeks.  A total of 30 minutes was spent counseling and coordinating the care for this patient.  Greater than 50% of the time was spent in direct face-to-face contact.  Recommendations:  Increase prophylactic Lovenox dose to a weight-based regimen of 1 mg/kg/day (100 mg daily) Continue insulin and Metformin to maintain normal glycemic control Continue Norvasc for blood pressure control Continue a daily baby aspirin for preeclampsia prophylaxis Serial growth ultrasounds Twice-weekly fetal testing starting at 32 weeks

## 2020-04-09 ENCOUNTER — Ambulatory Visit: Payer: PRIVATE HEALTH INSURANCE | Attending: Obstetrics

## 2020-04-18 ENCOUNTER — Other Ambulatory Visit: Payer: Self-pay | Admitting: Obstetrics & Gynecology

## 2020-04-23 ENCOUNTER — Other Ambulatory Visit: Payer: Self-pay | Admitting: Osteopathic Medicine

## 2020-05-06 ENCOUNTER — Other Ambulatory Visit: Payer: Self-pay | Admitting: *Deleted

## 2020-05-06 ENCOUNTER — Ambulatory Visit: Payer: PRIVATE HEALTH INSURANCE | Attending: Obstetrics

## 2020-05-06 ENCOUNTER — Encounter: Payer: Self-pay | Admitting: Obstetrics and Gynecology

## 2020-05-06 ENCOUNTER — Other Ambulatory Visit: Payer: Self-pay

## 2020-05-06 ENCOUNTER — Telehealth (INDEPENDENT_AMBULATORY_CARE_PROVIDER_SITE_OTHER): Payer: PRIVATE HEALTH INSURANCE | Admitting: Obstetrics and Gynecology

## 2020-05-06 ENCOUNTER — Ambulatory Visit: Payer: PRIVATE HEALTH INSURANCE | Admitting: *Deleted

## 2020-05-06 ENCOUNTER — Other Ambulatory Visit: Payer: Self-pay | Admitting: Obstetrics

## 2020-05-06 VITALS — BP 125/83 | HR 100 | Wt 238.0 lb

## 2020-05-06 DIAGNOSIS — Z8759 Personal history of other complications of pregnancy, childbirth and the puerperium: Secondary | ICD-10-CM

## 2020-05-06 DIAGNOSIS — O10919 Unspecified pre-existing hypertension complicating pregnancy, unspecified trimester: Secondary | ICD-10-CM

## 2020-05-06 DIAGNOSIS — E039 Hypothyroidism, unspecified: Secondary | ICD-10-CM | POA: Insufficient documentation

## 2020-05-06 DIAGNOSIS — O0990 Supervision of high risk pregnancy, unspecified, unspecified trimester: Secondary | ICD-10-CM

## 2020-05-06 DIAGNOSIS — O99212 Obesity complicating pregnancy, second trimester: Secondary | ICD-10-CM

## 2020-05-06 DIAGNOSIS — O24112 Pre-existing diabetes mellitus, type 2, in pregnancy, second trimester: Secondary | ICD-10-CM

## 2020-05-06 DIAGNOSIS — Z3009 Encounter for other general counseling and advice on contraception: Secondary | ICD-10-CM | POA: Diagnosis present

## 2020-05-06 DIAGNOSIS — Z362 Encounter for other antenatal screening follow-up: Secondary | ICD-10-CM

## 2020-05-06 DIAGNOSIS — O10912 Unspecified pre-existing hypertension complicating pregnancy, second trimester: Secondary | ICD-10-CM

## 2020-05-06 DIAGNOSIS — O09522 Supervision of elderly multigravida, second trimester: Secondary | ICD-10-CM

## 2020-05-06 DIAGNOSIS — O99280 Endocrine, nutritional and metabolic diseases complicating pregnancy, unspecified trimester: Secondary | ICD-10-CM | POA: Insufficient documentation

## 2020-05-06 DIAGNOSIS — O99112 Other diseases of the blood and blood-forming organs and certain disorders involving the immune mechanism complicating pregnancy, second trimester: Secondary | ICD-10-CM

## 2020-05-06 DIAGNOSIS — O99282 Endocrine, nutritional and metabolic diseases complicating pregnancy, second trimester: Secondary | ICD-10-CM

## 2020-05-06 DIAGNOSIS — O09292 Supervision of pregnancy with other poor reproductive or obstetric history, second trimester: Secondary | ICD-10-CM | POA: Diagnosis not present

## 2020-05-06 DIAGNOSIS — Z3A23 23 weeks gestation of pregnancy: Secondary | ICD-10-CM

## 2020-05-06 DIAGNOSIS — E669 Obesity, unspecified: Secondary | ICD-10-CM

## 2020-05-06 DIAGNOSIS — D6861 Antiphospholipid syndrome: Secondary | ICD-10-CM

## 2020-05-06 DIAGNOSIS — O10012 Pre-existing essential hypertension complicating pregnancy, second trimester: Secondary | ICD-10-CM

## 2020-05-06 DIAGNOSIS — O24319 Unspecified pre-existing diabetes mellitus in pregnancy, unspecified trimester: Secondary | ICD-10-CM

## 2020-05-06 DIAGNOSIS — Z98891 History of uterine scar from previous surgery: Secondary | ICD-10-CM

## 2020-05-06 DIAGNOSIS — E119 Type 2 diabetes mellitus without complications: Secondary | ICD-10-CM

## 2020-05-06 DIAGNOSIS — O0992 Supervision of high risk pregnancy, unspecified, second trimester: Secondary | ICD-10-CM

## 2020-05-06 IMAGING — US US MFM OB FOLLOW-UP
1 series · 13 of 28 positions shown · non-contrast
Comparison: none

[Series 1: us mfm ob follow-up · 13 of 111 slices shown]
[im 5/111]
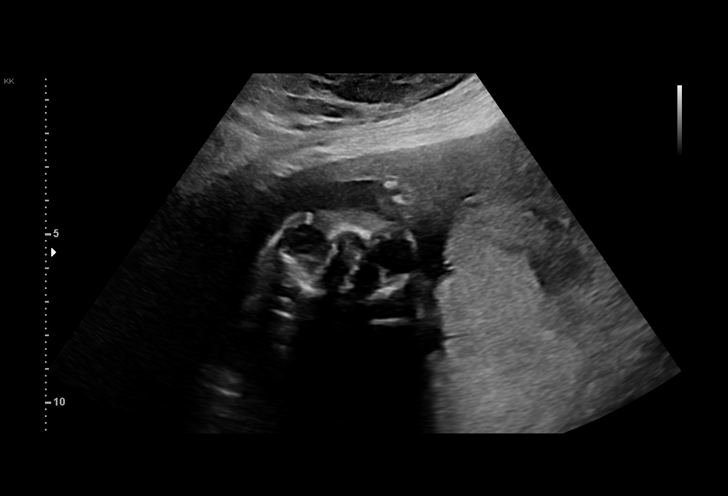
[im 13/111]
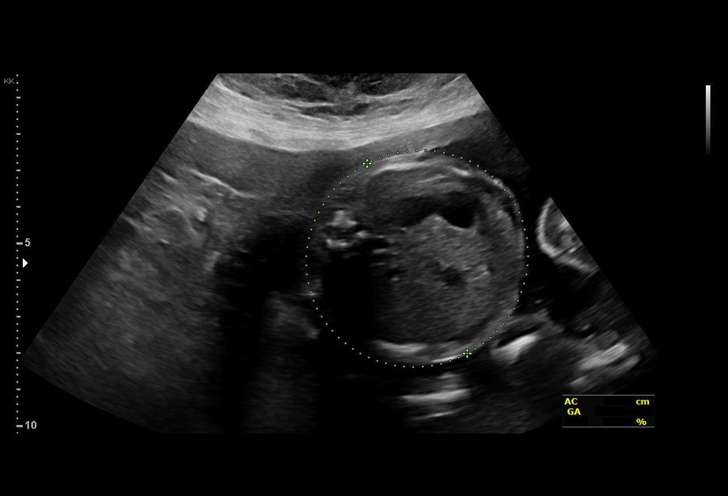
[im 21/111]
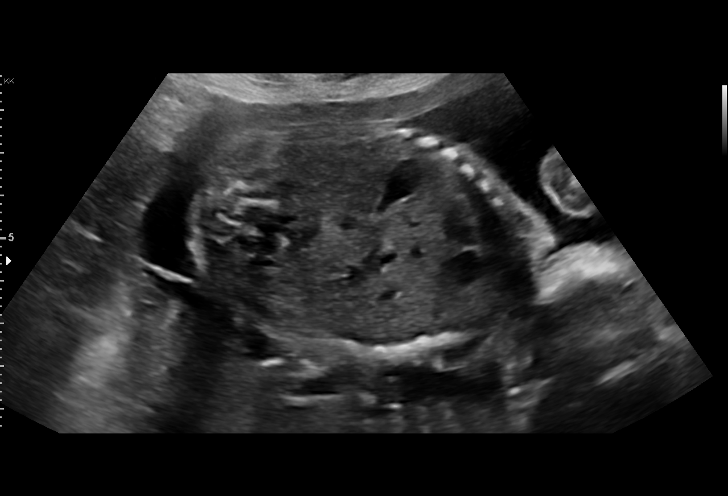
[im 29/111]
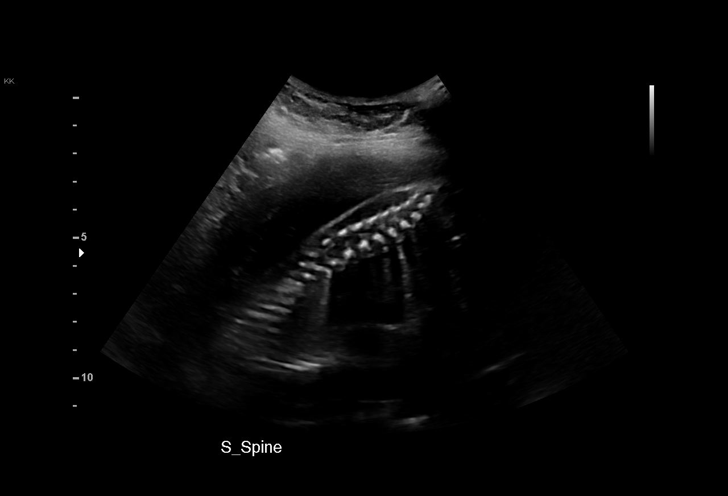
[im 37/111]
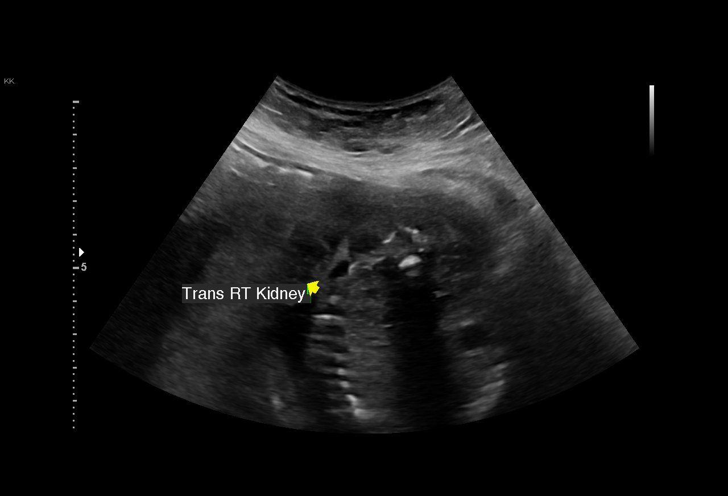
[im 45/111]
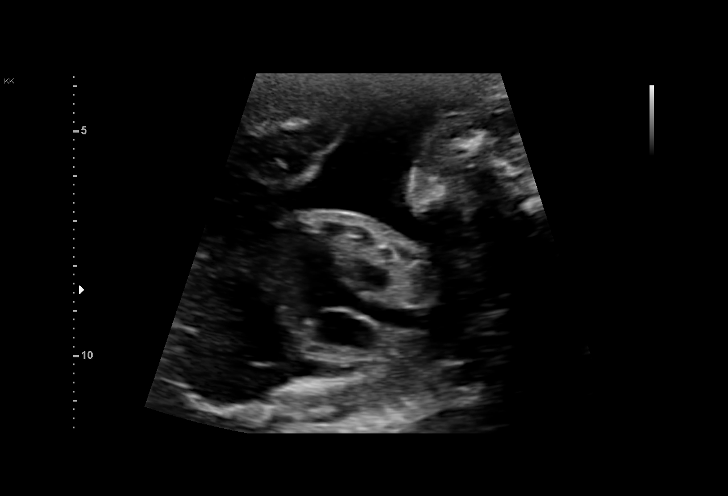
[im 58/111]
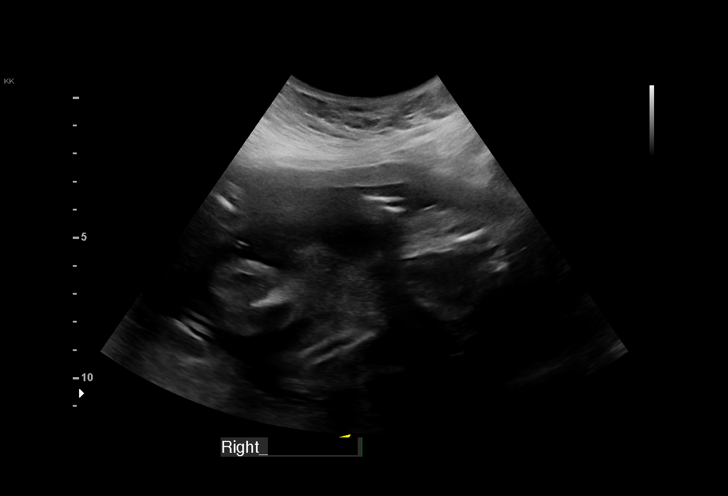
[im 66/111]
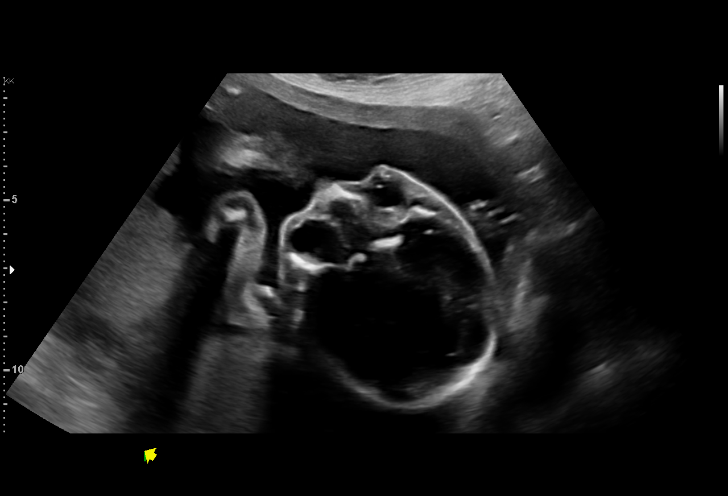
[im 74/111]
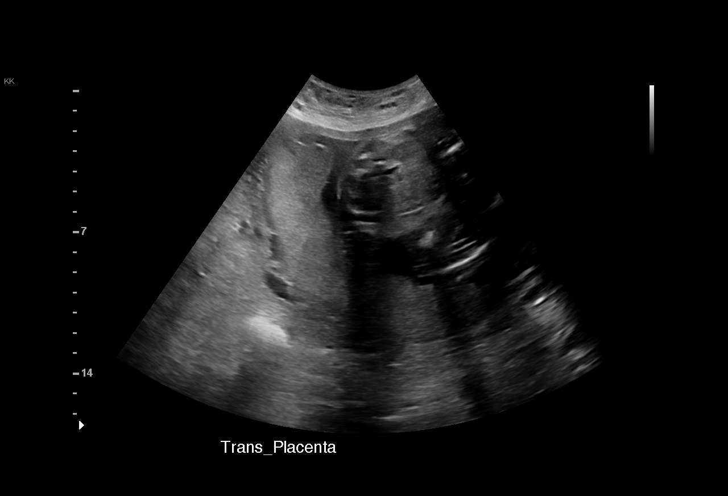
[im 82/111]
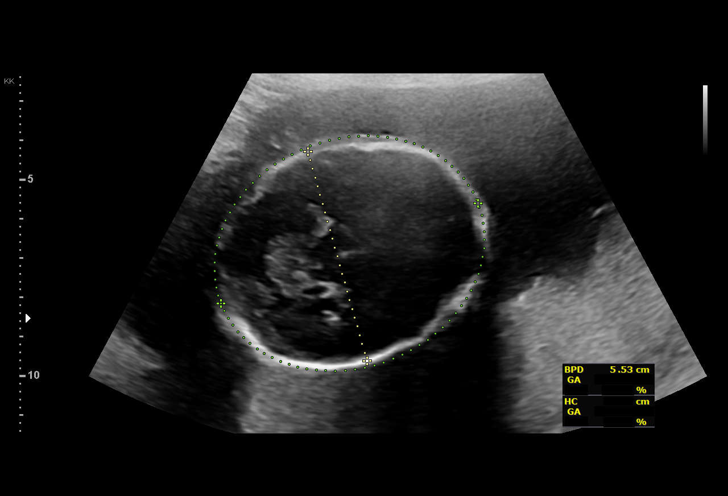
[im 90/111]
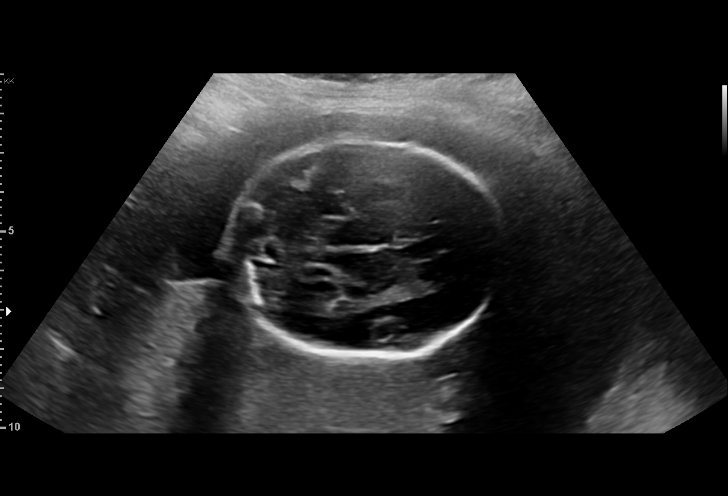
[im 98/111]
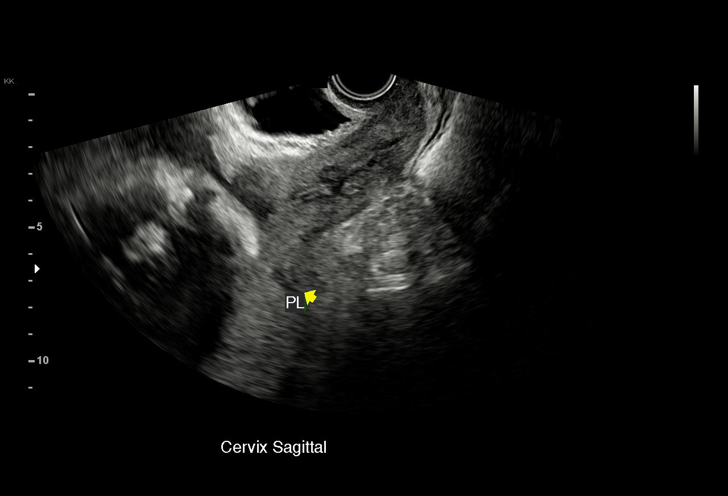
[im 106/111]
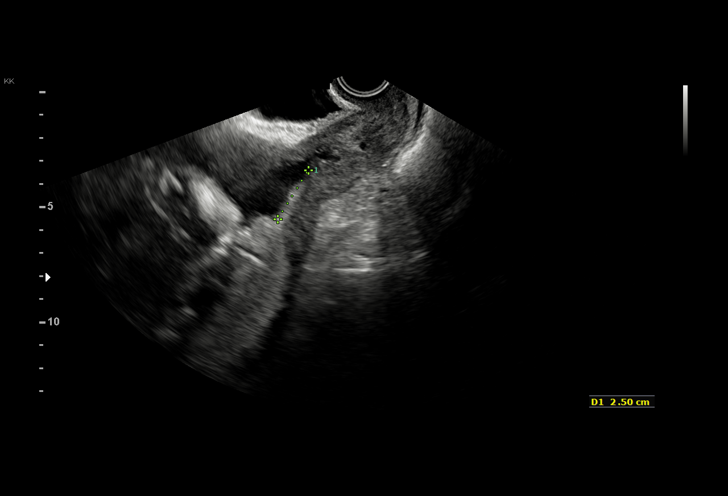

[13 of 28 positions shown; findings below may reference images not displayed]

Indications

 Advanced maternal age multigravida 35+,        [1E]
 second trimester
 Hypertension - Chronic/Pre-existing            [1E]
 Obesity complicating pregnancy, second         [1E]
 trimester (BMI 43)
 Pre-existing diabetes, type 2, in pregnancy,   [1E]
 second trimester
 Antiphospholipid syndrome complicating         O99.119, [1E]
 pregnancy, antepartum
 Hypothyroid                                    [1E] [1E]
 History of cesarean delivery, currently        [1E]
 pregnant
 Poor obstetric history: Previous               [1E]
 preeclampsia / eclampsia/gestational HTN
 23 weeks gestation of pregnancy
 Encounter for other antenatal screening        [1E]
 follow-up
Vital Signs

 BMI:
Fetal Evaluation

 Num Of Fetuses:         1
 Fetal Heart Rate(bpm):  155
 Cardiac Activity:       Observed
 Presentation:           Cephalic
 Placenta:               Posterior
 P. Cord Insertion:      Previously Visualized
 Amniotic Fluid
 AFI FV:      Within normal limits

                             Largest Pocket(cm)

Biometry

 BPD:      54.7  mm     G. Age:  22w 5d         32  %    CI:        74.41   %    70 - 86
                                                         FL/HC:      20.0   %    19.2 -
 HC:      201.3  mm     G. Age:  22w 2d         12  %    HC/AC:      1.07        1.05 -
 AC:      188.7  mm     G. Age:  23w 4d         62  %    FL/BPD:     73.5   %    71 - 87
 FL:       40.2  mm     G. Age:  23w 0d         38  %    FL/AC:      21.3   %    20 - 24

 Est. FW:     573  gm      1 lb 4 oz     53  %
OB History

 Blood Type:   A+
 Maternal Racial/Ethnic Group:   White
 Gravidity:    5         Term:   1        Prem:   0        SAB:   3
 TOP:          0       Ectopic:  0        Living: 1
Gestational Age

 LMP:           23w 0d        Date:  [DATE]                 EDD:   [DATE]
 U/S Today:     22w 6d                                        EDD:   [DATE]
 Best:          23w 0d     Det. By:  LMP  ([DATE])          EDD:   [DATE]
Anatomy

 Cranium:               Appears normal         Aortic Arch:            Not well visualized
 Cavum:                 Appears normal         Ductal Arch:            Appears normal
 Ventricles:            Appears normal         Diaphragm:              Appears normal
 Choroid Plexus:        Appears normal         Stomach:                Appears normal, left
                                                                       sided
 Cerebellum:            Appears normal         Abdomen:                Appears normal
 Posterior Fossa:       Appears normal         Abdominal Wall:         Appears nml (cord
                                                                       insert, abd wall)
 Nuchal Fold:           Not applicable (>20    Cord Vessels:           Appears normal (3
                        wks GA)                                        vessel cord)
 Face:                  Appears normal         Kidneys:                Appear normal
                        (orbits and profile)
 Lips:                  Appears normal         Bladder:                Appears normal
 Thoracic:              Appears normal         Spine:                  Not well visualized
 Heart:                 Appears normal         Upper Extremities:      Appears normal
                        (4CH, axis, and
                        situs)
 RVOT:                  Appears normal         Lower Extremities:      Appears normal
 LVOT:                  Appears normal

 Other:  Male gender. Technically difficult due to maternal habitus and fetal
         position.
Cervix Uterus Adnexa

 Cervix
 Length:              4  cm.
 Measured transvaginally.
Impression

 Patient returned for completion of fetal anatomy .Amniotic
 fluid is normal and good fetal activity is seen .Fetal growth is
 appropriate for gestational age .

 We performed transvaginal ultrasound to evaluate the
 placental location. Although the internal os could not be
 identified clearly, the placental edge seems away (no
 evidence of previa or low-lying placenta). Patient does not
 give history of vaginal bleeding.
Recommendations

 -An appointment was made for her to return in 4 weeks for
 fetal growth assessment (chronic hypertension).
 -Transvaginal ultrasound if the patient gives history of vaginal
 bleeding.
                 DE ANTONI

## 2020-05-06 MED ORDER — INSULIN NPH (HUMAN) (ISOPHANE) 100 UNIT/ML ~~LOC~~ SUSP: 50.0000 [IU] | Freq: Two times a day (BID) | SUBCUTANEOUS | 3 refills | Status: DC

## 2020-05-06 NOTE — Progress Notes (Signed)
OBSTETRICS PRENATAL VIRTUAL VISIT ENCOUNTER NOTE  Provider location: Center for [facility] [place]   I connected with [name] on 05/06/20 at  9:15 AM EDT by MyChart Video Encounter at home and verified that I am speaking with the correct person using two identifiers.   I discussed the limitations, risks, security and privacy concerns of performing an evaluation and management service virtually and the availability of in person appointments. I also discussed with the patient that there may be a patient responsible charge related to this service. The patient expressed understanding and agreed to proceed. Subjective:  Kelli Miller is a 39 y.o. [phone] at [redacted]w[redacted]d being seen today for ongoing prenatal care.  She is currently monitored for the following issues for this high-risk pregnancy and has Advanced maternal age in multigravida; Preexisting hypertension complicating pregnancy, antepartum; PCOS (polycystic ovarian syndrome); GERD (gastroesophageal reflux disease); Psoriasis; Hypothyroidism; Diabetes mellitus (HCC); B12 deficiency; Vitamin D deficiency; Family history of premature CAD; History of antiphospholipid syndrome; Supervision of high risk pregnancy, antepartum; Preexisting diabetes complicating pregnancy, antepartum; Hypothyroid in pregnancy, antepartum; History of severe preeclampsia superimposed on chronic hypertension in prior pregnancy; History of cesarean section for failure to progress; Maternal morbid obesity, antepartum (HCC); Left ankle pain; and Unwanted fertility on their problem list.  Patient reports no complaints. Getting readings of 130-140s/80-90s then recheck is 120s/80s Contractions: Not present. Vag. Bleeding: None.  Movement: Present. Denies any leaking of fluid.   The following portions of the patient's history were reviewed and updated as appropriate: allergies, current medications, past family history, past medical history, past social history,  past surgical history and problem list.   Objective:   Vitals:   05/06/20 0901  BP: 125/83  Pulse: 100  Weight: 238 lb (108 kg)    Fetal Status:     Movement: Present     General:  Alert, oriented and cooperative. Patient is in no acute distress.  Respiratory: Normal respiratory effort, no problems with respiration noted  Mental Status: Normal mood and affect. Normal behavior. Normal judgment and thought content.  Rest of physical exam deferred due to type of encounter  Imaging:   Assessment and Plan:  Pregnancy: G5P1031 at [redacted]w[redacted]d 1. Unwanted fertility For BTL  2. Preexisting diabetes complicating pregnancy, antepartum On novolin N 50 u BID and novolog 11 units with breakfast and dinner, 5 with lunch Reviewed CBGs (pt sent pic of log in) FG mostly elevated Increase nightly novolin to 54 units Keep other doses the same Cont metformin F/u growth [place] today Fetal echo scheduled for tomorrow Pt to schedule eye appointment  3. Hypothyroid in pregnancy, antepartum Repeat TSH with 3rd trim labs  4. Supervision of high risk pregnancy, antepartum  5. Preexisting hypertension complicating pregnancy, antepartum Elevated BP with initial at home, then recheck is normal Advised pt to sit ten min then check BP and see if initial remains elevated Cont norvasc 10 mg  Cont baby aspirin  Preterm labor symptoms and general obstetric precautions including but not limited to vaginal bleeding, contractions, leaking of fluid and fetal movement were reviewed in detail with the patient. I discussed the assessment and treatment plan with the patient. The patient was provided an opportunity to ask questions and all were answered. The patient agreed with the plan and demonstrated an understanding of the instructions. The patient was advised to call back or seek an in-person office evaluation/go to [facility] at [facility] for any urgent or concerning symptoms. Please refer to  After Visit  Summary for other counseling recommendations.   I provided 15 minutes of face-to-face time during this encounter.  Return in about 2 weeks (around 05/20/2020) for high OB, virtual.  Future Appointments  Date Time Provider Department Center  05/06/2020 12:45 PM [facility] NURSE [facility]  05/06/2020 12:45 PM [facility] US5 [facility]    Conan Bowens, MD Center for [facility], [facility]

## 2020-05-07 ENCOUNTER — Encounter: Payer: Self-pay | Admitting: Pediatric Cardiology

## 2020-05-20 ENCOUNTER — Telehealth (INDEPENDENT_AMBULATORY_CARE_PROVIDER_SITE_OTHER): Payer: PRIVATE HEALTH INSURANCE | Admitting: Obstetrics & Gynecology

## 2020-05-20 ENCOUNTER — Encounter: Payer: Self-pay | Admitting: Obstetrics & Gynecology

## 2020-05-20 VITALS — BP 143/82 | Wt 240.0 lb

## 2020-05-20 DIAGNOSIS — O99282 Endocrine, nutritional and metabolic diseases complicating pregnancy, second trimester: Secondary | ICD-10-CM

## 2020-05-20 DIAGNOSIS — O10919 Unspecified pre-existing hypertension complicating pregnancy, unspecified trimester: Secondary | ICD-10-CM

## 2020-05-20 DIAGNOSIS — Z98891 History of uterine scar from previous surgery: Secondary | ICD-10-CM

## 2020-05-20 DIAGNOSIS — Z3A25 25 weeks gestation of pregnancy: Secondary | ICD-10-CM

## 2020-05-20 DIAGNOSIS — E039 Hypothyroidism, unspecified: Secondary | ICD-10-CM

## 2020-05-20 DIAGNOSIS — O24319 Unspecified pre-existing diabetes mellitus in pregnancy, unspecified trimester: Secondary | ICD-10-CM

## 2020-05-20 DIAGNOSIS — O0990 Supervision of high risk pregnancy, unspecified, unspecified trimester: Secondary | ICD-10-CM

## 2020-05-20 DIAGNOSIS — O0992 Supervision of high risk pregnancy, unspecified, second trimester: Secondary | ICD-10-CM

## 2020-05-20 DIAGNOSIS — Z8759 Personal history of other complications of pregnancy, childbirth and the puerperium: Secondary | ICD-10-CM

## 2020-05-20 DIAGNOSIS — E119 Type 2 diabetes mellitus without complications: Secondary | ICD-10-CM

## 2020-05-20 DIAGNOSIS — E559 Vitamin D deficiency, unspecified: Secondary | ICD-10-CM

## 2020-05-20 DIAGNOSIS — Z3009 Encounter for other general counseling and advice on contraception: Secondary | ICD-10-CM

## 2020-05-20 DIAGNOSIS — O10912 Unspecified pre-existing hypertension complicating pregnancy, second trimester: Secondary | ICD-10-CM

## 2020-05-20 MED ORDER — LABETALOL HCL 100 MG PO TABS: 200.0000 mg | ORAL_TABLET | Freq: Two times a day (BID) | ORAL | 3 refills | Status: DC

## 2020-05-20 NOTE — Progress Notes (Signed)
° °  PRENATAL VISIT NOTE I connected with Kelli Miller 05/20/20 at  9:45 AM EDT by: MyChart video and verified that I am speaking with the correct person using two identifiers.  Patient is located at home and provider is located at [place] office.     The purpose of this virtual visit is to provide medical care while limiting exposure to the novel coronavirus. I discussed the limitations, risks, security and privacy concerns of performing an evaluation and management service by MyChart video and the availability of in person appointments. I also discussed with the patient that there may be a patient responsible charge related to this service. By engaging in this virtual visit, you consent to the provision of healthcare.  Additionally, you authorize for your insurance to be billed for the services provided during this visit.  The patient expressed understanding and agreed to proceed.  Subjective:  Kelli Miller is a 39 y.o. [phone] at [redacted]w[redacted]d being seen today for ongoing prenatal care.  She is currently monitored for the following issues for this high-risk pregnancy and has Advanced maternal age in multigravida; Preexisting hypertension complicating pregnancy, antepartum; PCOS (polycystic ovarian syndrome); GERD (gastroesophageal reflux disease); Psoriasis; Hypothyroidism; Diabetes mellitus (HCC); B12 deficiency; Vitamin D deficiency; Family history of premature CAD; History of antiphospholipid syndrome; Supervision of high risk pregnancy, antepartum; Preexisting diabetes complicating pregnancy, antepartum; Hypothyroid in pregnancy, antepartum; History of severe preeclampsia superimposed on chronic hypertension in prior pregnancy; History of cesarean section for failure to progress; Maternal morbid obesity, antepartum (HCC); Left ankle pain; and Unwanted fertility on their problem list.  Patient reports no complaints.   .  .   . Denies leaking of fluid.   The following portions of the patient's history  were reviewed and updated as appropriate: allergies, current medications, past family history, past medical history, past social history, past surgical history and problem list.   Objective:   Vitals:   05/20/20 0944  BP: (!) 143/82  Weight: (!) 240 lb (108.9 kg)    Self-Obtained   Assessment and Plan:  Pregnancy: G5P1031 at [redacted]w[redacted]d 1. Unwanted fertility BTL planed  2. Preexisting diabetes complicating pregnancy, antepartum [place] still elevated.  She has [facility] endocrine managing her DM.  Fastings elevated even after increase of QHS NPH increase to 60 several days ago.  Pt agrees to send CBGs to the endocrinologist for further medication management and to let us know of changes.  One hour pp are normal    3. Hypothyroid in pregnancy, antepartum TSH next visit  4. CHTN in Pregnancy BP elevated.  Retake was 155/80.  Add labetalol 200 mg bid.  Check PIH labs.  Pt to take BP daily and report to [place].    Preterm labor symptoms and general obstetric precautions including but not limited to vaginal bleeding, contractions, leaking of fluid and fetal movement were reviewed in detail with the patient. Please refer to After Visit Summary for other counseling recommendations.   No follow-ups on file.  Future Appointments  Date Time Provider Department Center  06/03/2020  1:45 PM [facility] NURSE [facility]  06/03/2020  2:00 PM [facility] US1 [facility]   Time spent on virtual visit: 12 minutes  Elsie Lincoln, MD

## 2020-05-27 ENCOUNTER — Encounter: Payer: Self-pay | Admitting: *Deleted

## 2020-05-30 ENCOUNTER — Ambulatory Visit (INDEPENDENT_AMBULATORY_CARE_PROVIDER_SITE_OTHER): Payer: PRIVATE HEALTH INSURANCE

## 2020-05-30 ENCOUNTER — Other Ambulatory Visit: Payer: Self-pay

## 2020-05-30 DIAGNOSIS — Z3A26 26 weeks gestation of pregnancy: Secondary | ICD-10-CM

## 2020-05-30 DIAGNOSIS — Z3480 Encounter for supervision of other normal pregnancy, unspecified trimester: Secondary | ICD-10-CM

## 2020-05-30 DIAGNOSIS — Z3482 Encounter for supervision of other normal pregnancy, second trimester: Secondary | ICD-10-CM

## 2020-05-30 NOTE — Progress Notes (Signed)
Pt given lab orders and sent to the lab

## 2020-06-03 ENCOUNTER — Ambulatory Visit (INDEPENDENT_AMBULATORY_CARE_PROVIDER_SITE_OTHER): Payer: PRIVATE HEALTH INSURANCE | Admitting: Obstetrics and Gynecology

## 2020-06-03 ENCOUNTER — Ambulatory Visit: Payer: PRIVATE HEALTH INSURANCE | Attending: Obstetrics and Gynecology

## 2020-06-03 ENCOUNTER — Other Ambulatory Visit: Payer: Self-pay

## 2020-06-03 ENCOUNTER — Encounter: Payer: Self-pay | Admitting: *Deleted

## 2020-06-03 ENCOUNTER — Ambulatory Visit: Payer: PRIVATE HEALTH INSURANCE | Admitting: *Deleted

## 2020-06-03 VITALS — BP 128/66 | HR 102 | Wt 250.0 lb

## 2020-06-03 DIAGNOSIS — E039 Hypothyroidism, unspecified: Secondary | ICD-10-CM

## 2020-06-03 DIAGNOSIS — O10912 Unspecified pre-existing hypertension complicating pregnancy, second trimester: Secondary | ICD-10-CM

## 2020-06-03 DIAGNOSIS — O10919 Unspecified pre-existing hypertension complicating pregnancy, unspecified trimester: Secondary | ICD-10-CM

## 2020-06-03 DIAGNOSIS — O99212 Obesity complicating pregnancy, second trimester: Secondary | ICD-10-CM

## 2020-06-03 DIAGNOSIS — O24414 Gestational diabetes mellitus in pregnancy, insulin controlled: Secondary | ICD-10-CM

## 2020-06-03 DIAGNOSIS — O24319 Unspecified pre-existing diabetes mellitus in pregnancy, unspecified trimester: Secondary | ICD-10-CM

## 2020-06-03 DIAGNOSIS — Z23 Encounter for immunization: Secondary | ICD-10-CM

## 2020-06-03 DIAGNOSIS — O0990 Supervision of high risk pregnancy, unspecified, unspecified trimester: Secondary | ICD-10-CM

## 2020-06-03 DIAGNOSIS — O99112 Other diseases of the blood and blood-forming organs and certain disorders involving the immune mechanism complicating pregnancy, second trimester: Secondary | ICD-10-CM

## 2020-06-03 DIAGNOSIS — O24112 Pre-existing diabetes mellitus, type 2, in pregnancy, second trimester: Secondary | ICD-10-CM

## 2020-06-03 DIAGNOSIS — O09292 Supervision of pregnancy with other poor reproductive or obstetric history, second trimester: Secondary | ICD-10-CM

## 2020-06-03 DIAGNOSIS — Z3009 Encounter for other general counseling and advice on contraception: Secondary | ICD-10-CM | POA: Diagnosis present

## 2020-06-03 DIAGNOSIS — O99280 Endocrine, nutritional and metabolic diseases complicating pregnancy, unspecified trimester: Secondary | ICD-10-CM | POA: Insufficient documentation

## 2020-06-03 DIAGNOSIS — Z3A27 27 weeks gestation of pregnancy: Secondary | ICD-10-CM

## 2020-06-03 DIAGNOSIS — O10012 Pre-existing essential hypertension complicating pregnancy, second trimester: Secondary | ICD-10-CM

## 2020-06-03 DIAGNOSIS — E669 Obesity, unspecified: Secondary | ICD-10-CM

## 2020-06-03 DIAGNOSIS — O99282 Endocrine, nutritional and metabolic diseases complicating pregnancy, second trimester: Secondary | ICD-10-CM

## 2020-06-03 DIAGNOSIS — Z98891 History of uterine scar from previous surgery: Secondary | ICD-10-CM

## 2020-06-03 DIAGNOSIS — D6861 Antiphospholipid syndrome: Secondary | ICD-10-CM

## 2020-06-03 DIAGNOSIS — O09299 Supervision of pregnancy with other poor reproductive or obstetric history, unspecified trimester: Secondary | ICD-10-CM

## 2020-06-03 DIAGNOSIS — Z362 Encounter for other antenatal screening follow-up: Secondary | ICD-10-CM

## 2020-06-03 DIAGNOSIS — O34219 Maternal care for unspecified type scar from previous cesarean delivery: Secondary | ICD-10-CM

## 2020-06-03 DIAGNOSIS — O09522 Supervision of elderly multigravida, second trimester: Secondary | ICD-10-CM | POA: Diagnosis not present

## 2020-06-03 DIAGNOSIS — Z862 Personal history of diseases of the blood and blood-forming organs and certain disorders involving the immune mechanism: Secondary | ICD-10-CM

## 2020-06-03 LAB — POCT URINALYSIS DIPSTICK
Glucose, UA: NEGATIVE
Protein, UA: NEGATIVE

## 2020-06-03 IMAGING — US US MFM OB FOLLOW-UP
1 series · 13 of 28 positions shown · non-contrast
Comparison: none

[Series 1: us mfm ob follow-up · 40 acquisitions, 13 frames shown]
[im 2/40]
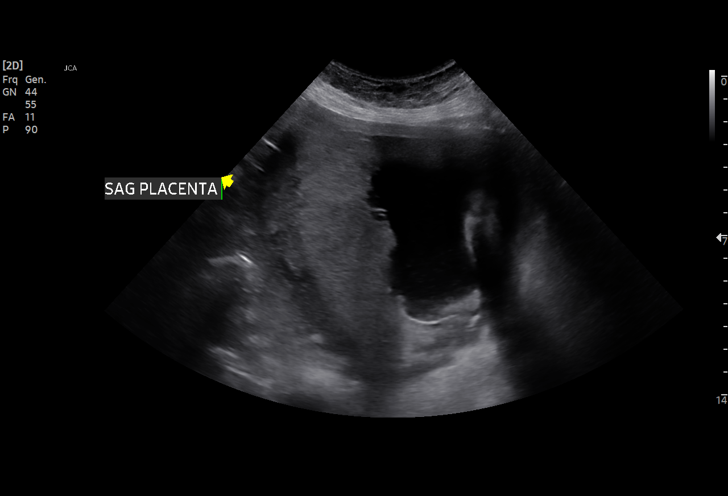
[im 5/40]
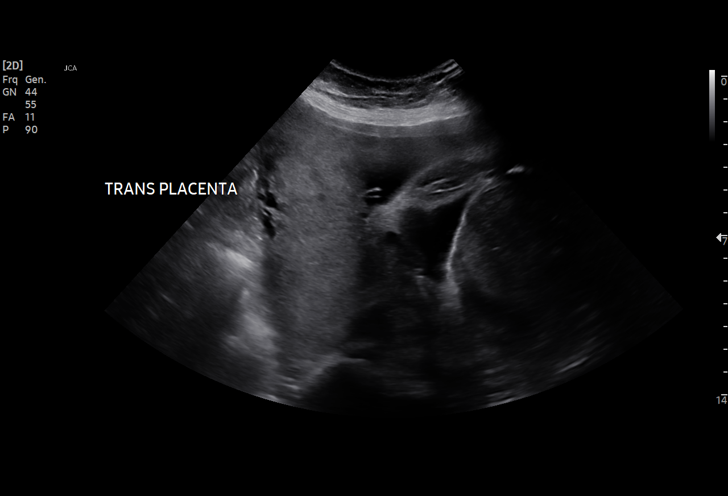
[im 8/40]
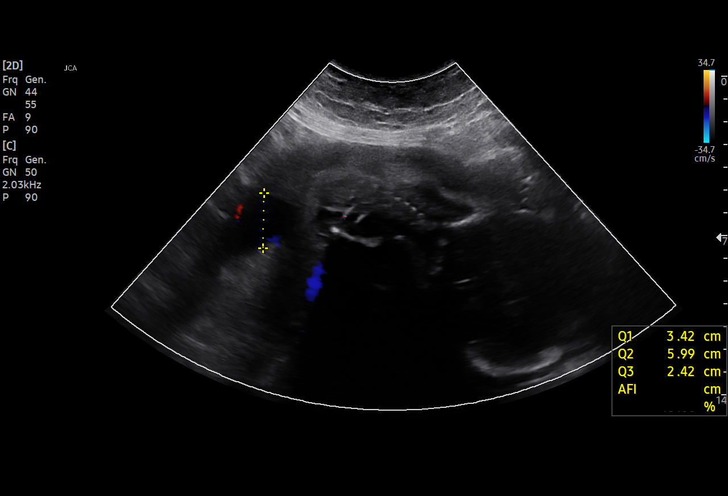
[im 11/40]
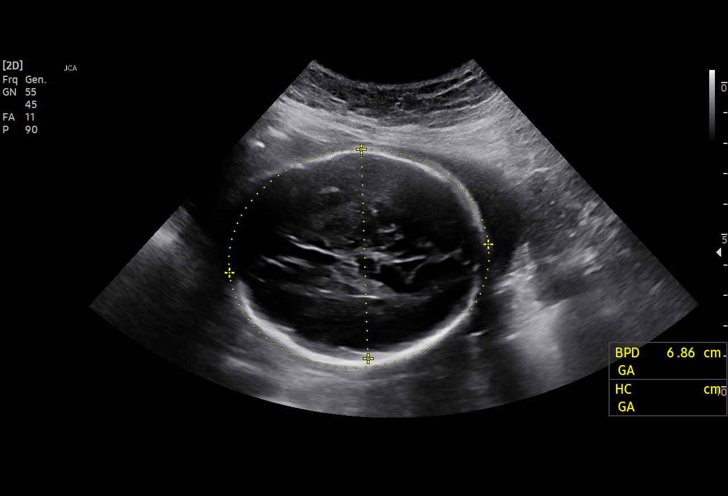
[im 14/40]
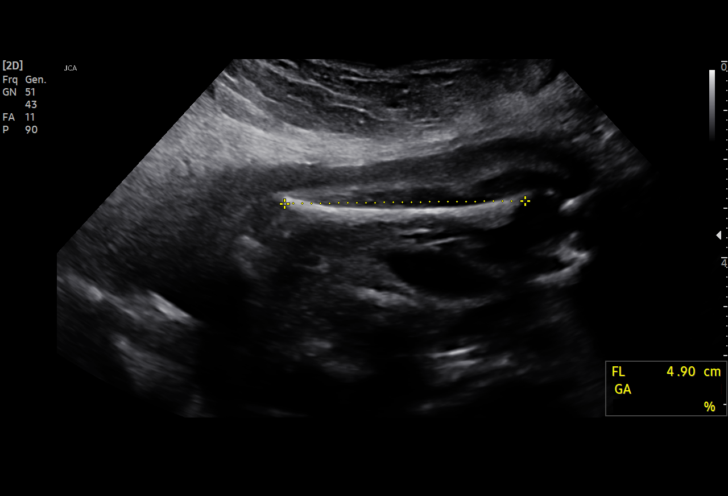
[im 16/40]
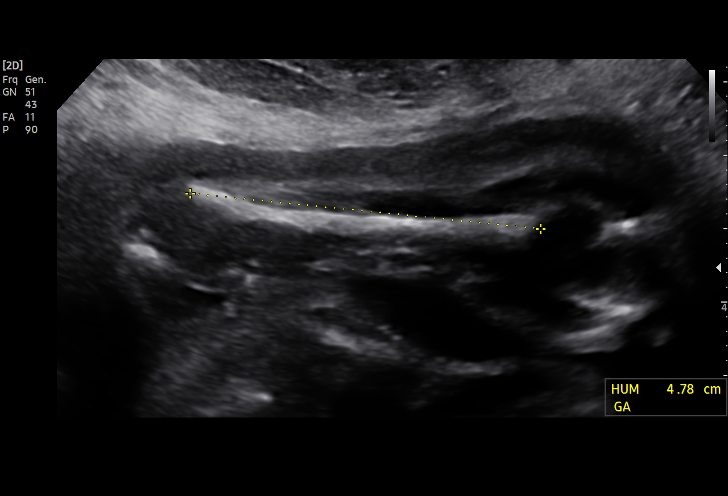
[im 21/40]
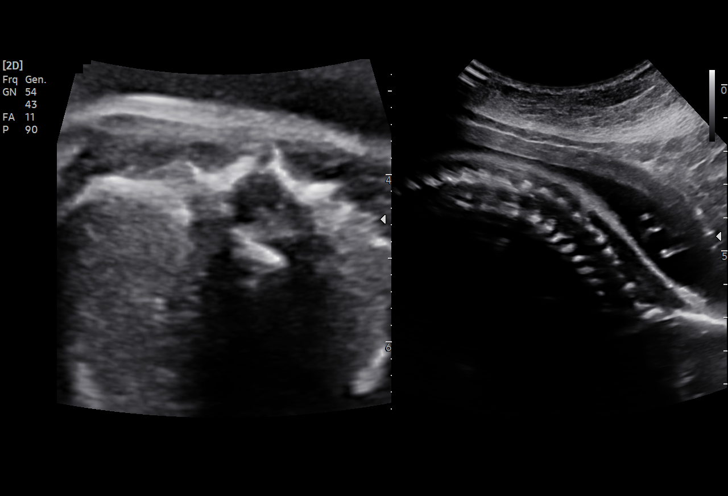
[im 24/40]
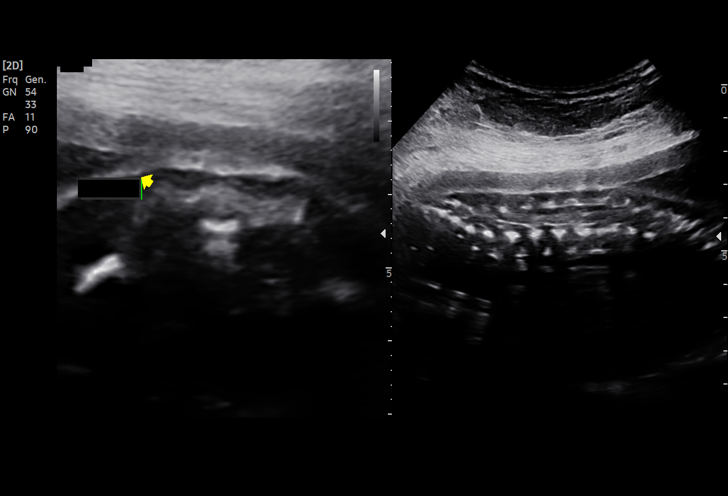
[im 27/40]
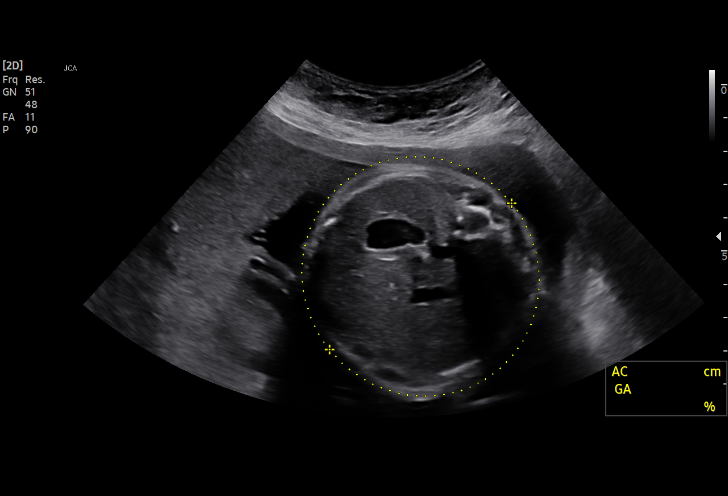
[im 29/40]
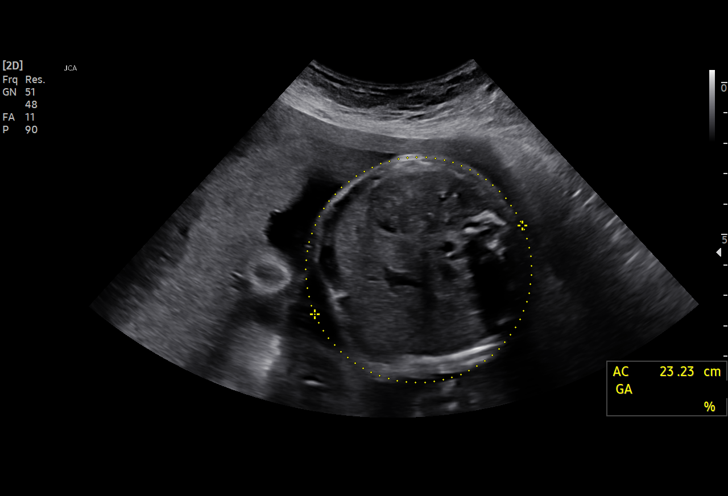
[im 32/40]
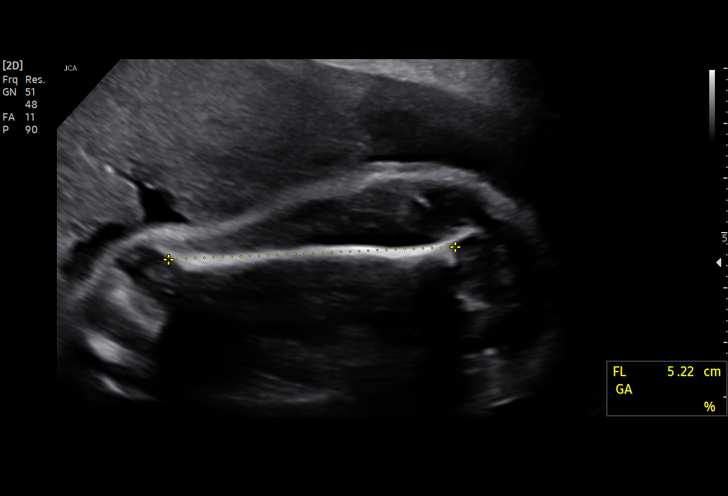
[im 35/40]
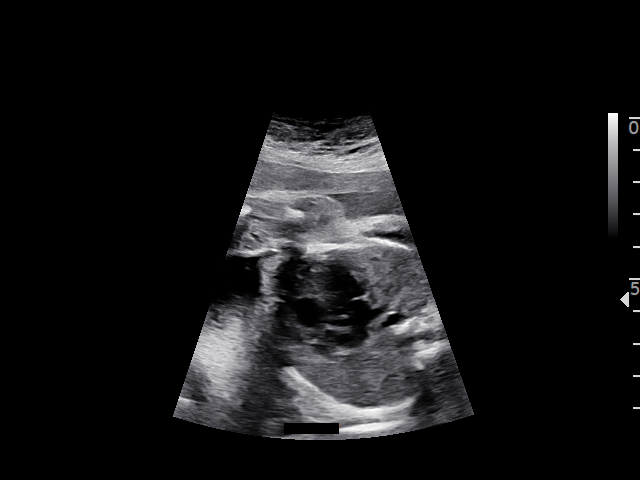
[im 38/40]
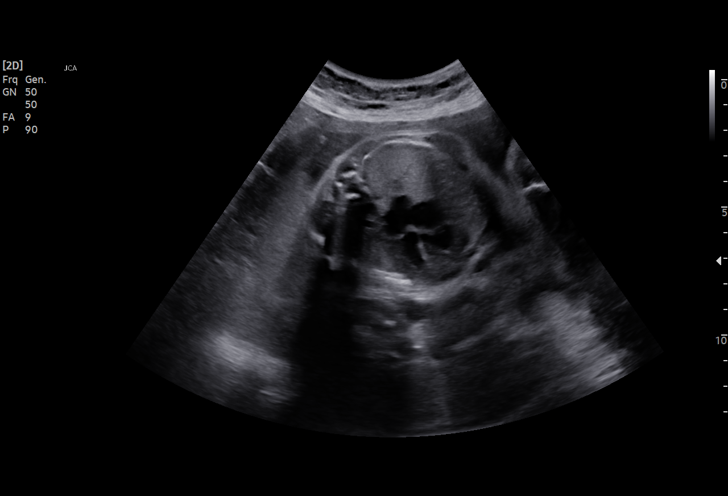

[13 of 28 positions shown; findings below may reference images not displayed]

Indications

 Pre-existing diabetes, type 2, in pregnancy,   [LN]
 second trimester
 Encounter for other antenatal screening        [LN]
 follow-up
 Advanced maternal age multigravida 35+,        [LN]
 second trimester
 Hypertension - Chronic/Pre-existing            [LN]
 Obesity complicating pregnancy, second         [LN]
 trimester (BMI 43)
 Antiphospholipid syndrome complicating         O99.119, [LN]
 pregnancy, antepartum
 Hypothyroid                                    [LN] [LN]
 History of cesarean delivery, currently        [LN]
 pregnant
 Poor obstetric history: Previous               [LN]
 preeclampsia / eclampsia/gestational HTN
 27 weeks gestation of pregnancy
Vital Signs

                                                Height:        5'1"
Fetal Evaluation

 Num Of Fetuses:         1
 Fetal Heart Rate(bpm):  148
 Cardiac Activity:       Observed
 Presentation:           Cephalic
 Placenta:               Posterior
 P. Cord Insertion:      Visualized
 Amniotic Fluid
 AFI FV:      Within normal limits

 AFI Sum(cm)     %Tile       Largest Pocket(cm)
 14.86           51

 RUQ(cm)       RLQ(cm)       LUQ(cm)        LLQ(cm)

Biometry

 BPD:      68.5  mm     G. Age:  27w 4d         58  %    CI:        75.76   %    70 - 86
                                                         FL/HC:      20.3   %    18.6 -
 HC:      249.5  mm     G. Age:  27w 1d         25  %    HC/AC:      1.11        1.05 -
 AC:      225.3  mm     G. Age:  27w 0d         39  %    FL/BPD:     73.9   %    71 - 87
 FL:       50.6  mm     G. Age:  27w 1d         39  %    FL/AC:      22.5   %    20 - 24
 HUM:        49  mm     G. Age:  28w 6d         84  %

 Est. FW:    [LN]  gm      2 lb 4 oz     40  %
OB History

 Blood Type:   A+
 Maternal Racial/Ethnic Group:   White
 Gravidity:    5         Term:   1        Prem:   0        SAB:   3
 TOP:          0       Ectopic:  0        Living: 1
Gestational Age

 LMP:           27w 0d        Date:  [DATE]                 EDD:   [DATE]
 U/S Today:     27w 2d                                        EDD:   [DATE]
 Best:          27w 0d     Det. By:  LMP  ([DATE])          EDD:   [DATE]
Anatomy

 Cranium:               Appears normal         Aortic Arch:            Not well visualized
 Cavum:                 Appears normal         Ductal Arch:            Previously seen
 Ventricles:            Appears normal         Diaphragm:              Appears normal
 Choroid Plexus:        Previously seen        Stomach:                Appears normal, left
                                                                       sided
 Cerebellum:            Previously seen        Abdomen:                Appears normal
 Posterior Fossa:       Previously seen        Abdominal Wall:         Previously seen
 Nuchal Fold:           Not applicable (>20    Cord Vessels:           Previously seen
                        wks GA)
 Face:                  Orbits and profile     Kidneys:                Appear normal
                        previously seen
 Lips:                  Previously seen        Bladder:                Appears normal
 Thoracic:              Appears normal         Spine:                  Limited views
                                                                       appear normal
 Heart:                 Appears normal         Upper Extremities:      Previously seen
                        (4CH, axis, and
                        situs)
 RVOT:                  Previously seen        Lower Extremities:      Previously seen
 LVOT:                  Previously seen

 Other:  Male gender. Technically difficult due to maternal habitus and fetal
         position.
Comments

 This patient was seen for a follow up growth scan due to
 maternal obesity, chronic hypertension, pregestational
 diabetes, and the antiphospholipid antibody syndrome.  She
 remains on prophylactic Lovenox due to the RUI RODRIGUES syndrome.
 She denies any problems since her last exam.
 She was informed that the fetal growth and amniotic fluid
 level appears appropriate for her gestational age.
 A follow up exam was scheduled in 4 weeks.  Due to her
 underlying medical conditions, we will start weekly fetal
 testing at around 32 weeks.

## 2020-06-03 NOTE — Progress Notes (Signed)
   PRENATAL VISIT NOTE  Subjective:  Kelli Miller is a 39 y.o. G5P1031 at [redacted]w[redacted]d being seen today for ongoing prenatal care.  She is currently monitored for the following issues for this high-risk pregnancy and has Advanced maternal age in multigravida; Preexisting hypertension complicating pregnancy, antepartum; PCOS (polycystic ovarian syndrome); GERD (gastroesophageal reflux disease); Psoriasis; Hypothyroidism; Diabetes mellitus (HCC); B12 deficiency; Vitamin D deficiency; Family history of premature CAD; History of antiphospholipid syndrome; Supervision of high risk pregnancy, antepartum; Preexisting diabetes complicating pregnancy, antepartum; Hypothyroid in pregnancy, antepartum; History of severe preeclampsia superimposed on chronic hypertension in prior pregnancy; History of cesarean section for failure to progress; Maternal morbid obesity, antepartum (HCC); Left ankle pain; and Unwanted fertility on their problem list.  Patient reports no complaints.  Contractions: Not present. Vag. Bleeding: None.  Movement: Present. Denies leaking of fluid.   The following portions of the patient's history were reviewed and updated as appropriate: allergies, current medications, past family history, past medical history, past social history, past surgical history and problem list.   Objective:   Vitals:   06/03/20 1100  BP: 128/66  Pulse: (!) 102  Weight: 250 lb (113.4 kg)    Fetal Status: Fetal Heart Rate (bpm): 153   Movement: Present     General:  Alert, oriented and cooperative. Patient is in no acute distress.  Skin: Skin is warm and dry. No rash noted.   Cardiovascular: Normal heart rate noted  Respiratory: Normal respiratory effort, no problems with respiration noted  Abdomen: Soft, gravid, appropriate for gestational age.  Pain/Pressure: Absent     Pelvic: Cervical exam deferred        Extremities: Normal range of motion.  Edema: Trace  Mental Status: Normal mood and affect. Normal  behavior. Normal judgment and thought content.   Assessment and Plan:  Pregnancy: G5P1031 at [redacted]w[redacted]d  1. [redacted] weeks gestation of pregnancy - POCT Urinalysis Dipstick  2. Preexisting diabetes complicating pregnancy, antepartum NPH at night 66 units, am 54 units novolog 9 in am, 11 at night, 5 at lunch FG mostly elevated > 95 despite two weeks of being on increased nightly NPH at 66,  Will increase to 70 units NPH nightly Post prandial CBGs mostly within range  3. Hypothyroid in pregnancy, antepartum TSH today  4. Preexisting hypertension complicating pregnancy, antepartum On norvasc 10 mg, Started on labetalol 200 mg BID last visit BP normotensive today  5. History of cesarean section for failure to progress For RCS, scheduling request sent today  6. Maternal morbid obesity, antepartum (HCC)  7. History of severe preeclampsia superimposed on chronic hypertension in prior pregnancy  8. Supervision of high risk pregnancy, antepartum  9. History of antiphospholipid syndrome Cont lovenox  10. Unwanted fertility For BTL   Preterm labor symptoms and general obstetric precautions including but not limited to vaginal bleeding, contractions, leaking of fluid and fetal movement were reviewed in detail with the patient. Please refer to After Visit Summary for other counseling recommendations.   No follow-ups on file.  Future Appointments  Date Time Provider Department Center  06/03/2020  1:45 PM [facility]URSE [facility]  06/03/2020  2:00 PM [facility] US1 [facility]  06/17/2020 10:45 AM Reva Bores, MD [facility]    Conan Bowens, MD

## 2020-06-03 NOTE — Progress Notes (Signed)
Pt states she lost some mucus plug

## 2020-06-03 NOTE — Progress Notes (Signed)
Neg Gluc and Protein

## 2020-06-04 ENCOUNTER — Other Ambulatory Visit: Payer: Self-pay | Admitting: *Deleted

## 2020-06-04 DIAGNOSIS — O10919 Unspecified pre-existing hypertension complicating pregnancy, unspecified trimester: Secondary | ICD-10-CM

## 2020-06-08 LAB — COMPREHENSIVE METABOLIC PANEL
AG Ratio: 1.3 (calc) (ref 1.0–2.5)
ALT: 8 U/L (ref 6–29)
AST: 9 U/L — ABNORMAL LOW (ref 10–30)
Albumin: 3.5 g/dL — ABNORMAL LOW (ref 3.6–5.1)
Alkaline phosphatase (APISO): 46 U/L (ref 31–125)
BUN/Creatinine Ratio: 11 (calc) (ref 6–22)
BUN: 5 mg/dL — ABNORMAL LOW (ref 7–25)
CO2: 23 mmol/L (ref 20–32)
Calcium: 8.7 mg/dL (ref 8.6–10.2)
Chloride: 108 mmol/L (ref 98–110)
Creat: 0.44 mg/dL — ABNORMAL LOW (ref 0.50–1.10)
Globulin: 2.7 g/dL (calc) (ref 1.9–3.7)
Glucose, Bld: 85 mg/dL (ref 65–99)
Potassium: 3.9 mmol/L (ref 3.5–5.3)
Sodium: 138 mmol/L (ref 135–146)
Total Bilirubin: 0.3 mg/dL (ref 0.2–1.2)
Total Protein: 6.2 g/dL (ref 6.1–8.1)

## 2020-06-08 LAB — VITAMIN D 1,25 DIHYDROXY
Vitamin D 1, 25 (OH)2 Total: 160 pg/mL — ABNORMAL HIGH (ref 18–72)
Vitamin D2 1, 25 (OH)2: <8 pg/mL
Vitamin D3 1, 25 (OH)2: 160 pg/mL

## 2020-06-08 LAB — CBC
HCT: 33.9 % — ABNORMAL LOW (ref 35.0–45.0)
Hemoglobin: 11 g/dL — ABNORMAL LOW (ref 11.7–15.5)
MCH: 31.3 pg (ref 27.0–33.0)
MCHC: 32.4 g/dL (ref 32.0–36.0)
MCV: 96.3 fL (ref 80.0–100.0)
MPV: 9.9 fL (ref 7.5–12.5)
Platelets: 244 10*3/uL (ref 140–400)
RBC: 3.52 10*6/uL — ABNORMAL LOW (ref 3.80–5.10)
RDW: 13.9 % (ref 11.0–15.0)
WBC: 12 10*3/uL — ABNORMAL HIGH (ref 3.8–10.8)

## 2020-06-08 LAB — HIV ANTIBODY (ROUTINE TESTING W REFLEX): HIV 1&2 Ab, 4th Generation: NONREACTIVE

## 2020-06-08 LAB — RPR: RPR Ser Ql: NONREACTIVE

## 2020-06-08 LAB — TSH: TSH: 1.59 mIU/L

## 2020-06-17 ENCOUNTER — Ambulatory Visit (INDEPENDENT_AMBULATORY_CARE_PROVIDER_SITE_OTHER): Payer: PRIVATE HEALTH INSURANCE | Admitting: Family Medicine

## 2020-06-17 ENCOUNTER — Other Ambulatory Visit: Payer: Self-pay

## 2020-06-17 VITALS — BP 120/71 | HR 103 | Wt 253.0 lb

## 2020-06-17 DIAGNOSIS — O24319 Unspecified pre-existing diabetes mellitus in pregnancy, unspecified trimester: Secondary | ICD-10-CM

## 2020-06-17 DIAGNOSIS — Z3A29 29 weeks gestation of pregnancy: Secondary | ICD-10-CM

## 2020-06-17 DIAGNOSIS — E039 Hypothyroidism, unspecified: Secondary | ICD-10-CM

## 2020-06-17 DIAGNOSIS — Z862 Personal history of diseases of the blood and blood-forming organs and certain disorders involving the immune mechanism: Secondary | ICD-10-CM

## 2020-06-17 DIAGNOSIS — O10919 Unspecified pre-existing hypertension complicating pregnancy, unspecified trimester: Secondary | ICD-10-CM

## 2020-06-17 DIAGNOSIS — O99210 Obesity complicating pregnancy, unspecified trimester: Secondary | ICD-10-CM

## 2020-06-17 DIAGNOSIS — E538 Deficiency of other specified B group vitamins: Secondary | ICD-10-CM

## 2020-06-17 DIAGNOSIS — O99280 Endocrine, nutritional and metabolic diseases complicating pregnancy, unspecified trimester: Secondary | ICD-10-CM

## 2020-06-17 DIAGNOSIS — L409 Psoriasis, unspecified: Secondary | ICD-10-CM

## 2020-06-17 DIAGNOSIS — Z98891 History of uterine scar from previous surgery: Secondary | ICD-10-CM

## 2020-06-17 DIAGNOSIS — O0990 Supervision of high risk pregnancy, unspecified, unspecified trimester: Secondary | ICD-10-CM

## 2020-06-17 DIAGNOSIS — O09523 Supervision of elderly multigravida, third trimester: Secondary | ICD-10-CM

## 2020-06-17 DIAGNOSIS — Z3009 Encounter for other general counseling and advice on contraception: Secondary | ICD-10-CM

## 2020-06-17 MED ORDER — LABETALOL HCL 200 MG PO TABS: 200.0000 mg | ORAL_TABLET | Freq: Two times a day (BID) | ORAL | 3 refills | Status: DC

## 2020-06-17 NOTE — Progress Notes (Signed)
PRENATAL VISIT NOTE  Subjective:  Kelli Miller is a 39 y.o. G5P1031 at [redacted]w[redacted]d being seen today for ongoing prenatal care.  She is currently monitored for the following issues for this high-risk pregnancy and has Advanced maternal age in multigravida; Preexisting hypertension complicating pregnancy, antepartum; PCOS (polycystic ovarian syndrome); GERD (gastroesophageal reflux disease); Psoriasis; Hypothyroidism; Diabetes mellitus (HCC); B12 deficiency; Vitamin D deficiency; Family history of premature CAD; History of antiphospholipid syndrome; Supervision of high risk pregnancy, antepartum; Preexisting diabetes complicating pregnancy, antepartum; Hypothyroid in pregnancy, antepartum; History of severe preeclampsia superimposed on chronic hypertension in prior pregnancy; History of cesarean section for failure to progress; Maternal morbid obesity, antepartum (HCC); Left ankle pain; and Unwanted fertility on their problem list.  Patient reports no complaints.  Contractions: Not present. Vag. Bleeding: None.  Movement: Present. Denies leaking of fluid.   The following portions of the patient's history were reviewed and updated as appropriate: allergies, current medications, past family history, past medical history, past social history, past surgical history and problem list.   Objective:   Vitals:   06/17/20 1047  BP: 120/71  Pulse: (!) 103  Weight: 253 lb (114.8 kg)    Fetal Status: Fetal Heart Rate (bpm): 148   Movement: Present     General:  Alert, oriented and cooperative. Patient is in no acute distress.  Skin: Skin is warm and dry. No rash noted.   Cardiovascular: Normal heart rate noted  Respiratory: Normal respiratory effort, no problems with respiration noted  Abdomen: Soft, gravid, appropriate for gestational age.  Pain/Pressure: Absent     Pelvic: Cervical exam deferred        Extremities: Normal range of motion.  Edema: Trace  Mental Status: Normal mood and affect. Normal  behavior. Normal judgment and thought content.   Assessment and Plan:  Pregnancy: G5P1031 at [redacted]w[redacted]d 1. [redacted] weeks gestation of pregnancy The patient was counseled on the potential benefits and lack of known risks of COVID vaccination, during pregnancy and breastfeeding, on today's visit. The patient's questions and concerns were addressed today, including risk of clotting. The patient is still unsure of her decision for vaccination. The patient is aware that if she chooses not to get an employee mandated vaccination we will provide documentation for her employers in the form of a letter unless a specific exemption form is submitted to the provider.   2. Preexisting hypertension complicating pregnancy, antepartum BP is well controlled. Continue labetalol and ASA Baby is normally grown - labetalol (NORMODYNE) 200 MG tablet; Take 1 tablet (200 mg total) by mouth 2 (two) times daily.  Dispense: 180 tablet; Refill: 3  3. Hypothyroid in pregnancy, antepartum Normal TSH  4. Preexisting diabetes complicating pregnancy, antepartum  Media Information    Media Information   Fasting CBGs are elevated, ? Low at 2 am, unable to check. Will attempt to back off to 68 u q hs. CBGs are 1 hour pp and are WNL.  5. Multigravida of advanced maternal age in third trimester Declined genetics  6. History of cesarean section for failure to progress For RCS with BTL--scheduled.  7. Supervision of high risk pregnancy, antepartum   8. Maternal morbid obesity, antepartum (HCC)   9. Unwanted fertility Discussed and desires bilateral salpingectomy  10. History of antiphospholipid syndrome On lovenox  11. B12 deficiency Check level - B12 and Folate Panel  12. Psoriasis Continue Stelara  Preterm labor symptoms and general obstetric precautions including but not limited to vaginal bleeding, contractions, leaking of fluid and  fetal movement were reviewed in detail with the patient. Please refer to After  Visit Summary for other counseling recommendations.   Return in 2 weeks (on 07/01/2020) for virtual.  Future Appointments  Date Time Provider Department Center  07/02/2020  3:15 PM [facility] NURSE [facility]  07/02/2020  3:30 PM [facility] US3 [facility]  07/04/2020 10:15 AM Conan Bowens, MD [facility]  07/10/2020  3:00 PM [facility] NURSE [facility]  07/10/2020  3:15 PM [facility] US3 [facility]    Reva Bores, MD

## 2020-06-17 NOTE — Patient Instructions (Signed)

## 2020-06-18 ENCOUNTER — Encounter: Payer: Self-pay | Admitting: *Deleted

## 2020-06-18 DIAGNOSIS — E538 Deficiency of other specified B group vitamins: Secondary | ICD-10-CM

## 2020-06-18 LAB — B12 AND FOLATE PANEL
Folate: 16.1 ng/mL
Vitamin B-12: 217 pg/mL (ref 200–1100)

## 2020-06-18 MED ORDER — CYANOCOBALAMIN 1000 MCG/ML IJ SOLN: 1000.0000 ug | INTRAMUSCULAR | 5 refills | Status: AC

## 2020-07-02 ENCOUNTER — Ambulatory Visit: Payer: PRIVATE HEALTH INSURANCE | Admitting: *Deleted

## 2020-07-02 ENCOUNTER — Other Ambulatory Visit: Payer: Self-pay

## 2020-07-02 ENCOUNTER — Ambulatory Visit: Payer: PRIVATE HEALTH INSURANCE | Attending: Obstetrics and Gynecology

## 2020-07-02 ENCOUNTER — Encounter: Payer: Self-pay | Admitting: *Deleted

## 2020-07-02 DIAGNOSIS — Z3009 Encounter for other general counseling and advice on contraception: Secondary | ICD-10-CM

## 2020-07-02 DIAGNOSIS — E669 Obesity, unspecified: Secondary | ICD-10-CM

## 2020-07-02 DIAGNOSIS — E039 Hypothyroidism, unspecified: Secondary | ICD-10-CM

## 2020-07-02 DIAGNOSIS — O09523 Supervision of elderly multigravida, third trimester: Secondary | ICD-10-CM

## 2020-07-02 DIAGNOSIS — O24319 Unspecified pre-existing diabetes mellitus in pregnancy, unspecified trimester: Secondary | ICD-10-CM | POA: Insufficient documentation

## 2020-07-02 DIAGNOSIS — O24113 Pre-existing diabetes mellitus, type 2, in pregnancy, third trimester: Secondary | ICD-10-CM | POA: Diagnosis not present

## 2020-07-02 DIAGNOSIS — O99280 Endocrine, nutritional and metabolic diseases complicating pregnancy, unspecified trimester: Secondary | ICD-10-CM | POA: Insufficient documentation

## 2020-07-02 DIAGNOSIS — O0990 Supervision of high risk pregnancy, unspecified, unspecified trimester: Secondary | ICD-10-CM | POA: Insufficient documentation

## 2020-07-02 DIAGNOSIS — I10 Essential (primary) hypertension: Secondary | ICD-10-CM

## 2020-07-02 DIAGNOSIS — O34219 Maternal care for unspecified type scar from previous cesarean delivery: Secondary | ICD-10-CM

## 2020-07-02 DIAGNOSIS — Z3A31 31 weeks gestation of pregnancy: Secondary | ICD-10-CM

## 2020-07-02 DIAGNOSIS — O99282 Endocrine, nutritional and metabolic diseases complicating pregnancy, second trimester: Secondary | ICD-10-CM

## 2020-07-02 DIAGNOSIS — Z362 Encounter for other antenatal screening follow-up: Secondary | ICD-10-CM

## 2020-07-02 DIAGNOSIS — E119 Type 2 diabetes mellitus without complications: Secondary | ICD-10-CM

## 2020-07-02 DIAGNOSIS — O99113 Other diseases of the blood and blood-forming organs and certain disorders involving the immune mechanism complicating pregnancy, third trimester: Secondary | ICD-10-CM

## 2020-07-02 DIAGNOSIS — D6861 Antiphospholipid syndrome: Secondary | ICD-10-CM

## 2020-07-02 DIAGNOSIS — O99213 Obesity complicating pregnancy, third trimester: Secondary | ICD-10-CM

## 2020-07-02 DIAGNOSIS — O10919 Unspecified pre-existing hypertension complicating pregnancy, unspecified trimester: Secondary | ICD-10-CM | POA: Insufficient documentation

## 2020-07-02 DIAGNOSIS — O09293 Supervision of pregnancy with other poor reproductive or obstetric history, third trimester: Secondary | ICD-10-CM

## 2020-07-02 IMAGING — US US MFM OB FOLLOW-UP
1 series · 13 of 25 positions shown · non-contrast
Comparison: none

[Series 1: us mfm ob follow-up · 25 acquisitions, 13 frames shown]
[im 1/25]
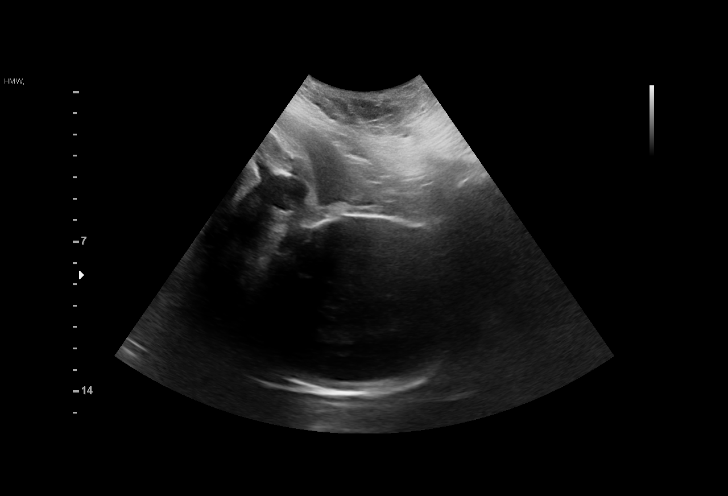
[im 3/25]
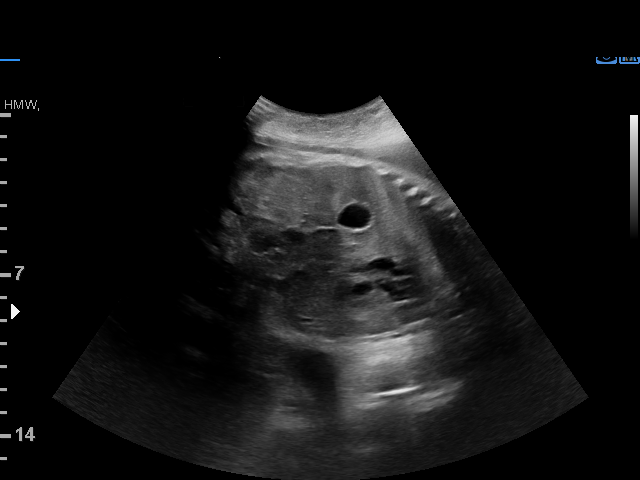
[im 5/25]
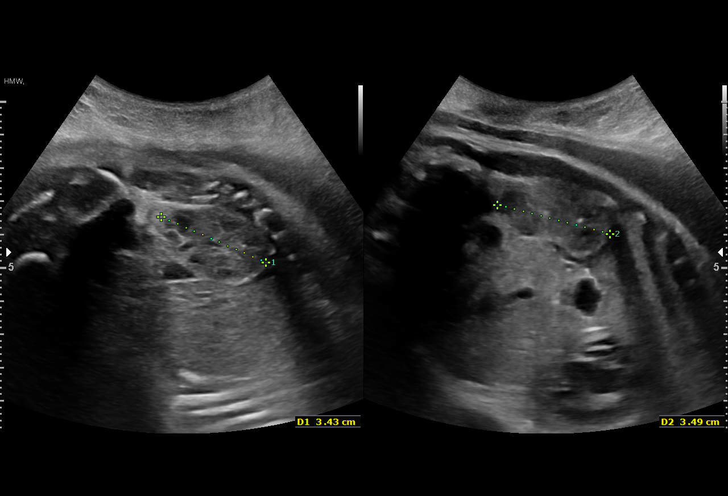
[im 7/25]
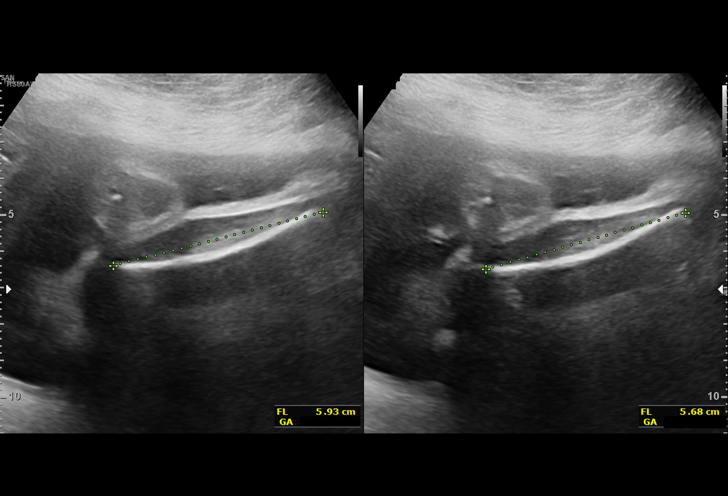
[im 9/25]
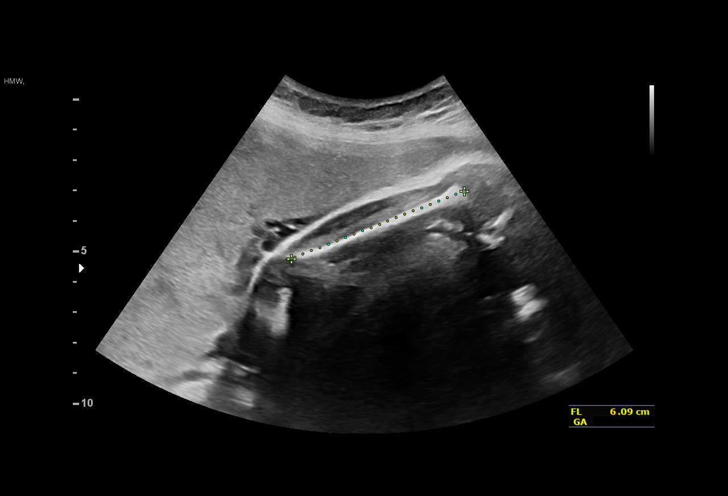
[im 11/25]
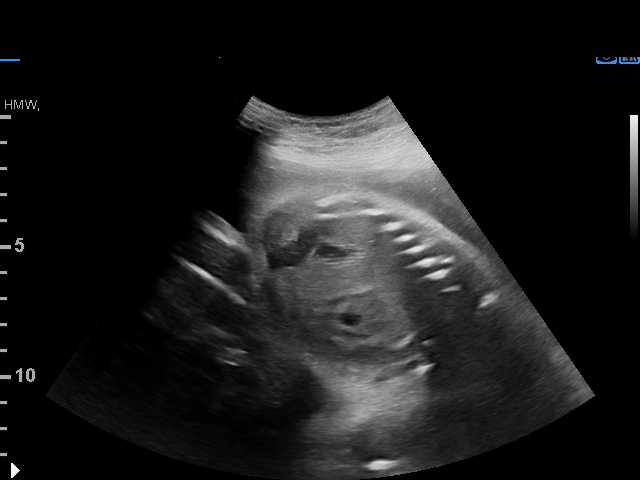
[im 13/25]
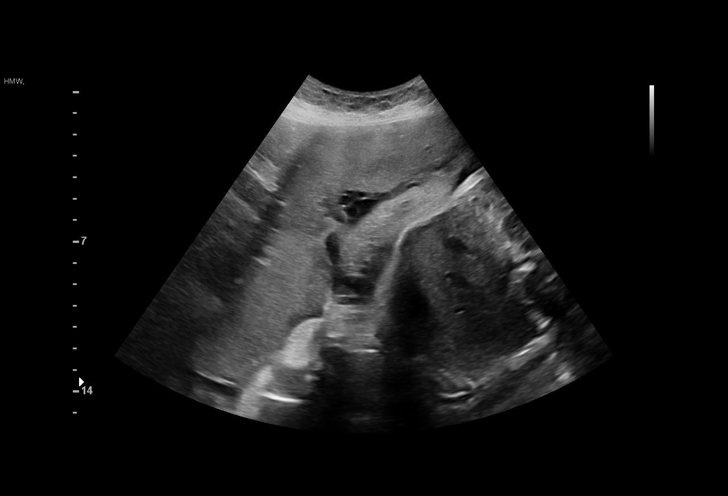
[im 15/25]
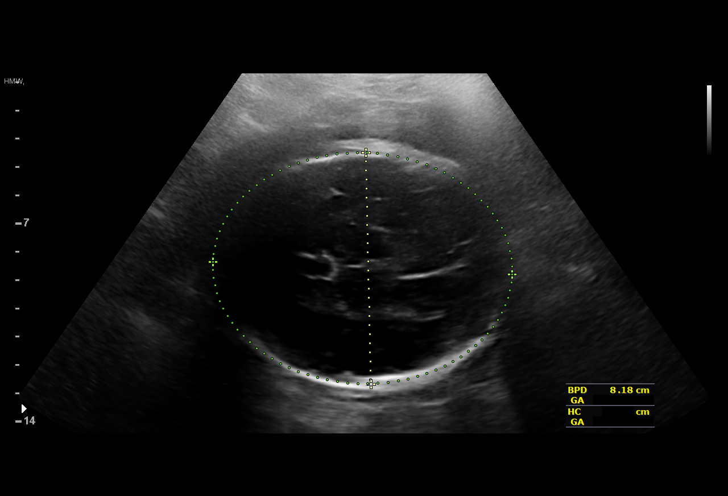
[im 17/25]
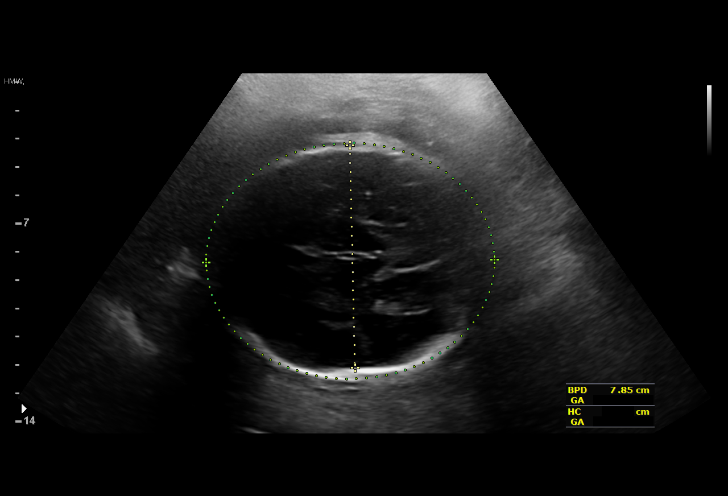
[im 19/25]
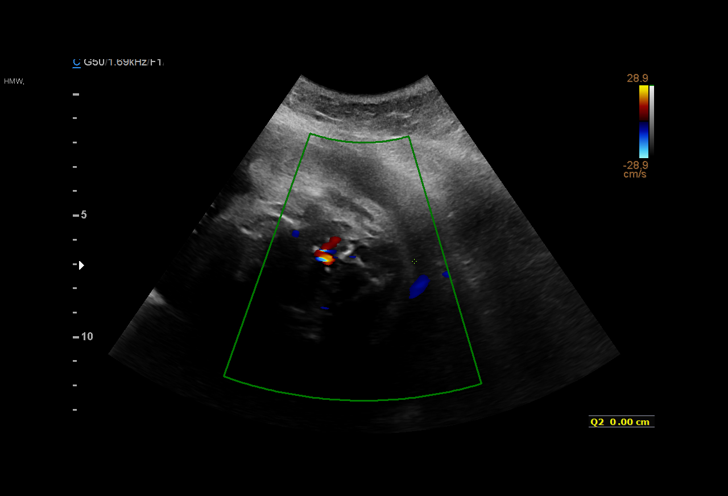
[im 21/25]
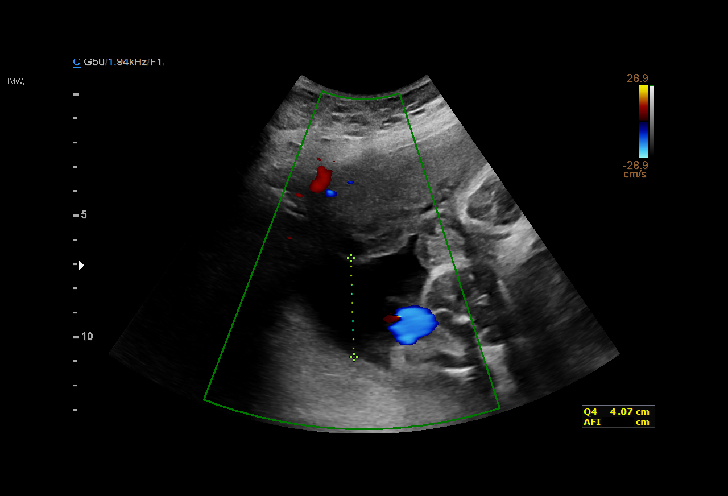
[im 23/25]
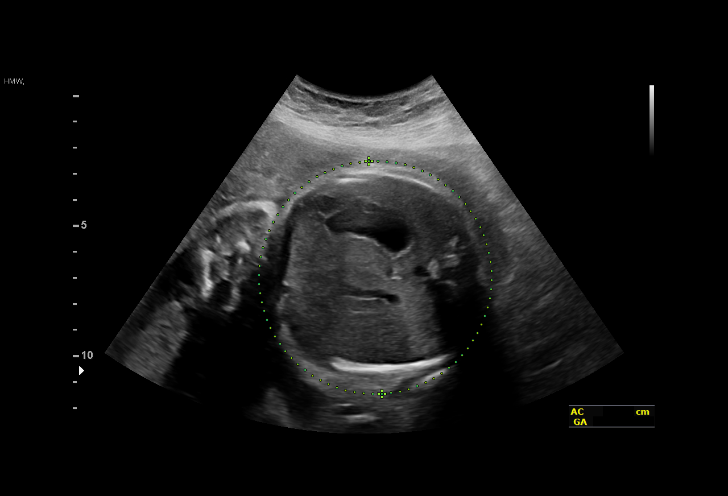
[im 25/25]
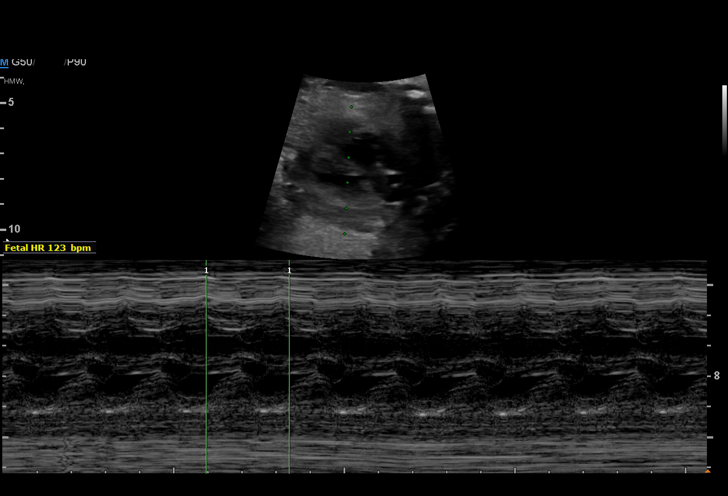

[13 of 25 positions shown; findings below may reference images not displayed]

Indications

 31 weeks gestation of pregnancy
 Pre-existing diabetes, type 2, in pregnancy,   [TY]
 second trimester
 Advanced maternal age multigravida 35+,        [TY]
 second trimester
 Hypertension - Chronic/Pre-existing            [TY]
 Obesity complicating pregnancy, second         [TY]
 trimester (BMI 43)
 Antiphospholipid syndrome complicating         O99.119, [TY]
 pregnancy, antepartum
 Hypothyroid                                    [TY] [TY]
 History of cesarean delivery, currently        [TY]
 pregnant
 Poor obstetric history: Previous               [TY]
 preeclampsia / eclampsia/gestational HTN
 Encounter for other antenatal screening        [TY]
 follow-up
Vital Signs

                                                Height:        5'1"
Fetal Evaluation

 Num Of Fetuses:         1
 Fetal Heart Rate(bpm):  123
 Cardiac Activity:       Observed
 Presentation:           Cephalic
 Placenta:               Posterior
 P. Cord Insertion:      Previously Visualized
 Amniotic Fluid
 AFI FV:      Within normal limits

 AFI Sum(cm)     %Tile       Largest Pocket(cm)
 11.33           25

 RUQ(cm)       RLQ(cm)       LUQ(cm)        LLQ(cm)
 5.46          4.07          0
Biometry

 BPD:      79.3  mm     G. Age:  31w 6d         61  %    CI:        73.06   %    70 - 86
                                                         FL/HC:      20.2   %    19.3 -
 HC:      294.9  mm     G. Age:  32w 4d         53  %    HC/AC:      1.05        0.96 -
 AC:      280.4  mm     G. Age:  32w 1d         74  %    FL/BPD:     75.0   %    71 - 87
 FL:       59.5  mm     G. Age:  31w 0d         32  %    FL/AC:      21.2   %    20 - 24

 Est. FW:    [TY]  gm      4 lb 1 oz     60  %
OB History

 Blood Type:   A+
 Maternal Racial/Ethnic Group:   White
 Gravidity:    5         Term:   1        Prem:   0        SAB:   3
 TOP:          0       Ectopic:  0        Living: 1
Gestational Age

 LMP:           31w 1d        Date:  [DATE]                 EDD:   [DATE]
 U/S Today:     31w 6d                                        EDD:   [DATE]
 Best:          31w 1d     Det. By:  LMP  ([DATE])          EDD:   [DATE]
Anatomy

 Cranium:               Appears normal         Aortic Arch:            Not well visualized
 Cavum:                 Previously seen        Ductal Arch:            Previously seen
 Ventricles:            Appears normal         Diaphragm:              Previously seen
 Choroid Plexus:        Previously seen        Stomach:                Appears normal, left
                                                                       sided
 Cerebellum:            Previously seen        Abdomen:                Previously seen
 Posterior Fossa:       Previously seen        Abdominal Wall:         Previously seen
 Nuchal Fold:           Not applicable (>20    Cord Vessels:           Previously seen
                        wks GA)
 Face:                  Orbits and profile     Kidneys:                Appear normal
                        previously seen
 Lips:                  Previously seen        Bladder:                Appears normal
 Thoracic:              Appears normal         Spine:                  Limited views
                                                                       previously seen
 Heart:                 Previously seen        Upper Extremities:      Previously seen
 RVOT:                  Previously seen        Lower Extremities:      Previously seen
 LVOT:                  Previously seen

 Other:  Male gender. Technically difficult due to maternal habitus and fetal
         position.
Cervix Uterus Adnexa
 Cervix
 Not visualized (advanced GA >[TY])

 Uterus
 No abnormality visualized.

 Right Ovary
 No adnexal mass visualized.

 Left Ovary
 No adnexal mass visualized.

 Cul De Sac
 No free fluid seen.

 Adnexa
 No abnormality visualized.
Impression

 Follow up growth for known chronic hypertension, T2DM,
 APLAS, and elevated BMI
 Normal interval growth with measurements consistent with
 dates
 There is good fetal movement and amniotic fluid volume

 Blood pressures have been normal
Recommendations

 Follow up growth in 4 weeks
 Initiate weekly testing in 1 week.

## 2020-07-03 ENCOUNTER — Other Ambulatory Visit: Payer: Self-pay | Admitting: Obstetrics and Gynecology

## 2020-07-03 ENCOUNTER — Other Ambulatory Visit: Payer: Self-pay | Admitting: *Deleted

## 2020-07-03 DIAGNOSIS — O10919 Unspecified pre-existing hypertension complicating pregnancy, unspecified trimester: Secondary | ICD-10-CM

## 2020-07-04 ENCOUNTER — Telehealth (INDEPENDENT_AMBULATORY_CARE_PROVIDER_SITE_OTHER): Payer: PRIVATE HEALTH INSURANCE | Admitting: Obstetrics and Gynecology

## 2020-07-04 ENCOUNTER — Encounter: Payer: Self-pay | Admitting: Obstetrics and Gynecology

## 2020-07-04 VITALS — BP 130/74 | HR 102 | Wt 250.0 lb

## 2020-07-04 DIAGNOSIS — Z862 Personal history of diseases of the blood and blood-forming organs and certain disorders involving the immune mechanism: Secondary | ICD-10-CM

## 2020-07-04 DIAGNOSIS — O0990 Supervision of high risk pregnancy, unspecified, unspecified trimester: Secondary | ICD-10-CM

## 2020-07-04 DIAGNOSIS — O99213 Obesity complicating pregnancy, third trimester: Secondary | ICD-10-CM

## 2020-07-04 DIAGNOSIS — O24313 Unspecified pre-existing diabetes mellitus in pregnancy, third trimester: Secondary | ICD-10-CM

## 2020-07-04 DIAGNOSIS — Z98891 History of uterine scar from previous surgery: Secondary | ICD-10-CM

## 2020-07-04 DIAGNOSIS — O10913 Unspecified pre-existing hypertension complicating pregnancy, third trimester: Secondary | ICD-10-CM

## 2020-07-04 DIAGNOSIS — Z3A31 31 weeks gestation of pregnancy: Secondary | ICD-10-CM

## 2020-07-04 DIAGNOSIS — O09299 Supervision of pregnancy with other poor reproductive or obstetric history, unspecified trimester: Secondary | ICD-10-CM

## 2020-07-04 DIAGNOSIS — O24319 Unspecified pre-existing diabetes mellitus in pregnancy, unspecified trimester: Secondary | ICD-10-CM

## 2020-07-04 DIAGNOSIS — O10919 Unspecified pre-existing hypertension complicating pregnancy, unspecified trimester: Secondary | ICD-10-CM

## 2020-07-04 DIAGNOSIS — Z3009 Encounter for other general counseling and advice on contraception: Secondary | ICD-10-CM

## 2020-07-04 DIAGNOSIS — O99210 Obesity complicating pregnancy, unspecified trimester: Secondary | ICD-10-CM

## 2020-07-04 DIAGNOSIS — O99280 Endocrine, nutritional and metabolic diseases complicating pregnancy, unspecified trimester: Secondary | ICD-10-CM

## 2020-07-04 DIAGNOSIS — O09293 Supervision of pregnancy with other poor reproductive or obstetric history, third trimester: Secondary | ICD-10-CM

## 2020-07-04 DIAGNOSIS — O99283 Endocrine, nutritional and metabolic diseases complicating pregnancy, third trimester: Secondary | ICD-10-CM

## 2020-07-04 DIAGNOSIS — E119 Type 2 diabetes mellitus without complications: Secondary | ICD-10-CM

## 2020-07-04 DIAGNOSIS — E039 Hypothyroidism, unspecified: Secondary | ICD-10-CM

## 2020-07-04 NOTE — Progress Notes (Signed)
OBSTETRICS PRENATAL VIRTUAL VISIT ENCOUNTER NOTE  Provider location: [facility] for [facility] [place]   I connected with Kelli Miller on 07/04/20 at 10:15 AM EDT by MyChart Video Encounter at home and verified that I am speaking with the correct person using two identifiers.   I discussed the limitations, risks, security and privacy concerns of performing an evaluation and management service virtually and the availability of in person appointments. I also discussed with the patient that there may be a patient responsible charge related to this service. The patient expressed understanding and agreed to proceed. Subjective:  Kelli Miller is a 39 y.o. [phone] at [redacted]w[redacted]d being seen today for ongoing prenatal care.  She is currently monitored for the following issues for this high-risk pregnancy and has Advanced maternal age in multigravida; Preexisting hypertension complicating pregnancy, antepartum; PCOS (polycystic ovarian syndrome); GERD (gastroesophageal reflux disease); Psoriasis; Hypothyroidism; Diabetes mellitus (HCC); B12 deficiency; Vitamin D deficiency; Family history of premature CAD; History of antiphospholipid syndrome; Supervision of high risk pregnancy, antepartum; Preexisting diabetes complicating pregnancy, antepartum; Hypothyroid in pregnancy, antepartum; History of severe preeclampsia superimposed on chronic hypertension in prior pregnancy; History of cesarean section for failure to progress; Maternal morbid obesity, antepartum (HCC); Left ankle pain; and Unwanted fertility on their problem list.  Patient reports no complaints.  Contractions: Irregular. Vag. Bleeding: None.  Movement: Present. Denies any leaking of fluid.   The following portions of the patient's history were reviewed and updated as appropriate: allergies, current medications, past family history, past medical history, past social history, past surgical history and problem list.   Objective:    Vitals:   07/04/20 1036  BP: 130/74  Pulse: (!) 102  Weight: 250 lb (113.4 kg)    Fetal Status:     Movement: Present     General:  Alert, oriented and cooperative. Patient is in no acute distress.  Respiratory: Normal respiratory effort, no problems with respiration noted  Mental Status: Normal mood and affect. Normal behavior. Normal judgment and thought content.  Rest of physical exam deferred due to type of encounter  Imaging:   Assessment and Plan:  Pregnancy: G5P1031 at [redacted]w[redacted]d  1. [redacted] weeks gestation of pregnancy  2. Unwanted fertility For BTL  3. Preexisting diabetes complicating pregnancy, antepartum Saw endocrine, increased her to 84 units NPH QHS for elevated fasting, improved today but still not within range - post prandials are good - has logs uploaded into media Cont metformin Start weekly BPP in 1 weeks  4. Hypothyroid in pregnancy, antepartum Cont euthyrox 50 mcg daily  5. Supervision of high risk pregnancy, antepartum Reviewed kick counts, reasons to present to MAU  6. Preexisting hypertension complicating pregnancy, antepartum BP normotensive today Cont labetalol 200 mg BID and norvasc 10 mg daily  7. History of cesarean section for failure to progress For RCS+BTL  8. History of severe preeclampsia superimposed on chronic hypertension in prior pregnancy  9. History of antiphospholipid syndrome Cont lovenox  10. Maternal morbid obesity, antepartum (HCC) Cont baby Aspirin  Preterm labor symptoms and general obstetric precautions including but not limited to vaginal bleeding, contractions, leaking of fluid and fetal movement were reviewed in detail with the patient. I discussed the assessment and treatment plan with the patient. The patient was provided an opportunity to ask questions and all were answered. The patient agreed with the plan and demonstrated an understanding of the instructions. The patient was advised to call back or seek an  in-person office evaluation/go to MAU  at [facility] for any urgent or concerning symptoms. Please refer to After Visit Summary for other counseling recommendations.   I provided 16 minutes of face-to-face time during this encounter.  Return in about 2 weeks (around 07/18/2020) for high OB, virtual.  Future Appointments  Date Time Provider Department Center  07/10/2020  3:00 PM [facility] NURSE [facility]  07/10/2020  3:15 PM [facility] US3 [facility]  07/19/2020  3:00 PM [facility] NURSE [facility]  07/19/2020  3:15 PM [facility] US2 [facility]    Conan Bowens, MD Center for [facility]

## 2020-07-10 ENCOUNTER — Other Ambulatory Visit: Payer: Self-pay

## 2020-07-10 ENCOUNTER — Ambulatory Visit: Payer: PRIVATE HEALTH INSURANCE | Attending: Obstetrics and Gynecology

## 2020-07-10 ENCOUNTER — Ambulatory Visit: Payer: PRIVATE HEALTH INSURANCE | Admitting: *Deleted

## 2020-07-10 DIAGNOSIS — Z3009 Encounter for other general counseling and advice on contraception: Secondary | ICD-10-CM | POA: Insufficient documentation

## 2020-07-10 DIAGNOSIS — O09522 Supervision of elderly multigravida, second trimester: Secondary | ICD-10-CM | POA: Diagnosis not present

## 2020-07-10 DIAGNOSIS — D6861 Antiphospholipid syndrome: Secondary | ICD-10-CM

## 2020-07-10 DIAGNOSIS — O99113 Other diseases of the blood and blood-forming organs and certain disorders involving the immune mechanism complicating pregnancy, third trimester: Secondary | ICD-10-CM

## 2020-07-10 DIAGNOSIS — I10 Essential (primary) hypertension: Secondary | ICD-10-CM

## 2020-07-10 DIAGNOSIS — O24319 Unspecified pre-existing diabetes mellitus in pregnancy, unspecified trimester: Secondary | ICD-10-CM | POA: Diagnosis present

## 2020-07-10 DIAGNOSIS — O10919 Unspecified pre-existing hypertension complicating pregnancy, unspecified trimester: Secondary | ICD-10-CM | POA: Diagnosis not present

## 2020-07-10 DIAGNOSIS — O24112 Pre-existing diabetes mellitus, type 2, in pregnancy, second trimester: Secondary | ICD-10-CM | POA: Diagnosis not present

## 2020-07-10 DIAGNOSIS — Z362 Encounter for other antenatal screening follow-up: Secondary | ICD-10-CM

## 2020-07-10 DIAGNOSIS — O10912 Unspecified pre-existing hypertension complicating pregnancy, second trimester: Secondary | ICD-10-CM | POA: Diagnosis not present

## 2020-07-10 DIAGNOSIS — O99282 Endocrine, nutritional and metabolic diseases complicating pregnancy, second trimester: Secondary | ICD-10-CM

## 2020-07-10 DIAGNOSIS — O99280 Endocrine, nutritional and metabolic diseases complicating pregnancy, unspecified trimester: Secondary | ICD-10-CM | POA: Diagnosis present

## 2020-07-10 DIAGNOSIS — O09293 Supervision of pregnancy with other poor reproductive or obstetric history, third trimester: Secondary | ICD-10-CM

## 2020-07-10 DIAGNOSIS — O34219 Maternal care for unspecified type scar from previous cesarean delivery: Secondary | ICD-10-CM

## 2020-07-10 DIAGNOSIS — E039 Hypothyroidism, unspecified: Secondary | ICD-10-CM | POA: Diagnosis present

## 2020-07-10 DIAGNOSIS — Z3A32 32 weeks gestation of pregnancy: Secondary | ICD-10-CM

## 2020-07-10 DIAGNOSIS — O0990 Supervision of high risk pregnancy, unspecified, unspecified trimester: Secondary | ICD-10-CM

## 2020-07-10 IMAGING — US US MFM FETAL BPP W/O NON-STRESS
1 series · 15 of 28 positions shown · non-contrast
Comparison: none

[Series 1: us mfm fetal bpp w/o non-stress · 28 acquisitions, 15 frames shown]
[im 1/28]
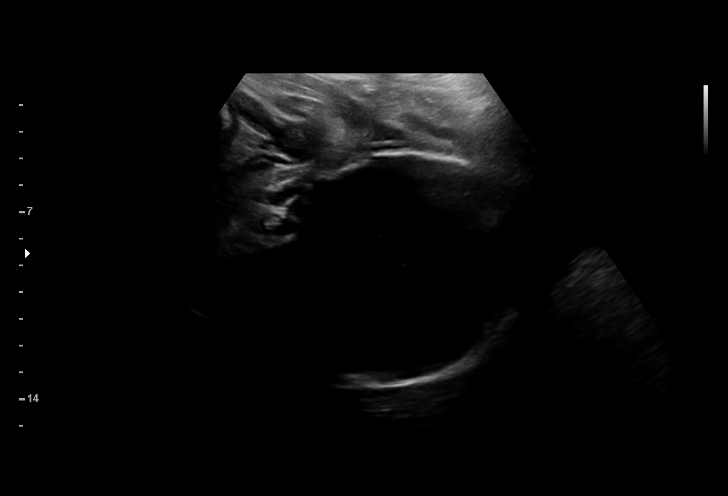
[im 3/28]
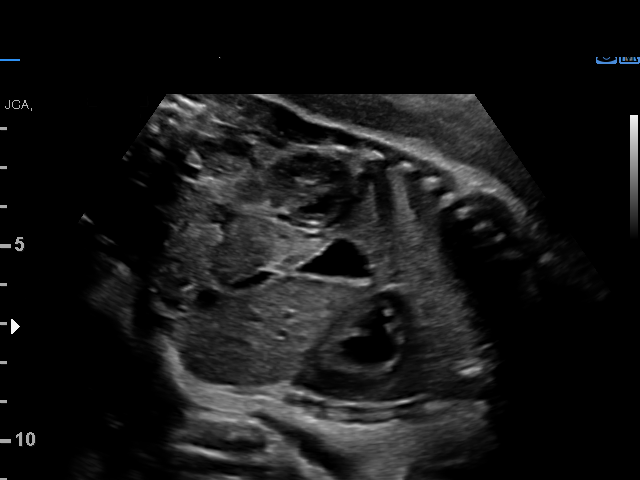
[im 5/28]
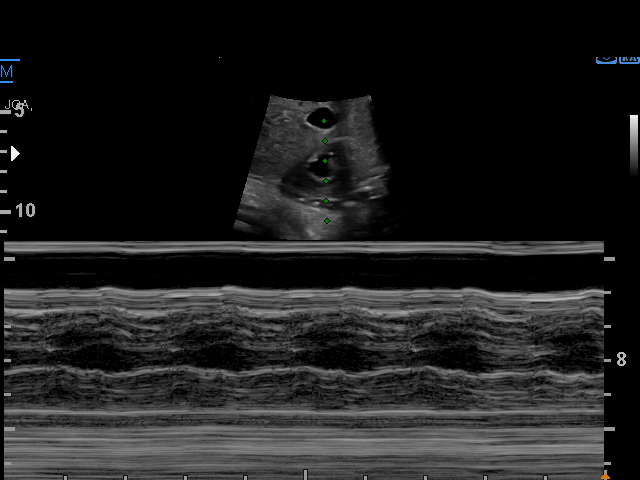
[im 7/28]
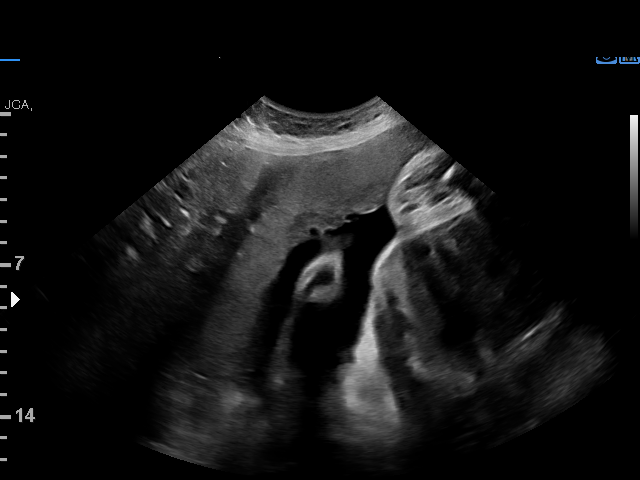
[im 9/28]
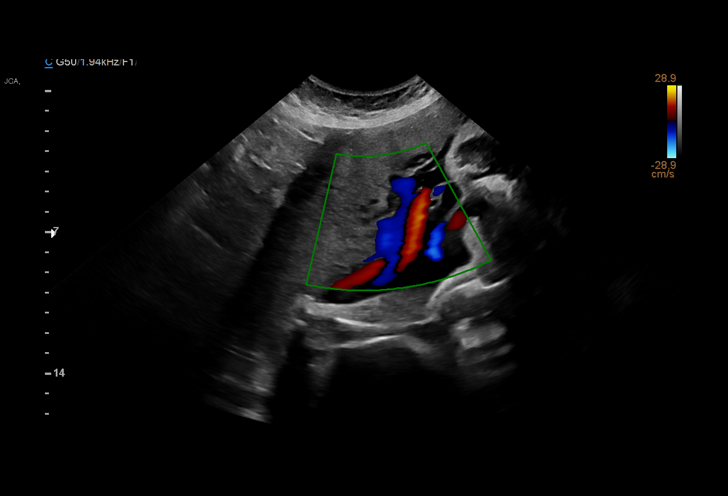
[im 11/28]
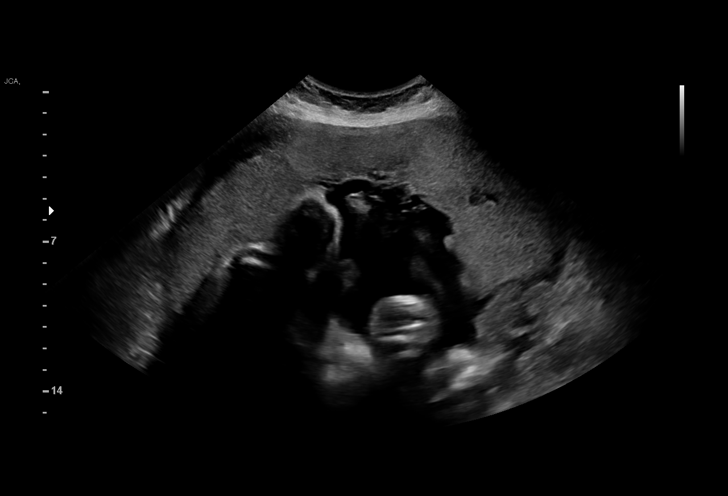
[im 13/28]
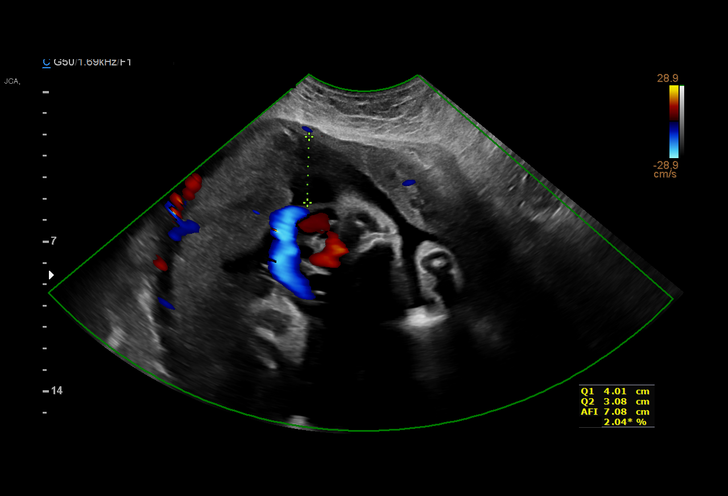
[im 15/28]
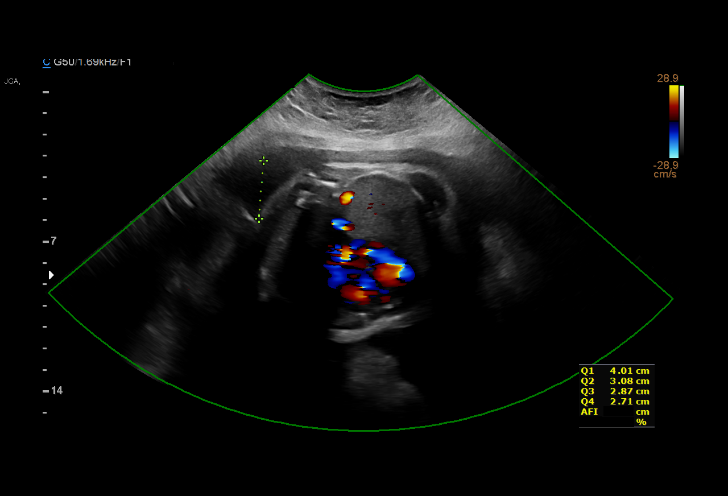
[im 16/28]
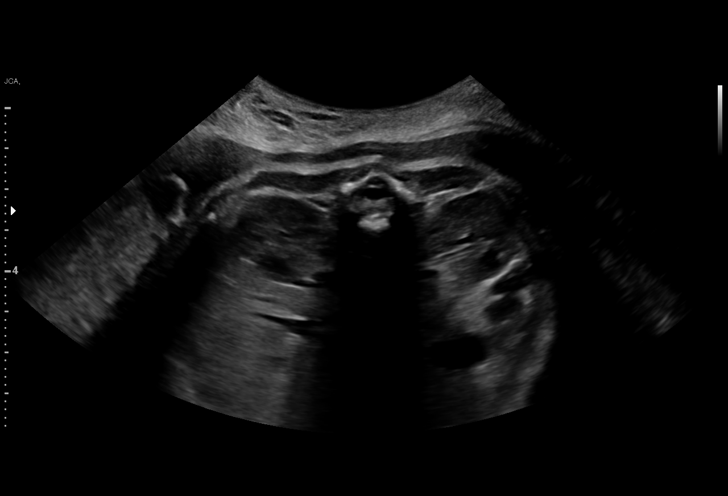
[im 18/28]
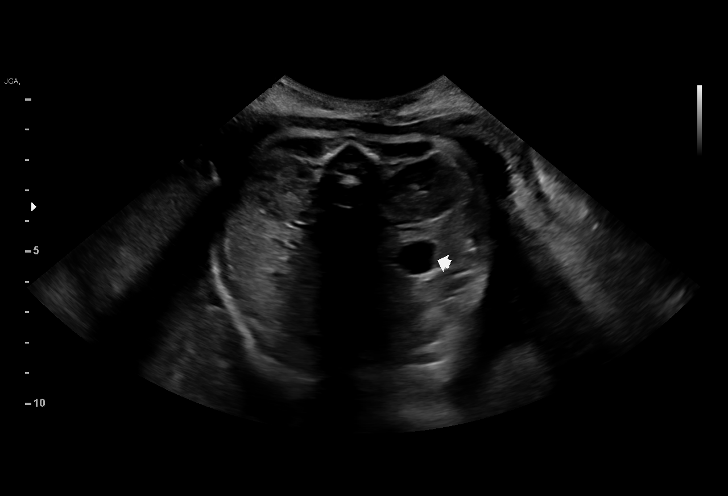
[im 20/28]
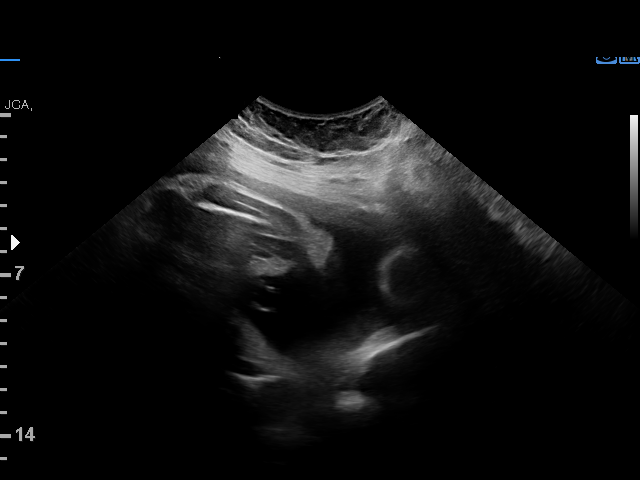
[im 22/28]
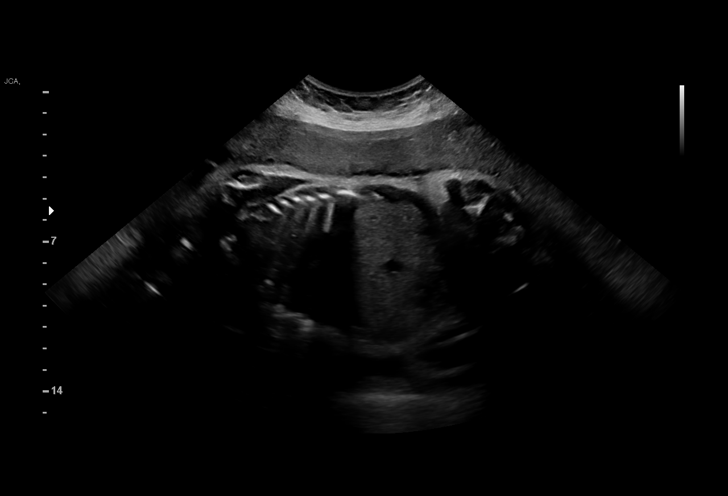
[im 24/28]
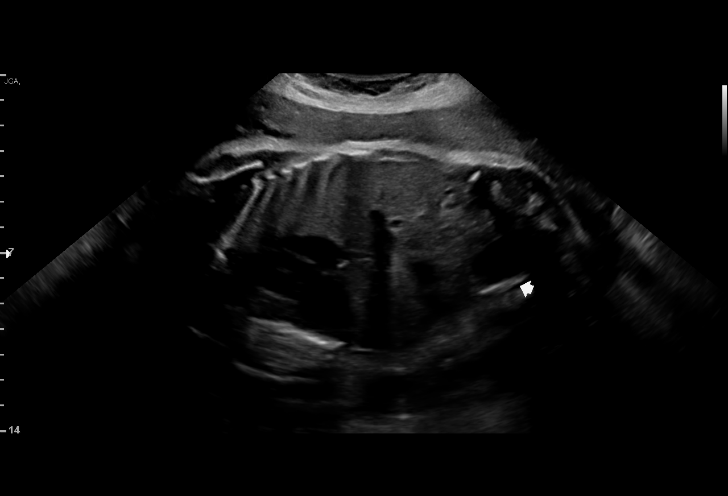
[im 26/28]
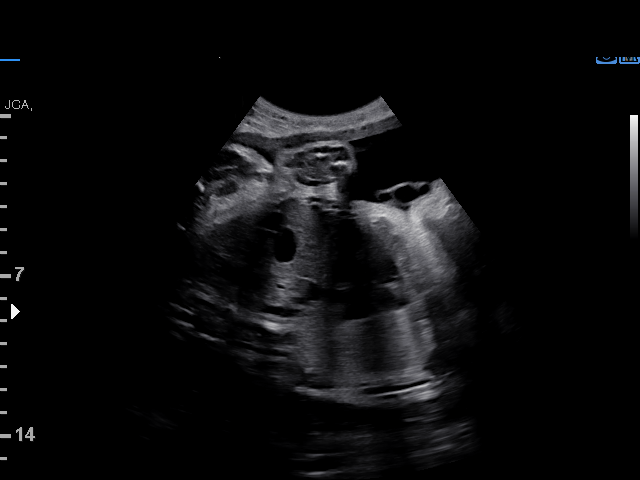
[im 28/28]
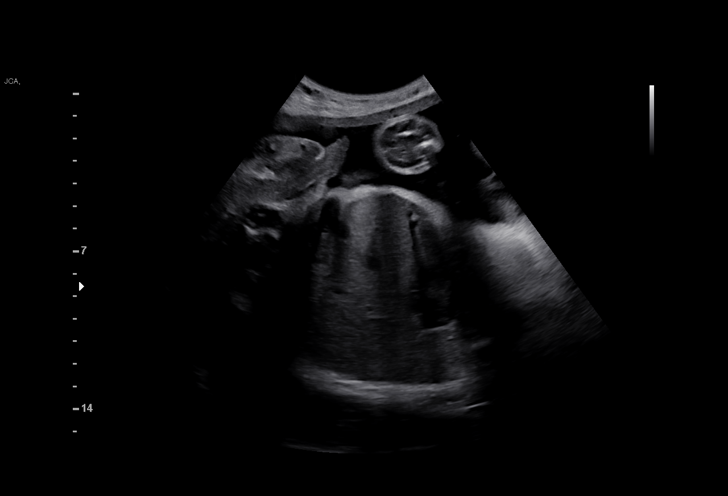

[15 of 28 positions shown; findings below may reference images not displayed]

Indications

 Pre-existing diabetes, type 2, in pregnancy,   [RO]
 second trimester
 Advanced maternal age multigravida 35+,        [RO]
 second trimester
 Hypertension - Chronic/Pre-existing            [RO]
 Obesity complicating pregnancy, second         [RO]
 trimester (BMI 43)
 Antiphospholipid syndrome complicating         O99.119, [RO]
 pregnancy, antepartum
 Hypothyroid                                    [RO] [RO]
 History of cesarean delivery, currently        [RO]
 pregnant
 Poor obstetric history: Previous               [RO]
 preeclampsia / eclampsia/gestational HTN
 Encounter for other antenatal screening        [RO]
 follow-up
 32 weeks gestation of pregnancy
Fetal Evaluation

 Num Of Fetuses:         1
 Fetal Heart Rate(bpm):  145
 Cardiac Activity:       Observed
 Presentation:           Cephalic
 Placenta:               Posterior
 P. Cord Insertion:      Visualized
 Amniotic Fluid
 AFI FV:      Within normal limits

 AFI Sum(cm)     %Tile       Largest Pocket(cm)
 12.67           37

 RUQ(cm)       RLQ(cm)       LUQ(cm)        LLQ(cm)

Biophysical Evaluation

 Amniotic F.V:   Within normal limits       F. Tone:        Observed
 F. Movement:    Observed                   Score:          [DATE]
 F. Breathing:   Observed
OB History

 Blood Type:   A+
 Maternal Racial/Ethnic Group:   White
 Gravidity:    5         Term:   1        Prem:   0        SAB:   3
 TOP:          0       Ectopic:  0        Living: 1
Gestational Age

 LMP:           32w 2d        Date:  [DATE]                 EDD:   [DATE]
 Best:          32w 2d     Det. By:  LMP  ([DATE])          EDD:   [DATE]
Comments

 This patient was seen for a biophysical profile due to
 maternal obesity, chronic hypertension currently treated with
 Norvasc and labetalol, the antiphospholipid antibody
 syndrome, and diabetes in pregnancy.  She denies any
 problems since her last exam.
 A biophysical profile performed today was [DATE].
 There was normal amniotic fluid noted on today's ultrasound
 exam.
 Another biophysical profile was scheduled in 1 week.

## 2020-07-17 ENCOUNTER — Ambulatory Visit: Payer: PRIVATE HEALTH INSURANCE

## 2020-07-18 ENCOUNTER — Encounter: Payer: Self-pay | Admitting: Obstetrics & Gynecology

## 2020-07-18 ENCOUNTER — Telehealth (INDEPENDENT_AMBULATORY_CARE_PROVIDER_SITE_OTHER): Payer: PRIVATE HEALTH INSURANCE | Admitting: Obstetrics & Gynecology

## 2020-07-18 VITALS — BP 129/78 | HR 100 | Wt 254.0 lb

## 2020-07-18 DIAGNOSIS — O99280 Endocrine, nutritional and metabolic diseases complicating pregnancy, unspecified trimester: Secondary | ICD-10-CM

## 2020-07-18 DIAGNOSIS — O0990 Supervision of high risk pregnancy, unspecified, unspecified trimester: Secondary | ICD-10-CM

## 2020-07-18 DIAGNOSIS — Z3A33 33 weeks gestation of pregnancy: Secondary | ICD-10-CM

## 2020-07-18 DIAGNOSIS — O10919 Unspecified pre-existing hypertension complicating pregnancy, unspecified trimester: Secondary | ICD-10-CM

## 2020-07-18 DIAGNOSIS — E039 Hypothyroidism, unspecified: Secondary | ICD-10-CM

## 2020-07-18 DIAGNOSIS — O24319 Unspecified pre-existing diabetes mellitus in pregnancy, unspecified trimester: Secondary | ICD-10-CM

## 2020-07-18 NOTE — Patient Instructions (Signed)
Return to office for any scheduled appointments. Call the office or go to the MAU at [facility] [place] if:  You begin to have strong, frequent contractions  Your water breaks.  Sometimes it is a big gush of fluid, sometimes it is just a trickle that keeps getting your panties wet or running down your legs  You have vaginal bleeding.  It is normal to have a small amount of spotting if your cervix was checked.   You do not feel your baby moving like normal.  If you do not, get something to eat and drink and lay down and focus on feeling your baby move.   If your baby is still not moving like normal, you should call the office or go to MAU.  Any other obstetric concerns.   

## 2020-07-18 NOTE — Progress Notes (Signed)
OBSTETRICS PRENATAL VIRTUAL VISIT ENCOUNTER NOTE  Provider location: Center for [facility] [place]   I connected with Kelli Miller on 07/18/20 at  1:15 PM EDT by MyChart Video Encounter at home and verified that I am speaking with the correct person using two identifiers.   I discussed the limitations, risks, security and privacy concerns of performing an evaluation and management service virtually and the availability of in person appointments. I also discussed with the patient that there may be a patient responsible charge related to this service. The patient expressed understanding and agreed to proceed. Subjective:  Kelli Miller is a 39 y.o. [phone] at [redacted]w[redacted]d being seen today for ongoing prenatal care.  She is currently monitored for the following issues for this high-risk pregnancy and has Advanced maternal age in multigravida; Preexisting hypertension complicating pregnancy, antepartum; PCOS (polycystic ovarian syndrome); GERD (gastroesophageal reflux disease); Psoriasis; Hypothyroidism; Diabetes mellitus (HCC); B12 deficiency; Vitamin D deficiency; Family history of premature CAD; History of antiphospholipid syndrome; Supervision of high risk pregnancy, antepartum; Preexisting diabetes complicating pregnancy, antepartum; Hypothyroid in pregnancy, antepartum; History of severe preeclampsia superimposed on chronic hypertension in prior pregnancy; History of cesarean section for failure to progress; Maternal morbid obesity, antepartum (HCC); Left ankle pain; and Unwanted fertility on their problem list.  Patient reports no complaints.  Contractions: Not present. Vag. Bleeding: None.  Movement: Present. Denies any leaking of fluid.   The following portions of the patient's history were reviewed and updated as appropriate: allergies, current medications, past family history, past medical history, past social history, past surgical history and problem list.   Objective:    Vitals:   07/18/20 0959  BP: 129/78  Pulse: 100  Weight: 254 lb (115.2 kg)    Fetal Status:     Movement: Present     General:  Alert, oriented and cooperative. Patient is in no acute distress.  Respiratory: Normal respiratory effort, no problems with respiration noted  Mental Status: Normal mood and affect. Normal behavior. Normal judgment and thought content.  Rest of physical exam deferred due to type of encounter  Imaging: [place] MFM FETAL BPP WO NON STRESS  Result Date: 07/10/2020 ----------------------------------------------------------------------  OBSTETRICS REPORT                       (Signed Final 07/10/2020 04:49 pm) ---------------------------------------------------------------------- Patient Info  ID #:       [id number]                          D.O.B.:  June 15, 1981 (39 yrs)  Name:       Kelli Miller                  Visit Date: 07/10/2020 03:41 pm ---------------------------------------------------------------------- Performed By  Attending:        Ma Rings MD         Ref. Address:     [address] [place]  Performed By:     Sandi Mealy        [facility]  Referred By:      Everardo All ---------------------------------------------------------------------- Orders  #  Description                           Code        Ordered By  1  [place] MFM FETAL BPP WO NON               [phone]    Kelli Miller     STRESS ----------------------------------------------------------------------  #  Order #                     Accession #                Episode #  1  [id number]                   [id number]                 [id number]  ---------------------------------------------------------------------- Indications  Pre-existing diabetes, type 2, in pregnancy,   O24.112  second trimester  Advanced maternal age multigravida 75+,        O76.522  second trimester  Hypertension - Chronic/Pre-existing            O10.019  Obesity complicating pregnancy, second         O99.212  trimester (BMI 43)  Antiphospholipid syndrome complicating         O99.119, D68.61  pregnancy, antepartum  Hypothyroid                                    O99.280 E03.9  History of cesarean delivery, currently        O34.219  pregnant  Poor obstetric history: Previous               O09.299  preeclampsia / eclampsia/gestational HTN  Encounter for other antenatal screening        Z36.2  follow-up  [redacted] weeks gestation of pregnancy                Z3A.32 ---------------------------------------------------------------------- Fetal Evaluation  Num Of Fetuses:         1  Fetal Heart Rate(bpm):  145  Cardiac Activity:       Observed  Presentation:  Cephalic  Placenta:               Posterior  P. Cord Insertion:      Visualized  Amniotic Fluid  AFI FV:      Within normal limits  AFI Sum(cm)     %Tile       Largest Pocket(cm)  12.67           37          4.01  RUQ(cm)       RLQ(cm)       LUQ(cm)        LLQ(cm)  4.01          2.71          3.08           2.87 ---------------------------------------------------------------------- Biophysical Evaluation  Amniotic F.V:   Within normal limits       F. Tone:        Observed  F. Movement:    Observed                   Score:          8/8  F. Breathing:   Observed ---------------------------------------------------------------------- OB History  Blood Type:   A+  Maternal Racial/Ethnic Group:   White  Gravidity:    5         Term:   1        Prem:   0        SAB:   3  TOP:          0       Ectopic:  0        Living: 1 ---------------------------------------------------------------------- Gestational Age  LMP:           32w 2d        Date:   11/27/19                 EDD:   09/02/20  Best:          Kelli Sans32w 2d     Det. By:  LMP  (11/27/19)          EDD:   09/02/20 ---------------------------------------------------------------------- Comments  This patient was seen for a biophysical profile due to  maternal obesity, chronic hypertension currently treated with  Norvasc and labetalol, the antiphospholipid antibody  syndrome, and diabetes in pregnancy.  She denies any  problems since her last exam.  A biophysical profile performed today was 8 out of 8.  There was normal amniotic fluid noted on today's ultrasound  exam.  Another biophysical profile was scheduled in 1 week. ----------------------------------------------------------------------                   Ma RingsVictor Fang, MD Electronically Signed Final Report   07/10/2020 04:49 pm ----------------------------------------------------------------------  US MFM OB FOLLOW UP  Result Date: 07/02/2020 ----------------------------------------------------------------------  OBSTETRICS REPORT                       (Signed Final 07/02/2020 04:01 pm) ---------------------------------------------------------------------- Patient Info  ID #:       [id number]                          D.O.B.:  [birth year] (39 yrs)  Name:       Kelli RoanMANDA R Sok                  Visit Date: 07/02/2020  03:25 pm ---------------------------------------------------------------------- Performed By  Attending:        Lin Landsman      Ref. Address:     [address]                    MD                                                             [place], [place]  Performed By:     Percell Boston          [facility]  Referred By:      [facility]  ---------------------------------------------------------------------- Orders  #  Description                           Code        Ordered By  1  [place] MFM OB FOLLOW UP                   [phone]    Kelli Miller ----------------------------------------------------------------------  #  Order #                     Accession #                Episode #  1  [id number]                   [id number]                 [id number] ---------------------------------------------------------------------- Indications  [redacted] weeks gestation of pregnancy                Z3A.31  Pre-existing diabetes, type 2, in pregnancy,   O24.112  second trimester  Advanced maternal age multigravida 38+,        O29.522  second trimester  Hypertension - Chronic/Pre-existing            O10.019  Obesity complicating pregnancy, second         O99.212  trimester (BMI 43)  Antiphospholipid syndrome complicating         O99.119, D68.61  pregnancy, antepartum  Hypothyroid                                    O99.280 E03.9  History of cesarean delivery, currently        O34.219  pregnant  Poor obstetric history: Previous               O09.299  preeclampsia / eclampsia/gestational HTN  Encounter for other antenatal screening        Z36.2  follow-up ---------------------------------------------------------------------- Vital Signs                                                 Height:        5'1" ---------------------------------------------------------------------- Fetal Evaluation  Num Of Fetuses:         1  Fetal Heart Rate(bpm):  123  Cardiac Activity:       Observed  Presentation:           Cephalic  Placenta:               Posterior  P. Cord Insertion:      Previously Visualized  Amniotic Fluid  AFI FV:      Within normal limits  AFI Sum(cm)     %Tile       Largest Pocket(cm)  11.33           25          5.46  RUQ(cm)       RLQ(cm)       LUQ(cm)        LLQ(cm)  5.46          4.07          0              1.8  ---------------------------------------------------------------------- Biometry  BPD:      79.3  mm     G. Age:  31w 6d         61  %    CI:        73.06   %    70 - 86                                                          FL/HC:      20.2   %    19.3 - 21.3  HC:      294.9  mm     G. Age:  32w 4d         53  %    HC/AC:      1.05        0.96 - 1.17  AC:      280.4  mm     G. Age:  32w 1d         74  %    FL/BPD:     75.0   %    71 - 87  FL:       59.5  mm     G. Age:  31w 0d         32  %  FL/AC:      21.2   %    20 - 24  Est. FW:    1837  gm      4 lb 1 oz     60  % ---------------------------------------------------------------------- OB History  Blood Type:   A+  Maternal Racial/Ethnic Group:   White  Gravidity:    5         Term:   1        Prem:   0        SAB:   3  TOP:          0       Ectopic:  0        Living: 1 ---------------------------------------------------------------------- Gestational Age  LMP:           31w 1d        Date:  11/27/19                 EDD:   09/02/20  U/S Today:     31w 6d                                        EDD:   08/28/20  Best:          31w 1d     Det. By:  LMP  (11/27/19)          EDD:   09/02/20 ---------------------------------------------------------------------- Anatomy  Cranium:               Appears normal         Aortic Arch:            Not well visualized  Cavum:                 Previously seen        Ductal Arch:            Previously seen  Ventricles:            Appears normal         Diaphragm:              Previously seen  Choroid Plexus:        Previously seen        Stomach:                Appears normal, left                                                                        sided  Cerebellum:            Previously seen        Abdomen:                Previously seen  Posterior Fossa:       Previously seen        Abdominal Wall:         Previously seen  Nuchal Fold:           Not applicable (>20    Cord Vessels:           Previously seen  wks GA)  Face:                  Orbits and profile     Kidneys:                Appear normal                         previously seen  Lips:                  Previously seen        Bladder:                Appears normal  Thoracic:              Appears normal         Spine:                  Limited views                                                                        previously seen  Heart:                 Previously seen        Upper Extremities:      Previously seen  RVOT:                  Previously seen        Lower Extremities:      Previously seen  LVOT:                  Previously seen  Other:  Female gender. Technically difficult due to maternal habitus and fetal          position. ---------------------------------------------------------------------- Cervix Uterus Adnexa  Cervix  Not visualized (advanced GA >24wks)  Uterus  No abnormality visualized.  Right Ovary  No adnexal mass visualized.  Left Ovary  No adnexal mass visualized.  Cul De Sac  No free fluid seen.  Adnexa  No abnormality visualized. ---------------------------------------------------------------------- Impression  Follow up growth for known chronic hypertension, T2DM,  APLAS, and elevated BMI  Normal interval growth with measurements consistent with  dates  There is good fetal movement and amniotic fluid volume  Blood pressures have been normal ---------------------------------------------------------------------- Recommendations  Follow up growth in 4 weeks  Initiate weekly testing in 1 week. ----------------------------------------------------------------------               Lin Landsman, MD Electronically Signed Final Report   07/02/2020 04:01 pm ----------------------------------------------------------------------   Assessment and Plan:  Pregnancy: [id number] at [redacted]w[redacted]d 1. Preexisting diabetes complicating pregnancy, antepartum Glucose readings scanned under media.  Recently seen by Endocrinologist who increased her  insulin, current regimen AM: Novolog 9u NPH 54u/ Lunch: Novolog 5u/HS: Novolog 11u NPH 84u. Only a few elevated values, mostly PP in 130s and one in the 140s recently. Will continue current regimen and Metformin. Also, continue antenatal testing and scans as per MFM. RCS+BTL already scheduled at 39 weeks.   2. Preexisting hypertension complicating pregnancy, antepartum Stable on Labetalol 200 mg po bid. Antenatal testing and scans as above. Preeclampsia precautions reviewed.  3. Hypothyroid in pregnancy, antepartum Stable  on Euthyrox 50 mcg daily.   4. [redacted] weeks gestation of pregnancy 5. Supervision of high risk pregnancy, antepartum Preterm labor symptoms and general obstetric precautions including but not limited to vaginal bleeding, contractions, leaking of fluid and fetal movement were reviewed in detail with the patient. I discussed the assessment and treatment plan with the patient. The patient was provided an opportunity to ask questions and all were answered. The patient agreed with the plan and demonstrated an understanding of the instructions. The patient was advised to call back or seek an in-person office evaluation/go to [facility] at [facility] for any urgent or concerning symptoms. Please refer to After Visit Summary for other counseling recommendations.   I provided 8 minutes of face-to-face time during this encounter.  Return in about 2 weeks (around 08/01/2020) for Virtual OB Visit.  Future Appointments  Date Time Provider Department Center  07/18/2020  1:15 PM Tereso Newcomer, MD [facility]  07/19/2020  3:00 PM [facility] NURSE [facility]  07/19/2020  3:15 PM [facility] US2 [facility]    Jaynie Collins, MD [facility]

## 2020-07-19 ENCOUNTER — Ambulatory Visit: Payer: PRIVATE HEALTH INSURANCE | Admitting: *Deleted

## 2020-07-19 ENCOUNTER — Other Ambulatory Visit: Payer: Self-pay

## 2020-07-19 ENCOUNTER — Ambulatory Visit: Payer: PRIVATE HEALTH INSURANCE | Attending: Obstetrics and Gynecology

## 2020-07-19 ENCOUNTER — Other Ambulatory Visit: Payer: Self-pay | Admitting: *Deleted

## 2020-07-19 DIAGNOSIS — O09293 Supervision of pregnancy with other poor reproductive or obstetric history, third trimester: Secondary | ICD-10-CM

## 2020-07-19 DIAGNOSIS — O24113 Pre-existing diabetes mellitus, type 2, in pregnancy, third trimester: Secondary | ICD-10-CM | POA: Diagnosis not present

## 2020-07-19 DIAGNOSIS — O99213 Obesity complicating pregnancy, third trimester: Secondary | ICD-10-CM

## 2020-07-19 DIAGNOSIS — D6861 Antiphospholipid syndrome: Secondary | ICD-10-CM

## 2020-07-19 DIAGNOSIS — Z3009 Encounter for other general counseling and advice on contraception: Secondary | ICD-10-CM | POA: Insufficient documentation

## 2020-07-19 DIAGNOSIS — E039 Hypothyroidism, unspecified: Secondary | ICD-10-CM

## 2020-07-19 DIAGNOSIS — O99280 Endocrine, nutritional and metabolic diseases complicating pregnancy, unspecified trimester: Secondary | ICD-10-CM | POA: Diagnosis present

## 2020-07-19 DIAGNOSIS — O24319 Unspecified pre-existing diabetes mellitus in pregnancy, unspecified trimester: Secondary | ICD-10-CM | POA: Insufficient documentation

## 2020-07-19 DIAGNOSIS — O99283 Endocrine, nutritional and metabolic diseases complicating pregnancy, third trimester: Secondary | ICD-10-CM

## 2020-07-19 DIAGNOSIS — O10913 Unspecified pre-existing hypertension complicating pregnancy, third trimester: Secondary | ICD-10-CM | POA: Diagnosis not present

## 2020-07-19 DIAGNOSIS — O09529 Supervision of elderly multigravida, unspecified trimester: Secondary | ICD-10-CM

## 2020-07-19 DIAGNOSIS — O10919 Unspecified pre-existing hypertension complicating pregnancy, unspecified trimester: Secondary | ICD-10-CM | POA: Diagnosis not present

## 2020-07-19 DIAGNOSIS — O99113 Other diseases of the blood and blood-forming organs and certain disorders involving the immune mechanism complicating pregnancy, third trimester: Secondary | ICD-10-CM

## 2020-07-19 DIAGNOSIS — O09523 Supervision of elderly multigravida, third trimester: Secondary | ICD-10-CM

## 2020-07-19 DIAGNOSIS — Z3A33 33 weeks gestation of pregnancy: Secondary | ICD-10-CM

## 2020-07-19 DIAGNOSIS — E669 Obesity, unspecified: Secondary | ICD-10-CM

## 2020-07-19 DIAGNOSIS — Z8759 Personal history of other complications of pregnancy, childbirth and the puerperium: Secondary | ICD-10-CM

## 2020-07-19 DIAGNOSIS — O0990 Supervision of high risk pregnancy, unspecified, unspecified trimester: Secondary | ICD-10-CM | POA: Insufficient documentation

## 2020-07-19 IMAGING — US US MFM FETAL BPP W/O NON-STRESS
1 series · 14 of 27 positions shown · non-contrast
Comparison: none

[Series 1: us mfm fetal bpp w/o non-stress · 27 acquisitions, 14 frames shown]
[im 1/27]
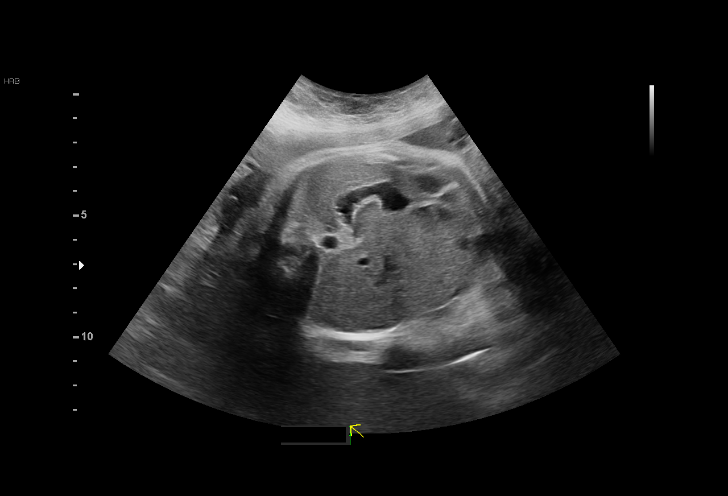
[im 3/27]
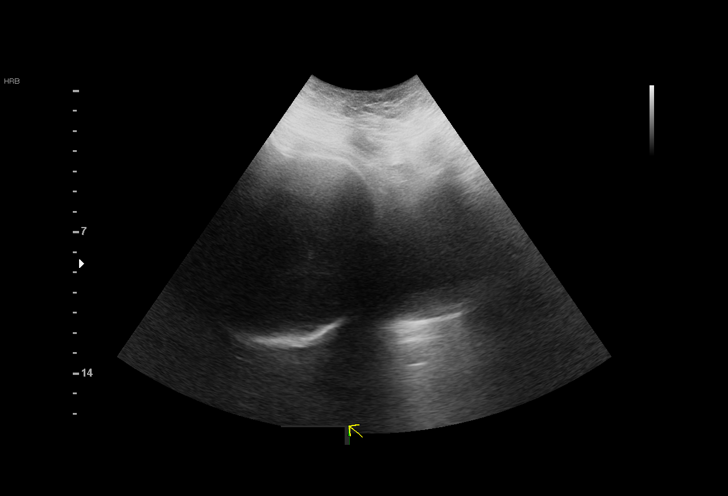
[im 5/27]
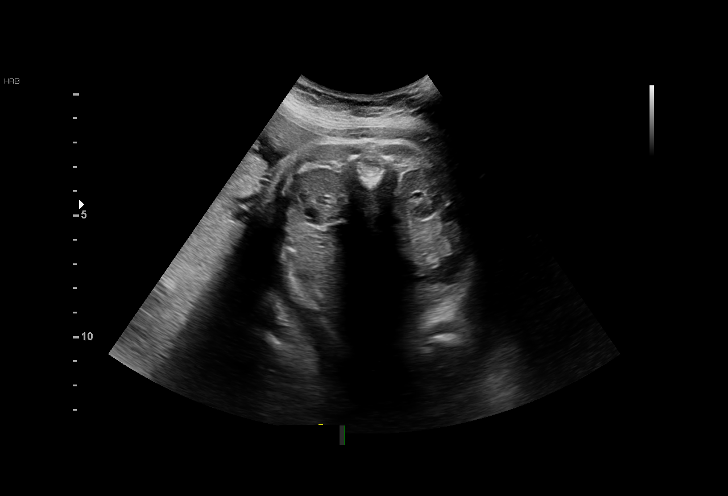
[im 7/27]
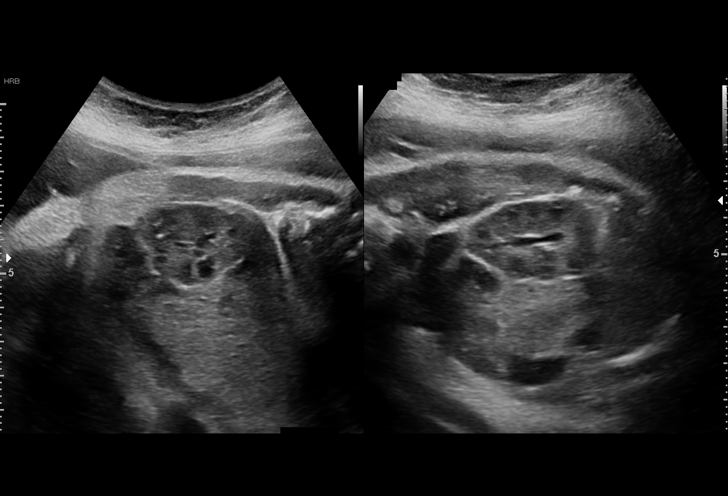
[im 9/27]
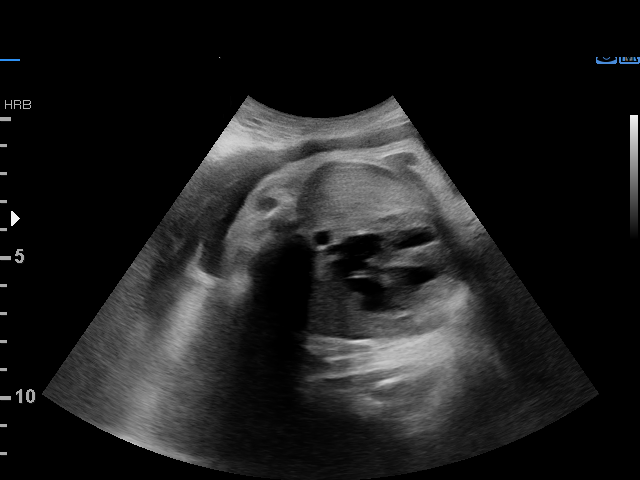
[im 11/27]
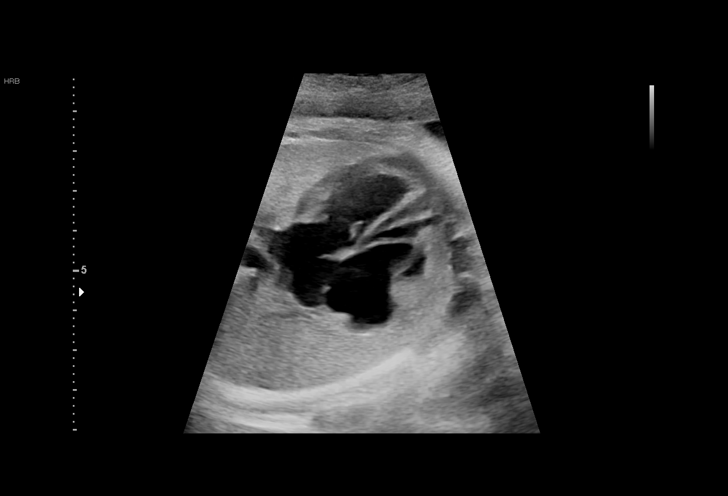
[im 13/27]
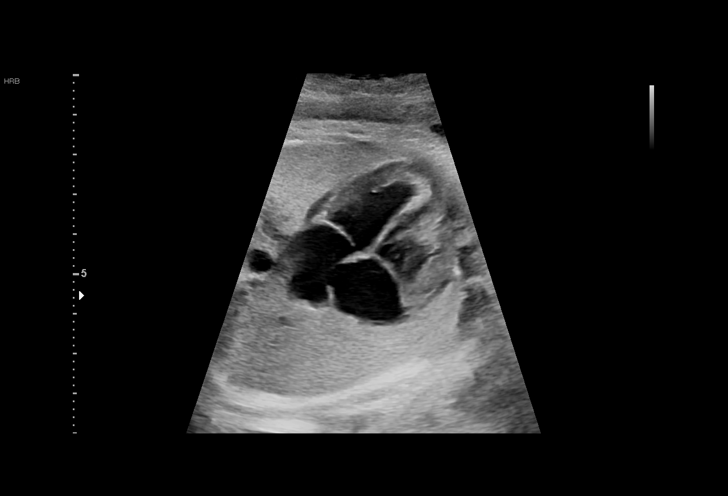
[im 15/27]
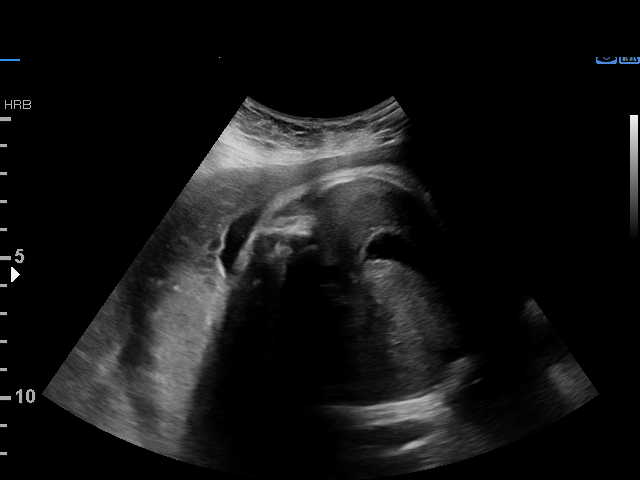
[im 17/27]
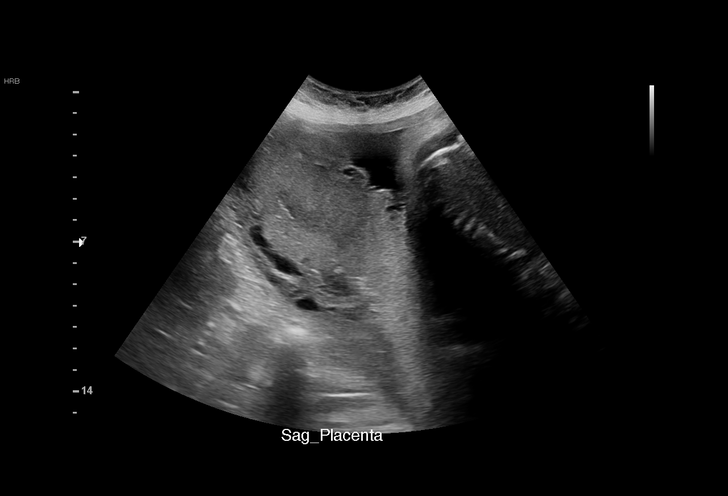
[im 19/27]
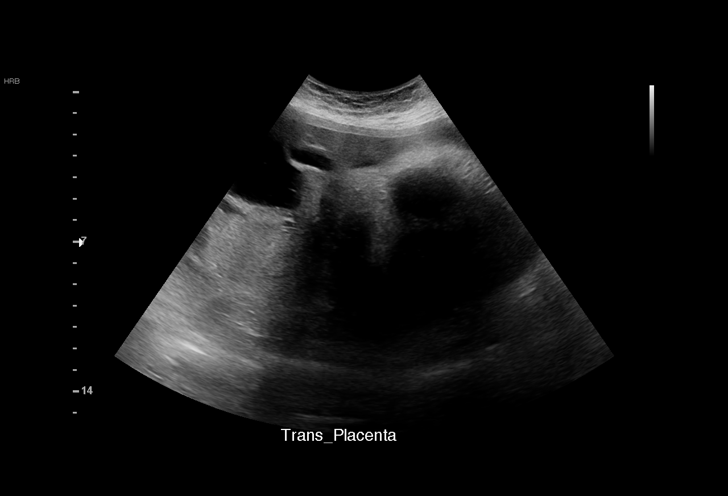
[im 21/27]
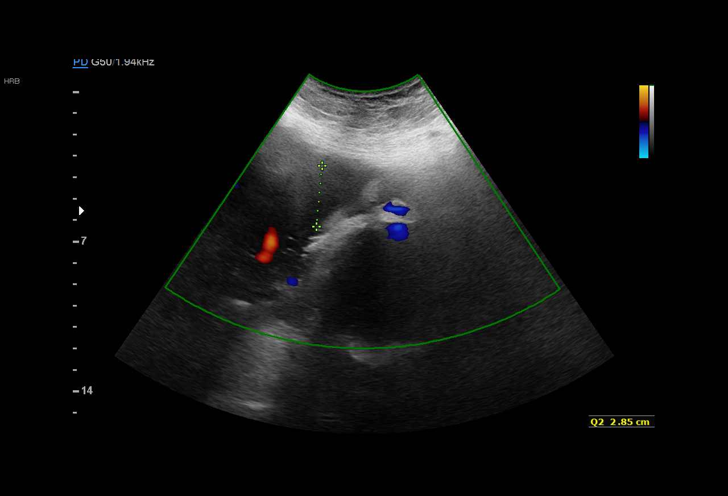
[im 23/27]
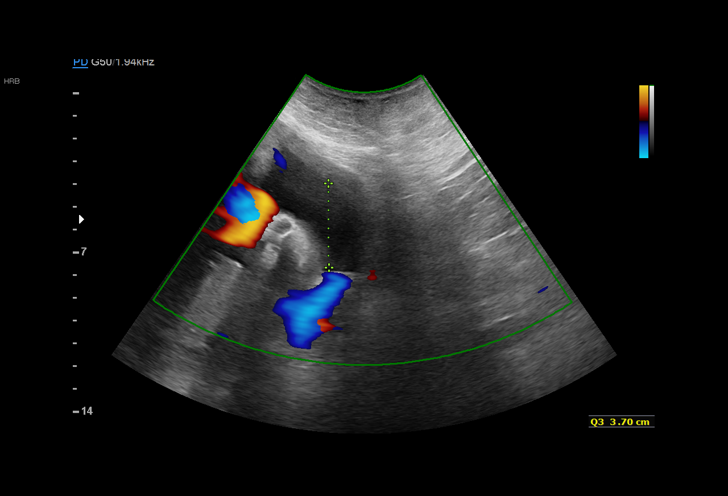
[im 25/27]
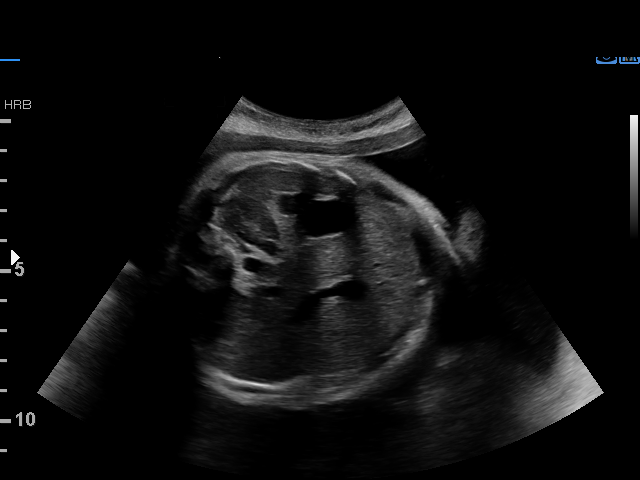
[im 27/27]
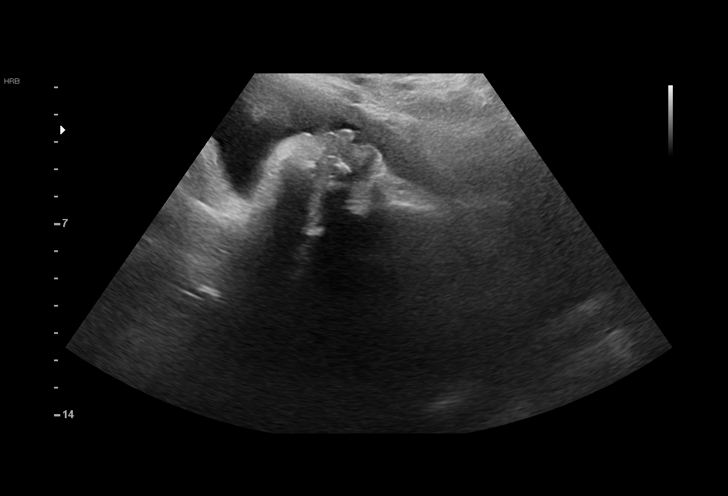

[14 of 27 positions shown; findings below may reference images not displayed]

Indications

 Pre-existing diabetes, type 2, in pregnancy,   [ZZ]
 second trimester
 Advanced maternal age multigravida 35+,        [ZZ]
 second trimester
 Hypertension - Chronic/Pre-existing            [ZZ]
 (labetalol)
 Obesity complicating pregnancy, second         [ZZ]
 trimester (BMI 43)
 Antiphospholipid syndrome complicating         O99.119, [ZZ]
 pregnancy, antepartum (lovenox)
 Hypothyroid                                    [ZZ] [ZZ]
 History of cesarean delivery, currently        [ZZ]
 pregnant
 Poor obstetric history: Previous               [ZZ]
 preeclampsia / eclampsia/gestational HTN
 33 weeks gestation of pregnancy
Vital Signs

                                                Height:        5'1"
Fetal Evaluation

 Num Of Fetuses:         1
 Fetal Heart Rate(bpm):  138
 Cardiac Activity:       Observed
 Presentation:           Cephalic
 Placenta:               Posterior
 Amniotic Fluid
 AFI FV:      Within normal limits

 AFI Sum(cm)     %Tile       Largest Pocket(cm)
 13.84           47

 RUQ(cm)       RLQ(cm)       LUQ(cm)        LLQ(cm)

Biophysical Evaluation

 Amniotic F.V:   Within normal limits       F. Tone:        Observed
 F. Movement:    Observed                   Score:          [DATE]
 F. Breathing:   Observed
OB History

 Blood Type:   A+
 Maternal Racial/Ethnic Group:   White
 Gravidity:    5         Term:   1        Prem:   0        SAB:   3
 TOP:          0       Ectopic:  0        Living: 1
Gestational Age

 LMP:           33w 4d        Date:  [DATE]                 EDD:   [DATE]
 Best:          33w 4d     Det. By:  LMP  ([DATE])          EDD:   [DATE]
Anatomy

 Cranium:               Appears normal         Aortic Arch:            Not well visualized
 Cavum:                 Appears normal         Ductal Arch:            Previously seen
 Ventricles:            Appears normal         Diaphragm:              Previously seen
 Choroid Plexus:        Previously seen        Stomach:                Appears normal, left
                                                                       sided
 Cerebellum:            Previously seen        Abdomen:                Previously seen
 Posterior Fossa:       Previously seen        Abdominal Wall:         Previously seen
 Nuchal Fold:           Not applicable (>20    Cord Vessels:           Previously seen
                        wks GA)
 Face:                  Orbits and profile     Kidneys:                Appear normal
                        previously seen
 Lips:                  Previously seen        Bladder:                Appears normal
 Thoracic:              Appears normal         Spine:                  Limited views
                                                                       previously seen
 Heart:                 Previously seen        Upper Extremities:      Previously seen
 RVOT:                  Previously seen        Lower Extremities:      Previously seen
 LVOT:                  Previously seen

 Other:  Male gender. Technically difficult due to maternal habitus and fetal
         position.
Cervix Uterus Adnexa

 Cervix
 Not visualized (advanced GA >[ZZ])
Impression

 Chronic hypertension.  Patient takes labetalol 200 mg twice
 daily.  Blood pressures today at our office was 163/64 and
 repeat 135/68 mmHg. She does not have symptoms of
 severe features of preeclampsia.

 Diabetes: Patient takes insulin NPH 84 units at night and 54
 units in the morning and NovoLog [DATE] units with meals.
 She reports some of her fasting levels are still high.

 Amniotic fluid is normal and good fetal activity is seen
 .Antenatal testing is reassuring. BPP [DATE].
 I have reassured the patient of the findings.
Recommendations

 -Fetal growth next week.
 --Continue weekly BPP till delivery.
 -If diabetes is not well controlled, delivery may be considered
 at 37 weeks gestation.  If both diabetes and hypertension are
 well controlled, consider delivery at 38 weeks gestation.

                 CUT

## 2020-07-25 ENCOUNTER — Other Ambulatory Visit: Payer: Self-pay

## 2020-07-25 ENCOUNTER — Ambulatory Visit: Payer: PRIVATE HEALTH INSURANCE | Attending: Obstetrics and Gynecology

## 2020-07-25 ENCOUNTER — Ambulatory Visit: Payer: PRIVATE HEALTH INSURANCE | Admitting: *Deleted

## 2020-07-25 DIAGNOSIS — O10913 Unspecified pre-existing hypertension complicating pregnancy, third trimester: Secondary | ICD-10-CM

## 2020-07-25 DIAGNOSIS — E039 Hypothyroidism, unspecified: Secondary | ICD-10-CM

## 2020-07-25 DIAGNOSIS — O99213 Obesity complicating pregnancy, third trimester: Secondary | ICD-10-CM

## 2020-07-25 DIAGNOSIS — O24319 Unspecified pre-existing diabetes mellitus in pregnancy, unspecified trimester: Secondary | ICD-10-CM

## 2020-07-25 DIAGNOSIS — O09529 Supervision of elderly multigravida, unspecified trimester: Secondary | ICD-10-CM | POA: Diagnosis not present

## 2020-07-25 DIAGNOSIS — O0990 Supervision of high risk pregnancy, unspecified, unspecified trimester: Secondary | ICD-10-CM | POA: Diagnosis present

## 2020-07-25 DIAGNOSIS — O09293 Supervision of pregnancy with other poor reproductive or obstetric history, third trimester: Secondary | ICD-10-CM

## 2020-07-25 DIAGNOSIS — O99113 Other diseases of the blood and blood-forming organs and certain disorders involving the immune mechanism complicating pregnancy, third trimester: Secondary | ICD-10-CM

## 2020-07-25 DIAGNOSIS — Z3009 Encounter for other general counseling and advice on contraception: Secondary | ICD-10-CM | POA: Diagnosis present

## 2020-07-25 DIAGNOSIS — O24112 Pre-existing diabetes mellitus, type 2, in pregnancy, second trimester: Secondary | ICD-10-CM

## 2020-07-25 DIAGNOSIS — O34219 Maternal care for unspecified type scar from previous cesarean delivery: Secondary | ICD-10-CM

## 2020-07-25 DIAGNOSIS — O99280 Endocrine, nutritional and metabolic diseases complicating pregnancy, unspecified trimester: Secondary | ICD-10-CM | POA: Diagnosis present

## 2020-07-25 DIAGNOSIS — O322XX0 Maternal care for transverse and oblique lie, not applicable or unspecified: Secondary | ICD-10-CM

## 2020-07-25 DIAGNOSIS — Z3A34 34 weeks gestation of pregnancy: Secondary | ICD-10-CM

## 2020-07-25 DIAGNOSIS — D6861 Antiphospholipid syndrome: Secondary | ICD-10-CM

## 2020-07-25 DIAGNOSIS — O09523 Supervision of elderly multigravida, third trimester: Secondary | ICD-10-CM

## 2020-07-25 DIAGNOSIS — O99283 Endocrine, nutritional and metabolic diseases complicating pregnancy, third trimester: Secondary | ICD-10-CM

## 2020-07-25 DIAGNOSIS — E669 Obesity, unspecified: Secondary | ICD-10-CM

## 2020-07-25 IMAGING — US US MFM FETAL BPP W/O NON-STRESS
1 series · 14 of 26 positions shown · non-contrast
Comparison: none

[Series 1: us mfm fetal bpp w/o non-stress · 26 acquisitions, 14 frames shown]
[im 1/26]
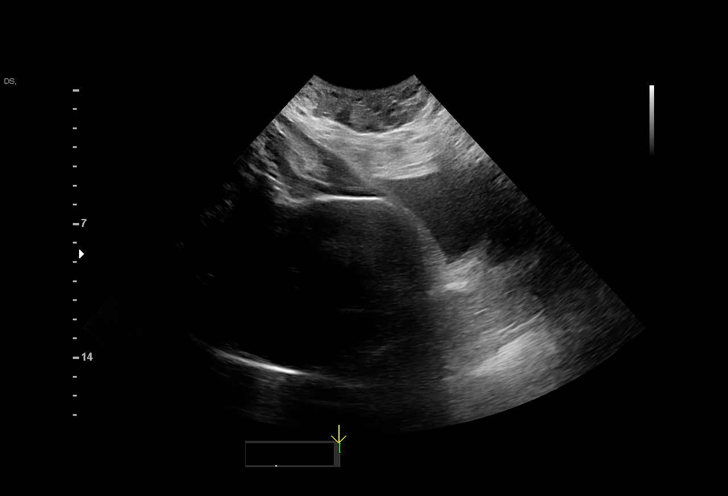
[im 3/26]
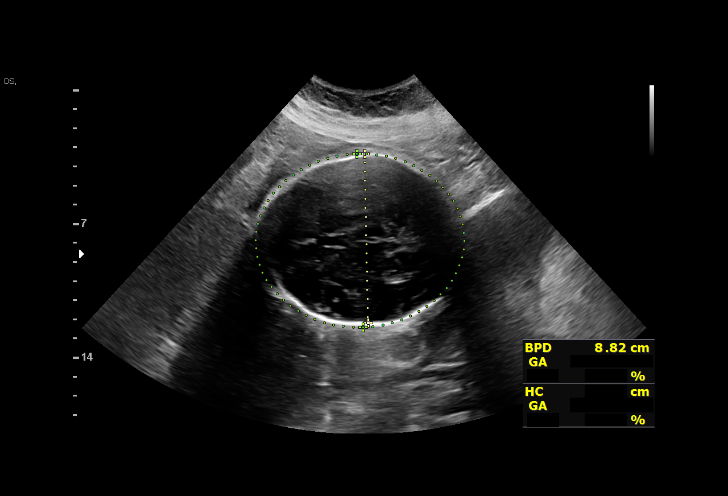
[im 5/26]
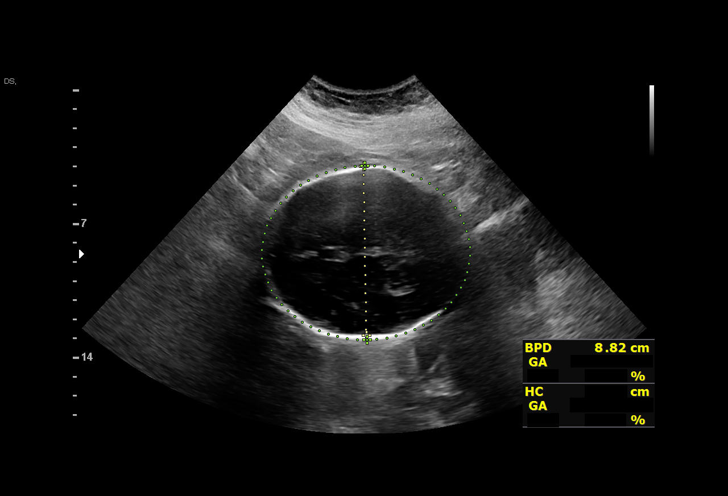
[im 7/26]
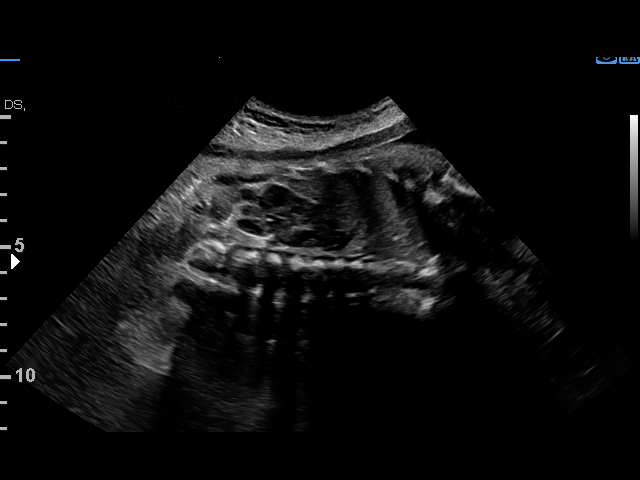
[im 9/26]
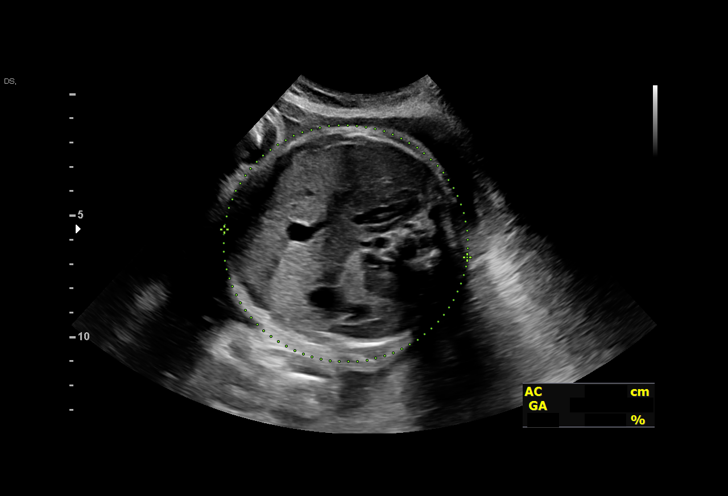
[im 11/26]
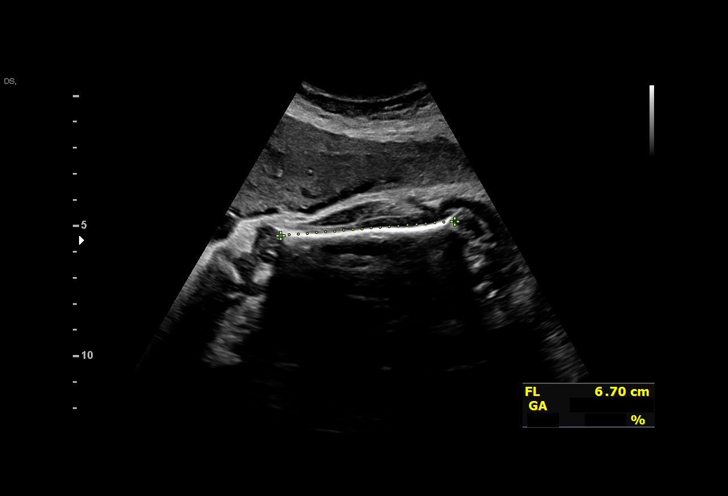
[im 13/26]
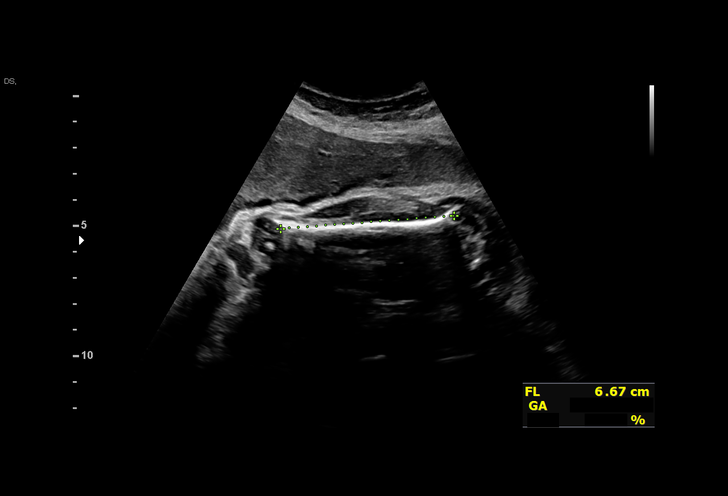
[im 14/26]
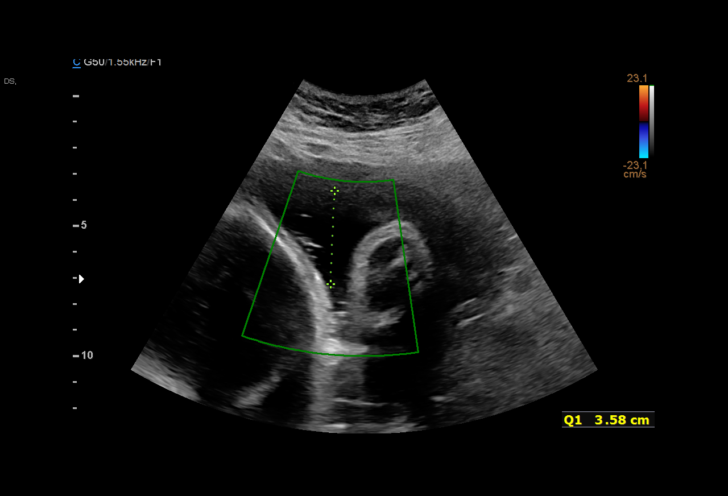
[im 16/26]
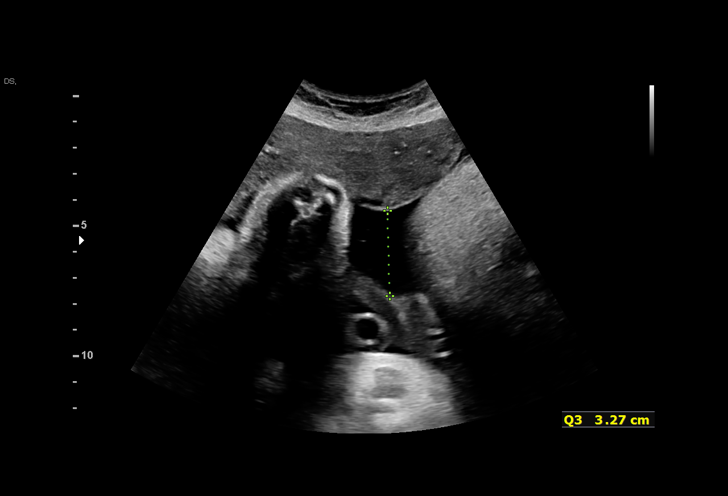
[im 18/26]
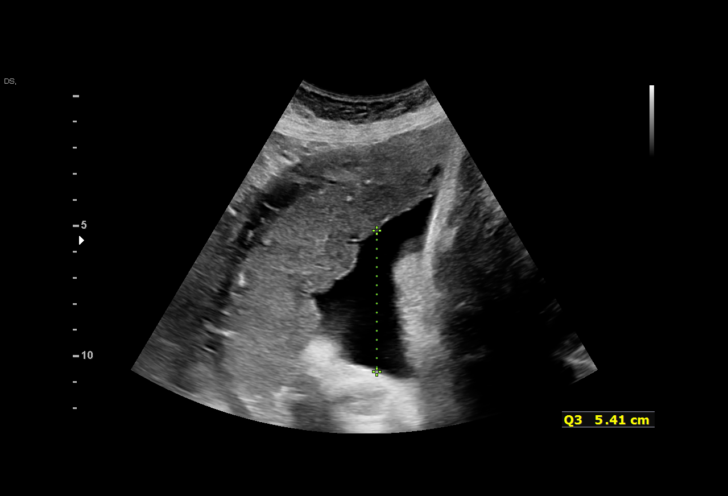
[im 20/26]
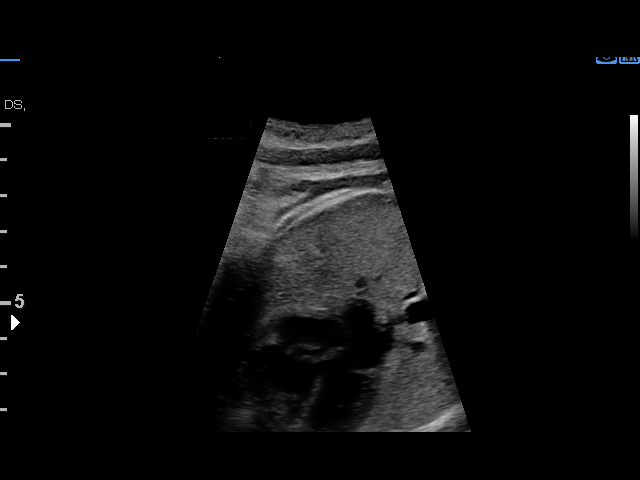
[im 22/26]
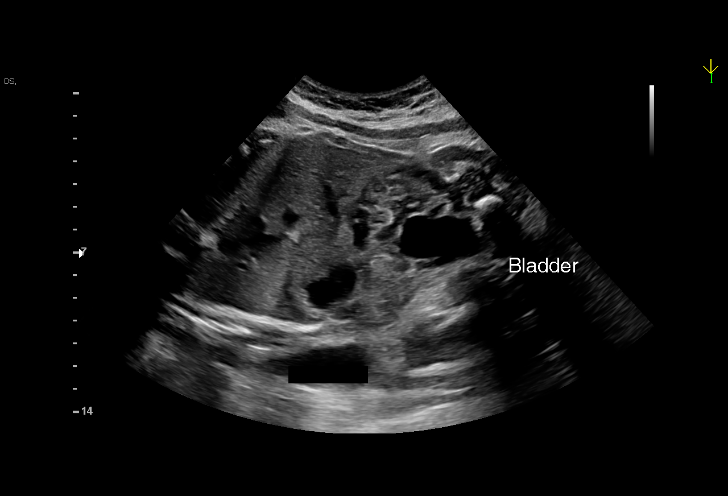
[im 24/26]
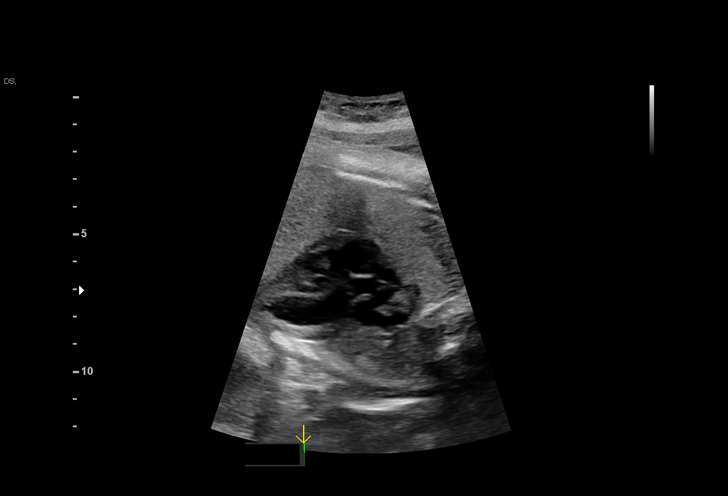
[im 26/26]
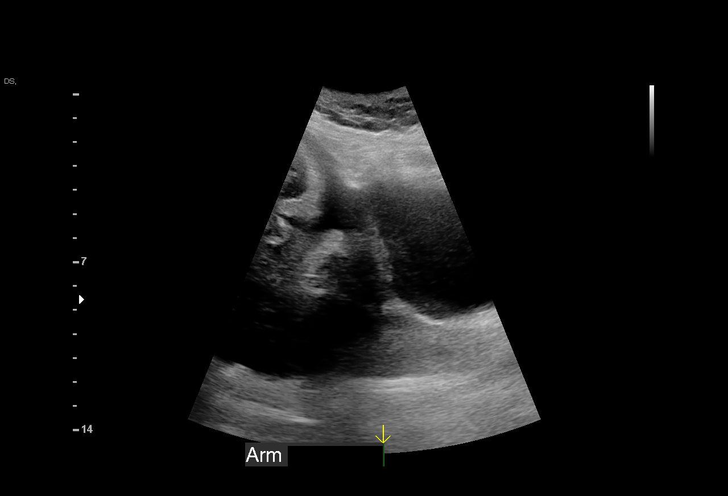

[14 of 26 positions shown; findings below may reference images not displayed]

Indications

 Pre-existing diabetes, type 2, in pregnancy,   [T7]
 second trimester
 Advanced maternal age multigravida 35+,        [T7]
 second trimester
 Hypertension - Chronic/Pre-existing            [T7]
 (labetalol)
 Obesity complicating pregnancy, second         [T7]
 trimester (BMI 43)
 Antiphospholipid syndrome complicating         O99.119, [T7]
 pregnancy, antepartum (lovenox)
 Hypothyroid                                    [T7] [T7]
 History of cesarean delivery, currently        [T7]
 pregnant
 Poor obstetric history: Previous               [T7]
 preeclampsia / eclampsia/gestational HTN
 34 weeks gestation of pregnancy
Vital Signs

                                                Height:        5'1"
Fetal Evaluation

 Num Of Fetuses:         1
 Cardiac Activity:       Observed
 Presentation:           Transverse, HMR (unstbale lie)
 Placenta:               Fundal
 P. Cord Insertion:      Previously Visualized
 Amniotic Fluid
 AFI FV:      Within normal limits

 AFI Sum(cm)     %Tile       Largest Pocket(cm)
 9.9             19

 RUQ(cm)       RLQ(cm)       LUQ(cm)        LLQ(cm)
 3.6           0             2
Biophysical Evaluation

 Amniotic F.V:   Pocket => 2 cm             F. Tone:        Observed
 F. Movement:    Observed                   Score:          [DATE]
 F. Breathing:   Observed
Biometry

 BPD:      88.3  mm     G. Age:  35w 5d         83  %    CI:        79.05   %    70 - 86
                                                         FL/HC:      21.3   %    19.4 -
 HC:       314   mm     G. Age:  35w 1d         33  %    HC/AC:      1.02        0.96 -
 AC:      306.8  mm     G. Age:  34w 4d         61  %    FL/BPD:     75.9   %    71 - 87
 FL:         67  mm     G. Age:  34w 3d         42  %    FL/AC:      21.8   %    20 - 24

 Est. FW:    [T7]  gm      5 lb 8 oz     54  %
OB History

 Blood Type:   A+
 Maternal Racial/Ethnic Group:   White
 Gravidity:    5         Term:   1        Prem:   0        SAB:   3
 TOP:          0       Ectopic:  0        Living: 1
Gestational Age

 LMP:           34w 3d        Date:  [DATE]                 EDD:   [DATE]
 U/S Today:     35w 0d                                        EDD:   [DATE]
 Best:          34w 3d     Det. By:  LMP  ([DATE])          EDD:   [DATE]
Anatomy

 Cranium:               Appears normal         RVOT:                   Appears normal
 Cavum:                 Appears normal         LVOT:                   Appears normal
 Face:                  Appears normal         Diaphragm:              Appears normal
                        (orbits and profile)
 Lips:                  Appears normal         Stomach:                Appears normal, left
                                                                       sided
 Thoracic:              Appears normal         Kidneys:                Appear normal
 Heart:                 Appears normal         Bladder:                Appears normal
                        (4CH, axis, and
                        situs)

 Other:  Other anatomy previously iamges and appeared normal.
Cervix Uterus Adnexa

 Cervix
 Not visualized (advanced GA >[T7])
Impression

 Chronic hypertension.  Patient takes labetalol 200 mg twice
 daily.  Blood pressures today at our office was 126/63 mm
 Hg.

 Diabetes: Patient takes insulin NPH 90 units at night and 60
 units in the morning and NovoLog [DATE] units with meals.
 She had increased insulin dosage as advised by her
 endocrinologist.

 Fetal growth is appropriate for gestational age .Amniotic fluid
 is normal and good fetal activity is seen .Antenatal testing is
 reassuring. BPP [DATE].

 I reassured the patient of the findings.

 We discussed timing of delivery.  If diabetes is suboptimally
 controlled, I recommend delivery at 37 weeks gestation.
Recommendations

 -Continue weekly BPP till delivery.
                 GALAN

## 2020-07-29 ENCOUNTER — Other Ambulatory Visit: Payer: Self-pay | Admitting: *Deleted

## 2020-07-29 MED ORDER — OMEPRAZOLE 20 MG PO CPDR: 20.0000 mg | DELAYED_RELEASE_CAPSULE | Freq: Every day | ORAL | 3 refills | Status: DC

## 2020-08-01 ENCOUNTER — Telehealth (INDEPENDENT_AMBULATORY_CARE_PROVIDER_SITE_OTHER): Payer: 59 | Admitting: Obstetrics and Gynecology

## 2020-08-01 ENCOUNTER — Encounter: Payer: Self-pay | Admitting: Obstetrics and Gynecology

## 2020-08-01 ENCOUNTER — Telehealth (HOSPITAL_COMMUNITY): Payer: Self-pay | Admitting: *Deleted

## 2020-08-01 VITALS — BP 137/87 | HR 104 | Wt 257.0 lb

## 2020-08-01 DIAGNOSIS — O99280 Endocrine, nutritional and metabolic diseases complicating pregnancy, unspecified trimester: Secondary | ICD-10-CM

## 2020-08-01 DIAGNOSIS — Z3009 Encounter for other general counseling and advice on contraception: Secondary | ICD-10-CM

## 2020-08-01 DIAGNOSIS — Z98891 History of uterine scar from previous surgery: Secondary | ICD-10-CM

## 2020-08-01 DIAGNOSIS — Z3A35 35 weeks gestation of pregnancy: Secondary | ICD-10-CM

## 2020-08-01 DIAGNOSIS — O24319 Unspecified pre-existing diabetes mellitus in pregnancy, unspecified trimester: Secondary | ICD-10-CM

## 2020-08-01 DIAGNOSIS — O10919 Unspecified pre-existing hypertension complicating pregnancy, unspecified trimester: Secondary | ICD-10-CM

## 2020-08-01 DIAGNOSIS — O0990 Supervision of high risk pregnancy, unspecified, unspecified trimester: Secondary | ICD-10-CM

## 2020-08-01 DIAGNOSIS — O99210 Obesity complicating pregnancy, unspecified trimester: Secondary | ICD-10-CM

## 2020-08-01 DIAGNOSIS — O09299 Supervision of pregnancy with other poor reproductive or obstetric history, unspecified trimester: Secondary | ICD-10-CM

## 2020-08-01 DIAGNOSIS — E039 Hypothyroidism, unspecified: Secondary | ICD-10-CM

## 2020-08-01 NOTE — Patient Instructions (Addendum)
TABBETHA KUTSCHER  08/01/2020   Your procedure is scheduled on:  08/12/2020  Arrive at 0800 at [employer] on [address] at [facility]  and [employer]. You are invited to use the FREE valet parking or use the Visitor's parking deck.  Pick up the phone at the desk and dial [phone].  Call this number if you have problems the morning of surgery: [phone]  Remember:   Do not eat food:(After Midnight) Desps de medianoche.  Do not drink clear liquids: (After Midnight) Desps de medianoche.  Take these medicines the morning of surgery with A SIP OF WATER:  Take your levothyroxine, norvasc, labetalol and Novolog insulin as prescribed.  Take Novolin N 45 units insulin at bedtime.  Do not take metformin the day of surgery.  No insulin the day of surgery.  Take your lovenox at 6 PM the night before your surgery.     Do not wear jewelry, make-up or nail polish.  Do not wear lotions, powders, or perfumes. Do not wear deodorant.  Do not shave 48 hours prior to surgery.  Do not bring valuables to the hospital.  [facility] is not   responsible for any belongings or valuables brought to the hospital.  Contacts, dentures or bridgework may not be worn into surgery.  Leave suitcase in the car. After surgery it may be brought to your room.  For patients admitted to the hospital, checkout time is 11:00 AM the day of              discharge.      Please read over the following fact sheets that you were given:     Preparing for Surgery

## 2020-08-01 NOTE — Telephone Encounter (Signed)
Preadmission screen  

## 2020-08-01 NOTE — Progress Notes (Signed)
PRENATAL VISIT NOTE  Subjective:  Kelli Miller is a 39 y.o. G5P1031 at [redacted]w[redacted]d being seen today for ongoing prenatal care.  She is currently monitored for the following issues for this high-risk pregnancy and has Advanced maternal age in multigravida; Preexisting hypertension complicating pregnancy, antepartum; PCOS (polycystic ovarian syndrome); GERD (gastroesophageal reflux disease); Psoriasis; Hypothyroidism; Diabetes mellitus (HCC); B12 deficiency; Vitamin D deficiency; Family history of premature CAD; History of antiphospholipid syndrome; Supervision of high risk pregnancy, antepartum; Preexisting diabetes complicating pregnancy, antepartum; Hypothyroid in pregnancy, antepartum; History of severe preeclampsia superimposed on chronic hypertension in prior pregnancy; History of cesarean section for failure to progress; Maternal morbid obesity, antepartum (HCC); Left ankle pain; and Unwanted fertility on their problem list.  Patient reports no complaints.  Contractions: Irritability. Vag. Bleeding: None.  Movement: Present. Denies leaking of fluid.   The following portions of the patient's history were reviewed and updated as appropriate: allergies, current medications, past family history, past medical history, past social history, past surgical history and problem list.   Objective:   Vitals:   08/01/20 0835  BP: 137/87  Pulse: (!) 104  Weight: 257 lb (116.6 kg)    Fetal Status:     Movement: Present     General:  Alert, oriented and cooperative. Patient is in no acute distress.  Skin: Skin is warm and dry. No rash noted.   Cardiovascular: Normal heart rate noted  Respiratory: Normal respiratory effort, no problems with respiration noted  Abdomen: Soft, gravid, appropriate for gestational age.  Pain/Pressure: Present     Pelvic: Cervical exam deferred        Extremities: Normal range of motion.     Mental Status: Normal mood and affect. Normal behavior. Normal judgment and thought  content.   Assessment and Plan:  Pregnancy: G5P1031 at [redacted]w[redacted]d  1. [redacted] weeks gestation of pregnancy  2. Unwanted fertility For BTL  3. Preexisting diabetes complicating pregnancy, antepartum - Currently on 60 units am, 90 QHS - novolog 9, 7 11 units with meals - Reviewed CBGs,  - Will increase NPH to 66 units am and 96 units pm - Increase novolog to 11 units with breakfast, 7 units with lunch and 13 units with dinner Considering subotimally controlled diabetes, will move c-section to 08/12/20 at 37 weeks, per MFM note, pt understands risks of prematurity are slightly increased with delivery at 37 weeks, she is in agreement with plan - will move CS to 37 weeks  4. Hypothyroid in pregnancy, antepartum Cont synthroid  5. Supervision of high risk pregnancy, antepartum  6. Preexisting hypertension complicating pregnancy, antepartum BP wnl today Cont labetalol 200 mg dialy Cont baby aspirin  7. History of severe preeclampsia superimposed on chronic hypertension in prior pregnancy  8. History of cesarean section for failure to progress For RCS scheduled for 08/12/20  9. Maternal morbid obesity, antepartum (HCC)  Preterm labor symptoms and general obstetric precautions including but not limited to vaginal bleeding, contractions, leaking of fluid and fetal movement were reviewed in detail with the patient. Please refer to After Visit Summary for other counseling recommendations.   Return in about 1 week (around 08/08/2020) for high OB, in person, 36 week swabs.  Future Appointments  Date Time Provider Department Center  08/02/2020  9:15 AM [facility] NURSE [facility]  08/02/2020  9:30 AM [facility] US3 [facility]  08/08/2020  3:00 PM [facility] NURSE [facility]  08/08/2020  3:15 PM [facility] US2 [facility]  08/16/2020  3:15 PM [facility] NURSE [facility]  [facility]  08/16/2020  3:30 PM [facility] US3 [facility]  08/22/2020  3:00 PM [facility] NURSE [facility]  08/22/2020  3:15 PM [facility] US3  [facility]    Conan Bowens, MD

## 2020-08-02 ENCOUNTER — Encounter (HOSPITAL_COMMUNITY): Payer: Self-pay

## 2020-08-02 ENCOUNTER — Ambulatory Visit (HOSPITAL_BASED_OUTPATIENT_CLINIC_OR_DEPARTMENT_OTHER): Payer: 59

## 2020-08-02 ENCOUNTER — Inpatient Hospital Stay (HOSPITAL_COMMUNITY)
Admission: AD | Admit: 2020-08-02 | Discharge: 2020-08-03 | Disposition: A | Discharge status: Home / Self Care | Payer: 59 | Attending: Obstetrics and Gynecology | Admitting: Obstetrics and Gynecology

## 2020-08-02 ENCOUNTER — Encounter (HOSPITAL_COMMUNITY): Payer: Self-pay | Admitting: Obstetrics and Gynecology

## 2020-08-02 ENCOUNTER — Ambulatory Visit: Payer: 59 | Admitting: *Deleted

## 2020-08-02 ENCOUNTER — Other Ambulatory Visit: Payer: Self-pay

## 2020-08-02 DIAGNOSIS — O0990 Supervision of high risk pregnancy, unspecified, unspecified trimester: Secondary | ICD-10-CM

## 2020-08-02 DIAGNOSIS — O99283 Endocrine, nutritional and metabolic diseases complicating pregnancy, third trimester: Secondary | ICD-10-CM

## 2020-08-02 DIAGNOSIS — O24319 Unspecified pre-existing diabetes mellitus in pregnancy, unspecified trimester: Secondary | ICD-10-CM

## 2020-08-02 DIAGNOSIS — D6861 Antiphospholipid syndrome: Secondary | ICD-10-CM

## 2020-08-02 DIAGNOSIS — E282 Polycystic ovarian syndrome: Secondary | ICD-10-CM | POA: Diagnosis not present

## 2020-08-02 DIAGNOSIS — Z7982 Long term (current) use of aspirin: Secondary | ICD-10-CM | POA: Insufficient documentation

## 2020-08-02 DIAGNOSIS — O09523 Supervision of elderly multigravida, third trimester: Secondary | ICD-10-CM | POA: Insufficient documentation

## 2020-08-02 DIAGNOSIS — O09293 Supervision of pregnancy with other poor reproductive or obstetric history, third trimester: Secondary | ICD-10-CM | POA: Insufficient documentation

## 2020-08-02 DIAGNOSIS — E039 Hypothyroidism, unspecified: Secondary | ICD-10-CM

## 2020-08-02 DIAGNOSIS — O10913 Unspecified pre-existing hypertension complicating pregnancy, third trimester: Secondary | ICD-10-CM

## 2020-08-02 DIAGNOSIS — O99280 Endocrine, nutritional and metabolic diseases complicating pregnancy, unspecified trimester: Secondary | ICD-10-CM | POA: Insufficient documentation

## 2020-08-02 DIAGNOSIS — Z8759 Personal history of other complications of pregnancy, childbirth and the puerperium: Secondary | ICD-10-CM | POA: Diagnosis not present

## 2020-08-02 DIAGNOSIS — Z3009 Encounter for other general counseling and advice on contraception: Secondary | ICD-10-CM

## 2020-08-02 DIAGNOSIS — Z87891 Personal history of nicotine dependence: Secondary | ICD-10-CM | POA: Insufficient documentation

## 2020-08-02 DIAGNOSIS — Z6790 Unspecified blood type, Rh positive: Secondary | ICD-10-CM

## 2020-08-02 DIAGNOSIS — Z3689 Encounter for other specified antenatal screening: Secondary | ICD-10-CM

## 2020-08-02 DIAGNOSIS — O26893 Other specified pregnancy related conditions, third trimester: Secondary | ICD-10-CM | POA: Diagnosis not present

## 2020-08-02 DIAGNOSIS — O4690 Antepartum hemorrhage, unspecified, unspecified trimester: Secondary | ICD-10-CM

## 2020-08-02 DIAGNOSIS — Z794 Long term (current) use of insulin: Secondary | ICD-10-CM | POA: Insufficient documentation

## 2020-08-02 DIAGNOSIS — Z79890 Hormone replacement therapy (postmenopausal): Secondary | ICD-10-CM | POA: Insufficient documentation

## 2020-08-02 DIAGNOSIS — O24113 Pre-existing diabetes mellitus, type 2, in pregnancy, third trimester: Secondary | ICD-10-CM

## 2020-08-02 DIAGNOSIS — Z3A35 35 weeks gestation of pregnancy: Secondary | ICD-10-CM | POA: Diagnosis not present

## 2020-08-02 DIAGNOSIS — R109 Unspecified abdominal pain: Secondary | ICD-10-CM | POA: Insufficient documentation

## 2020-08-02 DIAGNOSIS — O99113 Other diseases of the blood and blood-forming organs and certain disorders involving the immune mechanism complicating pregnancy, third trimester: Secondary | ICD-10-CM | POA: Insufficient documentation

## 2020-08-02 DIAGNOSIS — O09529 Supervision of elderly multigravida, unspecified trimester: Secondary | ICD-10-CM | POA: Insufficient documentation

## 2020-08-02 DIAGNOSIS — O4693 Antepartum hemorrhage, unspecified, third trimester: Secondary | ICD-10-CM | POA: Insufficient documentation

## 2020-08-02 DIAGNOSIS — E119 Type 2 diabetes mellitus without complications: Secondary | ICD-10-CM

## 2020-08-02 LAB — URINALYSIS, ROUTINE W REFLEX MICROSCOPIC
Bilirubin Urine: NEGATIVE
Glucose, UA: NEGATIVE mg/dL
Ketones, ur: NEGATIVE mg/dL
Leukocytes,Ua: NEGATIVE
Nitrite: NEGATIVE
Protein, ur: NEGATIVE mg/dL
Specific Gravity, Urine: 1.004 — ABNORMAL LOW (ref 1.005–1.030)
pH: 6 (ref 5.0–8.0)

## 2020-08-02 IMAGING — US US MFM FETAL BPP W/O NON-STRESS
1 series · 13 of 15 positions shown · non-contrast
Comparison: none

[Series 1: us mfm fetal bpp w/o non-stress · 15 acquisitions, 13 frames shown]
[im 1/15]
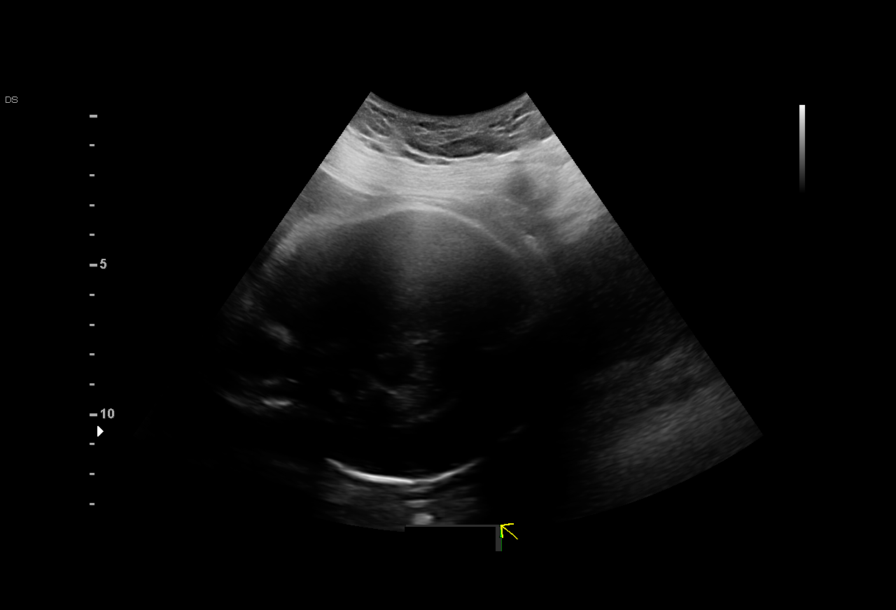
[im 2/15]
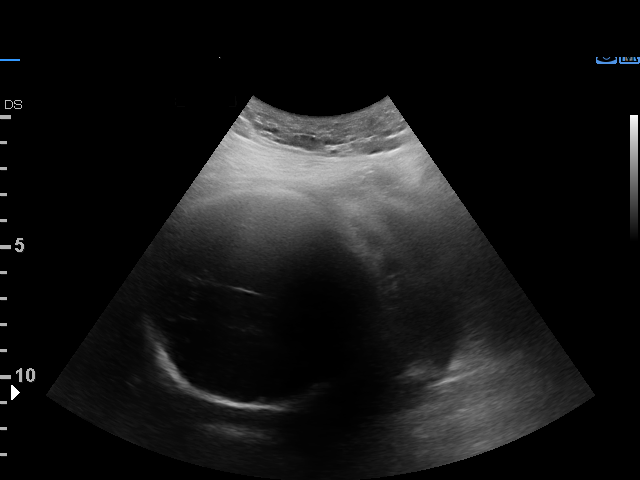
[im 3/15]
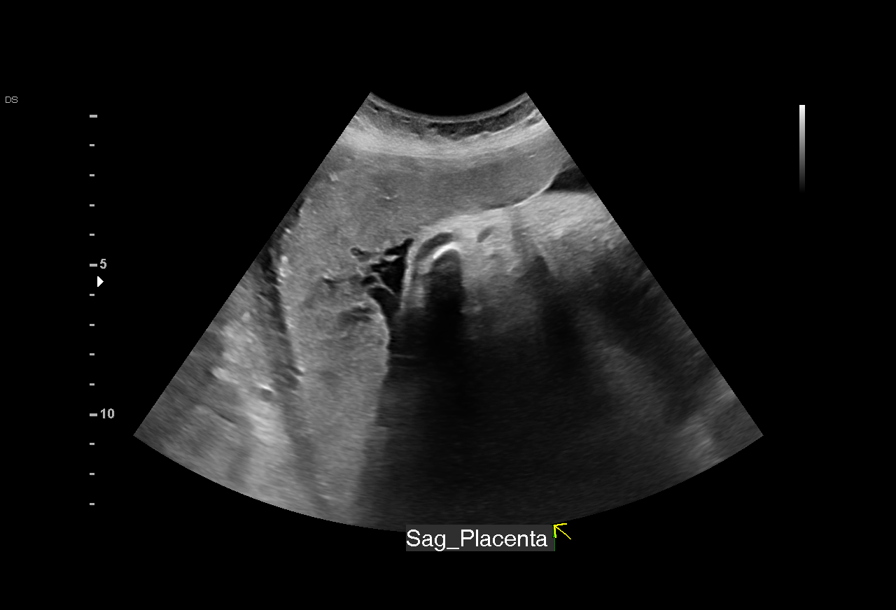
[im 5/15]
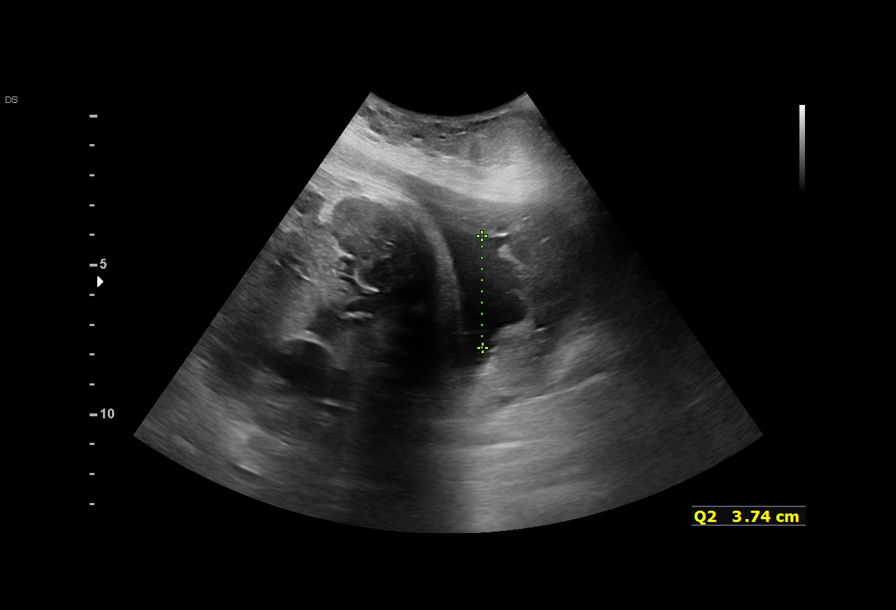
[im 6/15]
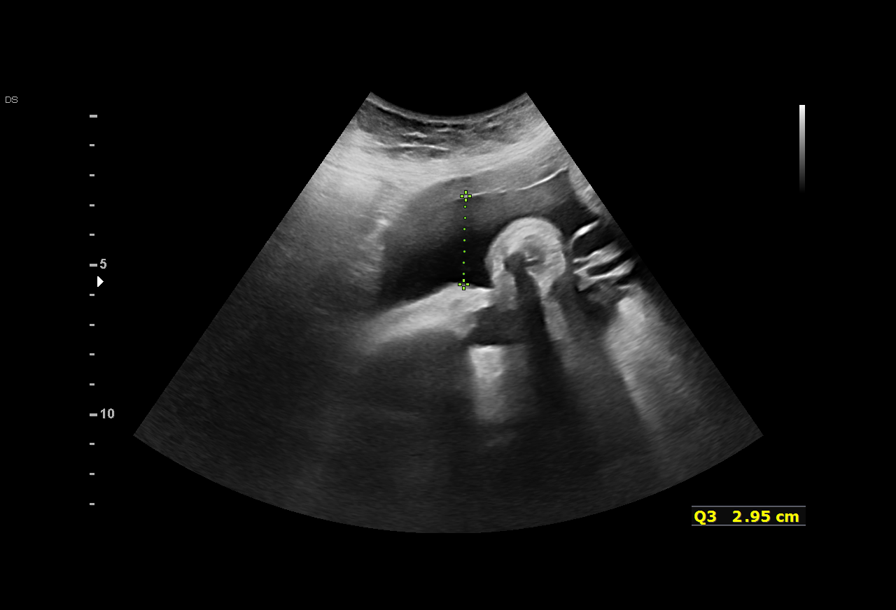
[im 7/15]
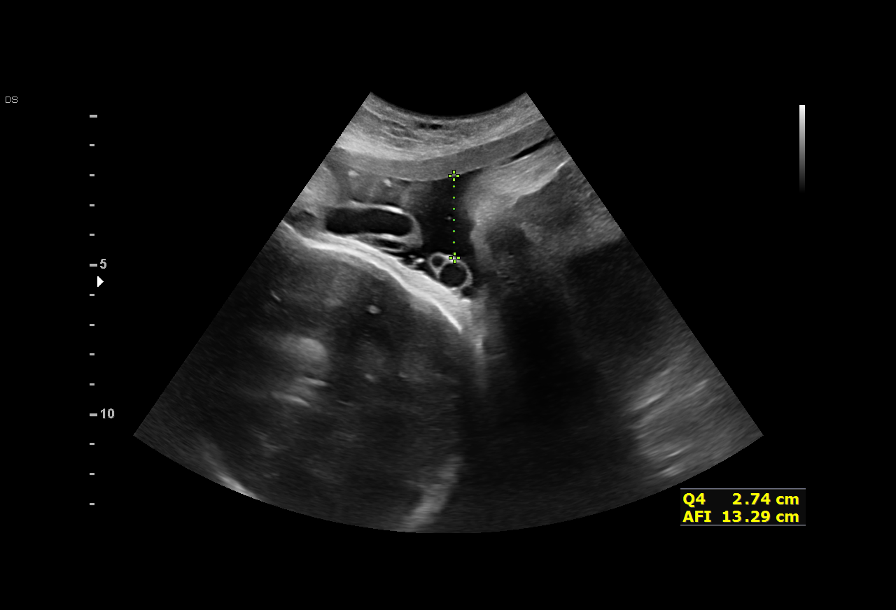
[im 8/15]
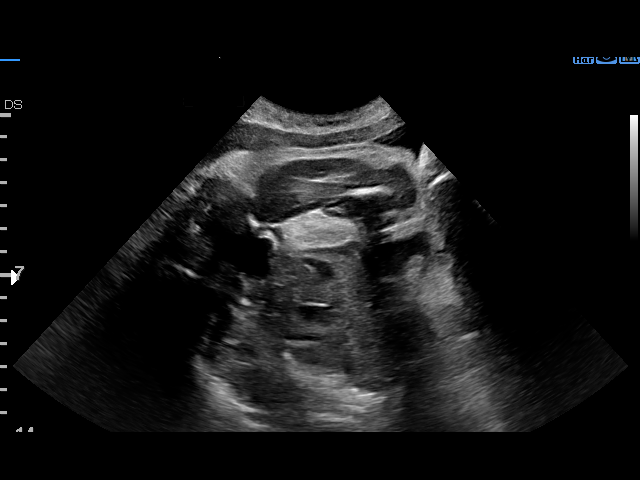
[im 9/15]
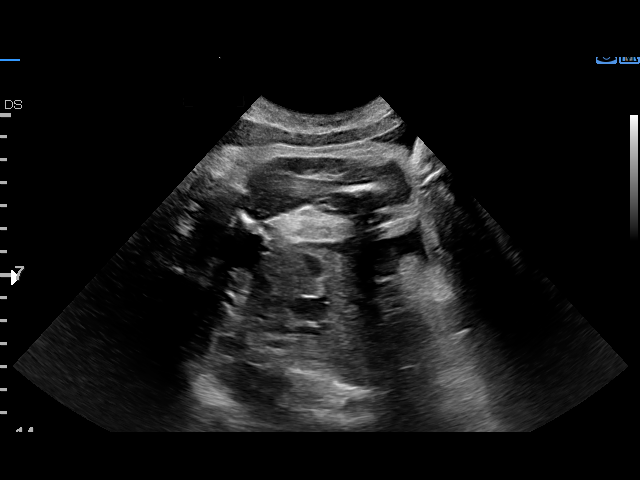
[im 10/15]
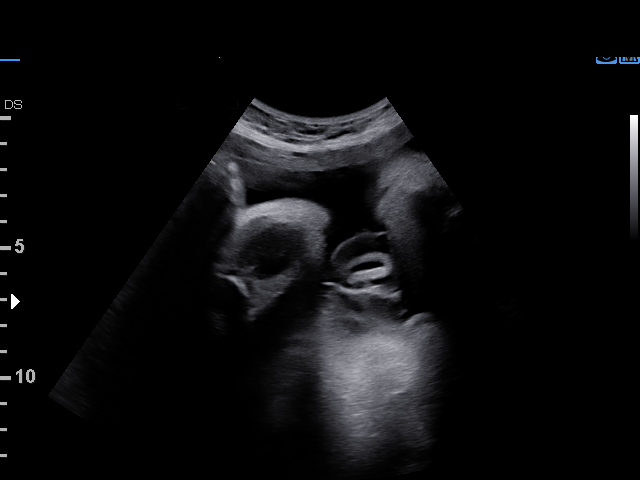
[im 11/15]
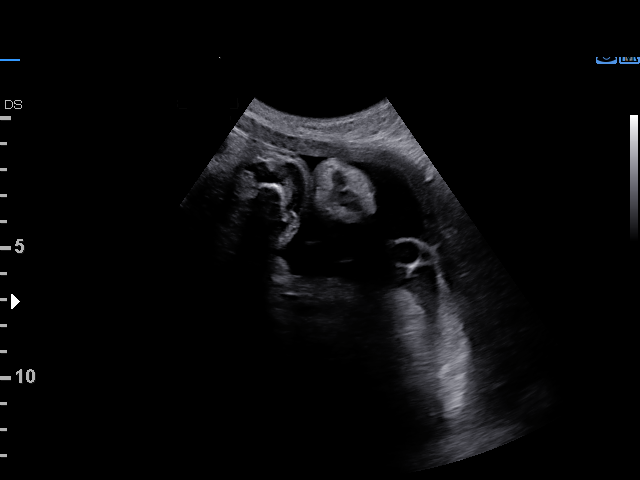
[im 13/15]
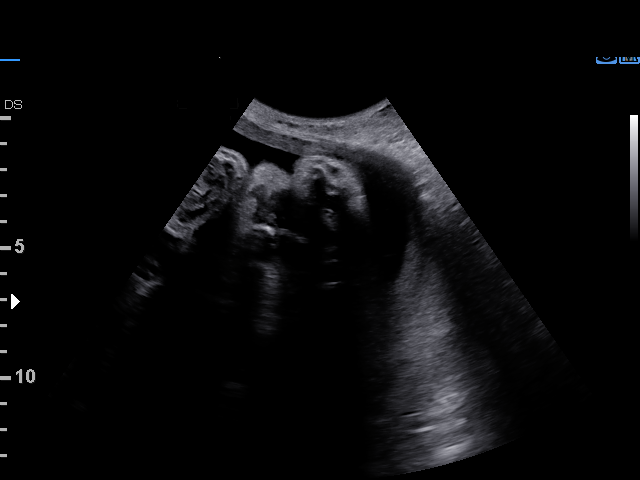
[im 14/15]
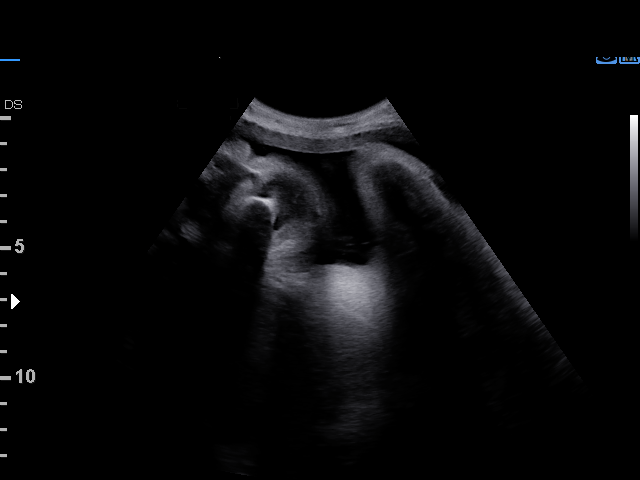
[im 15/15]
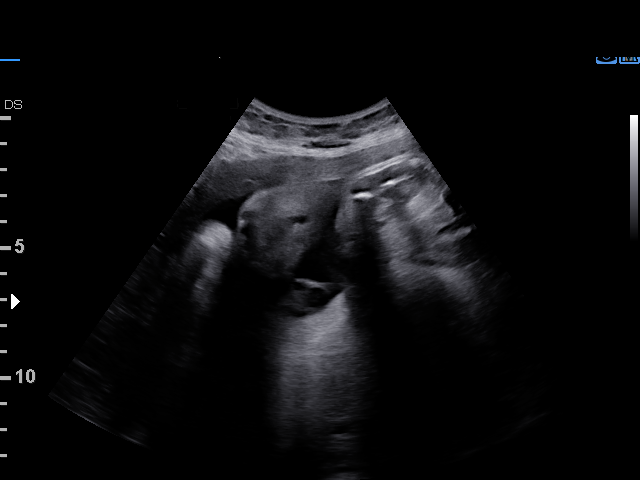

[13 of 15 positions shown; findings below may reference images not displayed]

Indications

 Pre-existing diabetes, type 2, in pregnancy,   [IK]
 second trimester
 Advanced maternal age multigravida 35+,        [IK]
 second trimester
 Hypertension - Chronic/Pre-existing            [IK]
 (labetalol)
 Obesity complicating pregnancy, second         [IK]
 trimester (BMI 43)
 Antiphospholipid syndrome complicating         O99.119, [IK]
 pregnancy, antepartum (lovenox)
 Hypothyroid                                    [IK] [IK]
 History of cesarean delivery, currently        [IK]
 pregnant
 Poor obstetric history: Previous               [IK]
 preeclampsia / eclampsia/gestational HTN
 35 weeks gestation of pregnancy
Vital Signs

                                                Height:        5'1"
Fetal Evaluation

 Num Of Fetuses:         1
 Cardiac Activity:       Observed
 Presentation:           Cephalic
 Placenta:               Fundal
 P. Cord Insertion:      Previously Visualized
 Amniotic Fluid
 AFI FV:      Within normal limits

 AFI Sum(cm)     %Tile       Largest Pocket(cm)
 13.29           46

 RUQ(cm)       RLQ(cm)       LUQ(cm)        LLQ(cm)

Biophysical Evaluation

 Amniotic F.V:   Pocket => 2 cm             F. Tone:        Observed
 F. Movement:    Observed                   Score:          [DATE]
 F. Breathing:   Observed
OB History

 Blood Type:   A+
 Maternal Racial/Ethnic Group:   White
 Gravidity:    5         Term:   1        Prem:   0        SAB:   3
 TOP:          0       Ectopic:  0        Living: 1
Gestational Age

 LMP:           35w 4d        Date:  [DATE]                 EDD:   [DATE]
 Best:          35w 4d     Det. By:  LMP  ([DATE])          EDD:   [DATE]
Cervix Uterus Adnexa

 Cervix
 Not visualized (advanced GA >[IK])
Comments

 This patient was seen for a biophysical profile due to chronic
 hypertension currently treated with Norvasc, pregestational
 diabetes that is treated with insulin, and the antiphospholipid
 antibody syndrome.  She denies any problems since her last
 exam and reports feeling fetal movements throughout the
 day.
 A biophysical profile performed today was [DATE].
 There was normal amniotic fluid noted on today's ultrasound
 exam.
 She will return in 1 week for another biophysical profile.  She
 reports that delivery has already been scheduled at around
 37 weeks.

## 2020-08-02 NOTE — MAU Note (Signed)
PT SAYS LOWER ABD PAIN  STARTED  AT 9PM.  WENT TO B-ROOM- WHEN WIPED HAD PINK BLOOD ON PAPER.   FELT NAUSEA, AND NEED FOR BM- BUT DID NOT - NONE NOW.  FEELS BABY MOVE. [facility] WITH  [place]  AND MFM

## 2020-08-03 ENCOUNTER — Inpatient Hospital Stay (HOSPITAL_BASED_OUTPATIENT_CLINIC_OR_DEPARTMENT_OTHER): Payer: 59

## 2020-08-03 DIAGNOSIS — O99213 Obesity complicating pregnancy, third trimester: Secondary | ICD-10-CM | POA: Diagnosis not present

## 2020-08-03 DIAGNOSIS — O99113 Other diseases of the blood and blood-forming organs and certain disorders involving the immune mechanism complicating pregnancy, third trimester: Secondary | ICD-10-CM

## 2020-08-03 DIAGNOSIS — Z3A35 35 weeks gestation of pregnancy: Secondary | ICD-10-CM | POA: Diagnosis not present

## 2020-08-03 DIAGNOSIS — O09293 Supervision of pregnancy with other poor reproductive or obstetric history, third trimester: Secondary | ICD-10-CM

## 2020-08-03 DIAGNOSIS — O09523 Supervision of elderly multigravida, third trimester: Secondary | ICD-10-CM | POA: Diagnosis not present

## 2020-08-03 DIAGNOSIS — O10013 Pre-existing essential hypertension complicating pregnancy, third trimester: Secondary | ICD-10-CM | POA: Diagnosis not present

## 2020-08-03 DIAGNOSIS — O4693 Antepartum hemorrhage, unspecified, third trimester: Secondary | ICD-10-CM | POA: Diagnosis not present

## 2020-08-03 DIAGNOSIS — E039 Hypothyroidism, unspecified: Secondary | ICD-10-CM

## 2020-08-03 DIAGNOSIS — D6861 Antiphospholipid syndrome: Secondary | ICD-10-CM

## 2020-08-03 DIAGNOSIS — E669 Obesity, unspecified: Secondary | ICD-10-CM

## 2020-08-03 DIAGNOSIS — R109 Unspecified abdominal pain: Secondary | ICD-10-CM | POA: Diagnosis not present

## 2020-08-03 DIAGNOSIS — O26893 Other specified pregnancy related conditions, third trimester: Secondary | ICD-10-CM | POA: Diagnosis not present

## 2020-08-03 DIAGNOSIS — O34219 Maternal care for unspecified type scar from previous cesarean delivery: Secondary | ICD-10-CM

## 2020-08-03 DIAGNOSIS — O99283 Endocrine, nutritional and metabolic diseases complicating pregnancy, third trimester: Secondary | ICD-10-CM

## 2020-08-03 LAB — CBC
HCT: 33 % — ABNORMAL LOW (ref 36.0–46.0)
Hemoglobin: 11.2 g/dL — ABNORMAL LOW (ref 12.0–15.0)
MCH: 30.9 pg (ref 26.0–34.0)
MCHC: 33.9 g/dL (ref 30.0–36.0)
MCV: 91.2 fL (ref 80.0–100.0)
Platelets: 223 10*3/uL (ref 150–400)
RBC: 3.62 MIL/uL — ABNORMAL LOW (ref 3.87–5.11)
RDW: 14.5 % (ref 11.5–15.5)
WBC: 12 10*3/uL — ABNORMAL HIGH (ref 4.0–10.5)
nRBC: 0 % (ref 0.0–0.2)

## 2020-08-03 LAB — COMPREHENSIVE METABOLIC PANEL
ALT: 12 U/L (ref 0–44)
AST: 13 U/L — ABNORMAL LOW (ref 15–41)
Albumin: 2.6 g/dL — ABNORMAL LOW (ref 3.5–5.0)
Alkaline Phosphatase: 66 U/L (ref 38–126)
Anion gap: 10 (ref 5–15)
BUN: 8 mg/dL (ref 6–20)
CO2: 20 mmol/L — ABNORMAL LOW (ref 22–32)
Calcium: 9.1 mg/dL (ref 8.9–10.3)
Chloride: 106 mmol/L (ref 98–111)
Creatinine, Ser: 0.51 mg/dL (ref 0.44–1.00)
GFR, Estimated: >60 mL/min (ref >60)
Glucose, Bld: 137 mg/dL — ABNORMAL HIGH (ref 70–99)
Potassium: 3.6 mmol/L (ref 3.5–5.1)
Sodium: 136 mmol/L (ref 135–145)
Total Bilirubin: <0.1 mg/dL — ABNORMAL LOW (ref 0.3–1.2)
Total Protein: 6.2 g/dL — ABNORMAL LOW (ref 6.5–8.1)

## 2020-08-03 LAB — PROTEIN / CREATININE RATIO, URINE
Creatinine, Urine: 18.28 mg/dL
Total Protein, Urine: <6 mg/dL

## 2020-08-03 LAB — WET PREP, GENITAL
Clue Cells Wet Prep HPF POC: NONE SEEN
Sperm: NONE SEEN
Trich, Wet Prep: NONE SEEN
Yeast Wet Prep HPF POC: NONE SEEN

## 2020-08-03 IMAGING — US US MFM OB LIMITED
1 series · 14 of 28 positions shown · non-contrast
Comparison: none

[Series 1: us mfm ob limited · 14 of 51 slices shown]
[im 2/51]
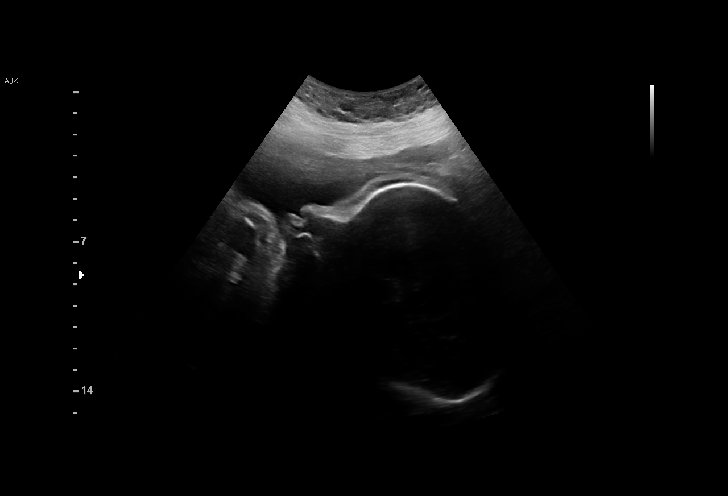
[im 6/51]
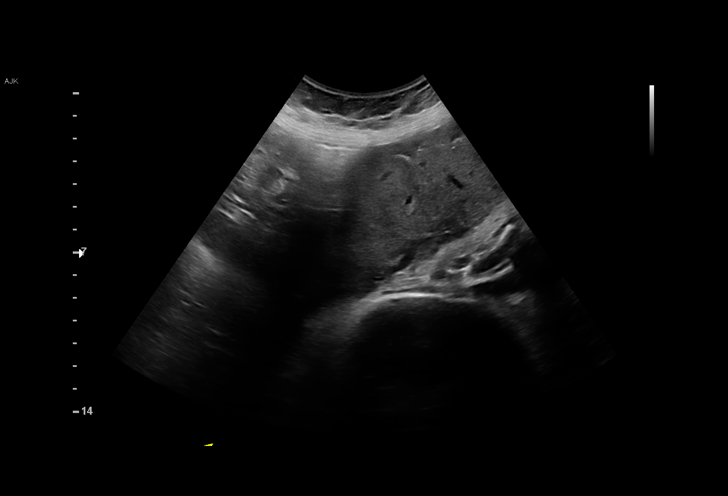
[im 10/51]
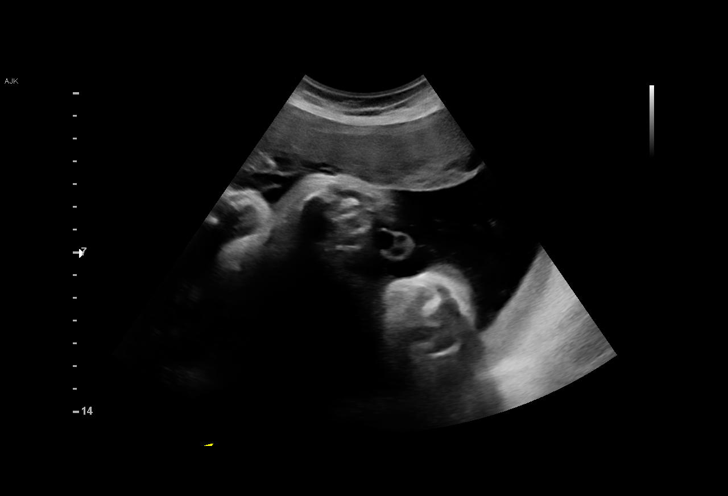
[im 13/51]
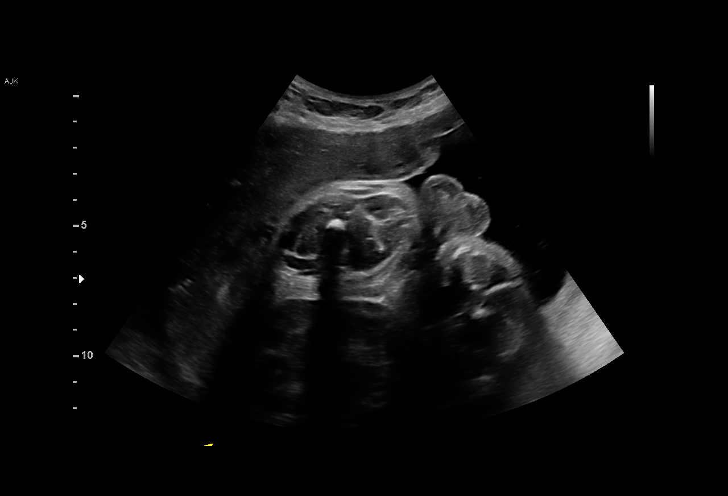
[im 17/51]
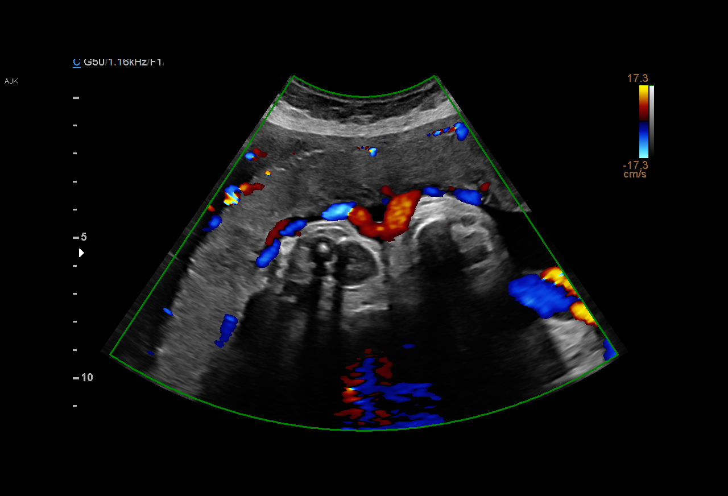
[im 21/51]
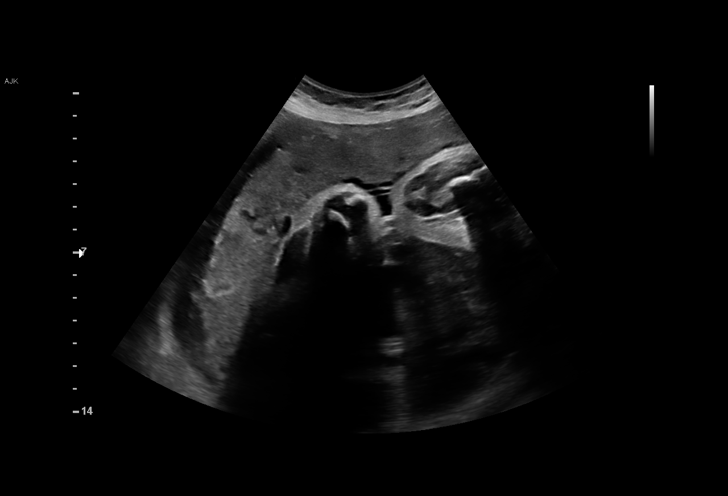
[im 25/51]
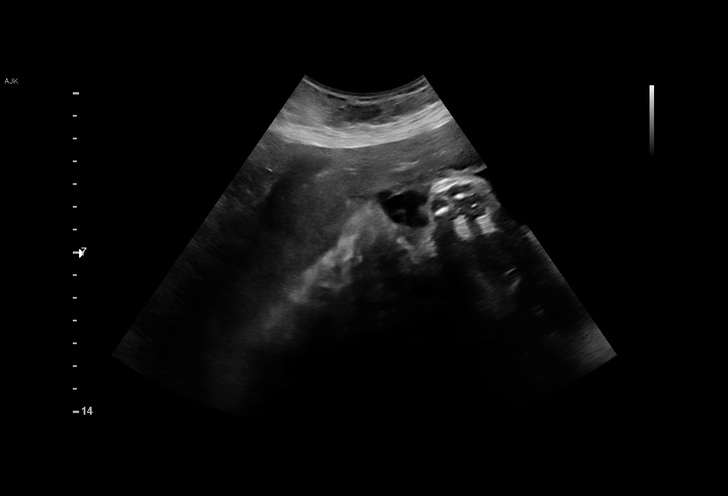
[im 28/51]
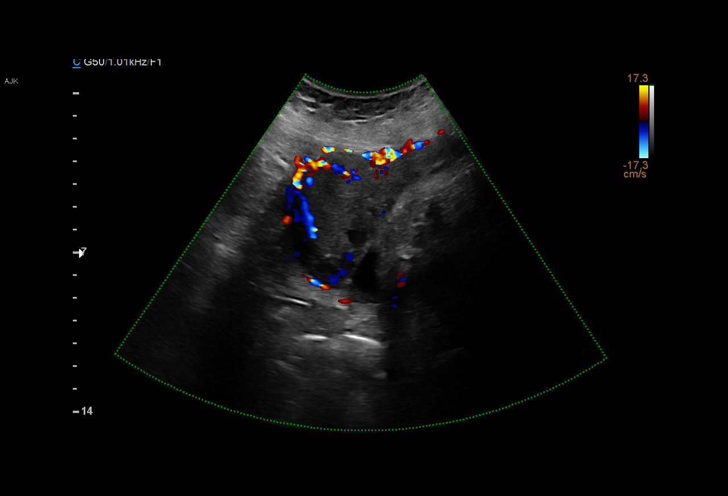
[im 32/51]
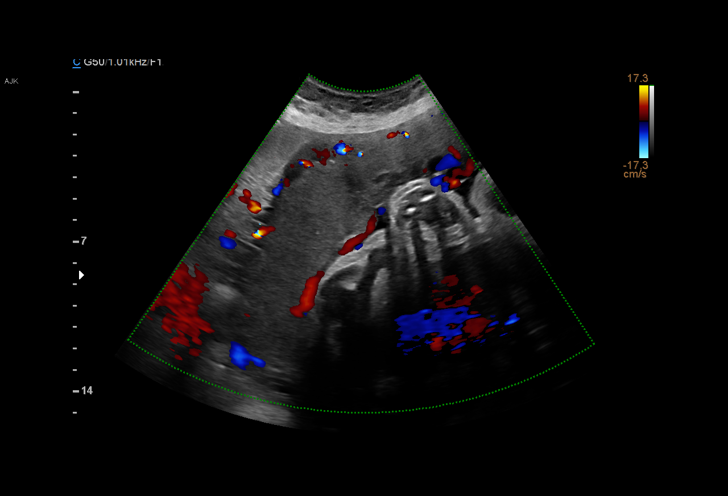
[im 36/51]
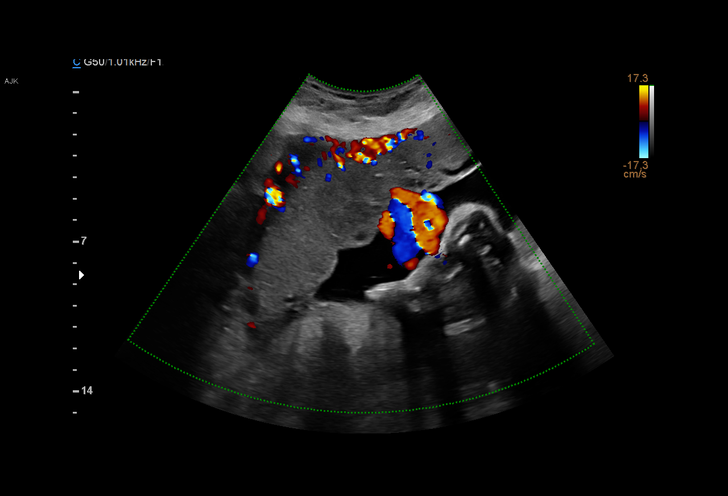
[im 39/51]
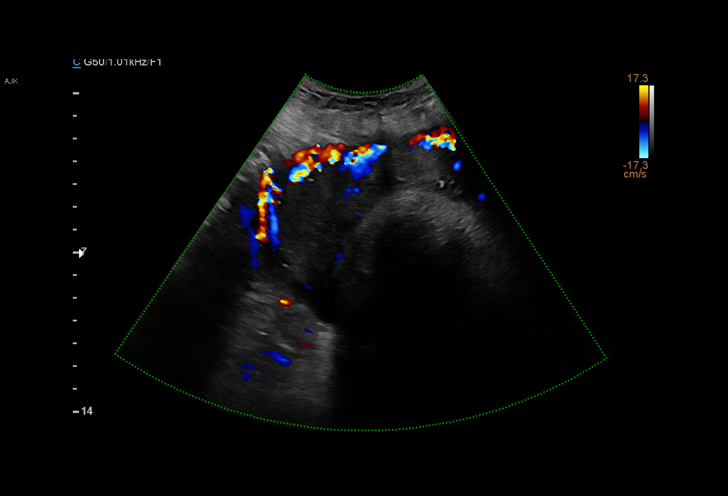
[im 43/51]
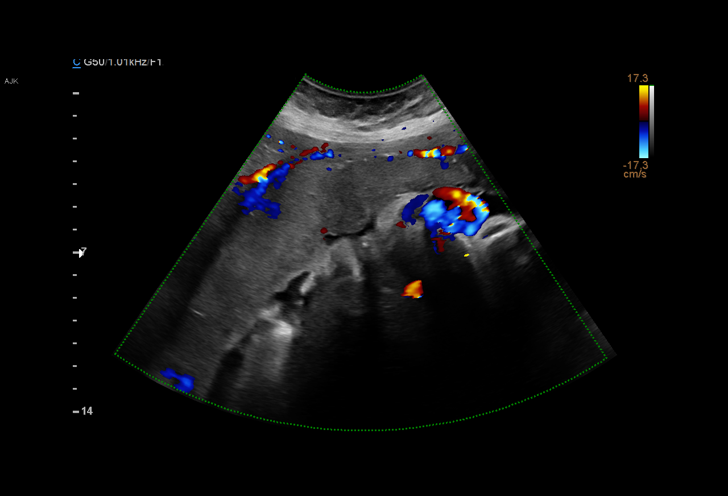
[im 47/51]
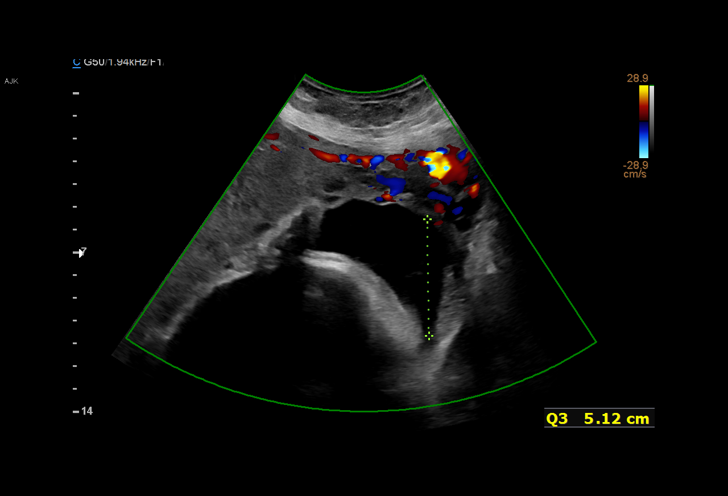
[im 51/51]
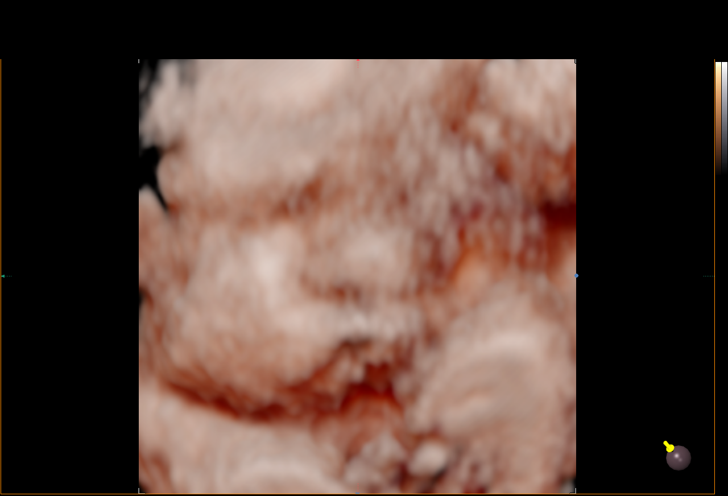

[14 of 28 positions shown; findings below may reference images not displayed]

1  US MFM OB LIMITED                     76815.01    [facility]

Indications

 35 weeks gestation of pregnancy
 Vaginal bleeding in pregnancy, third trimester  [BA]
 Pre-existing diabetes, type 2, in pregnancy,    [BA]
 second trimester
 Advanced maternal age multigravida 35+,         [BA]
 second trimester
 Hypertension - Chronic/Pre-existing             [BA]
 (labetalol)
 Obesity complicating pregnancy, second          [BA]
 trimester (BMI 43)
 Antiphospholipid syndrome complicating          O99.119, [BA]
 pregnancy, antepartum (lovenox)
 Hypothyroid                                     [BA] [BA]
 History of cesarean delivery, currently         [BA]
 pregnant
 Poor obstetric history: Previous                [BA]
 preeclampsia / eclampsia/gestational HTN
Vital Signs

                                                Height:        5'1"
Fetal Evaluation

 Num Of Fetuses:          1
 Fetal Heart Rate(bpm):   138
 Cardiac Activity:        Observed
 Presentation:            Cephalic (oblique right)
 Placenta:                Fundal
 P. Cord Insertion:       Visualized, central

 Amniotic Fluid
 AFI FV:      Within normal limits
 AFI Sum(cm)     %Tile       Largest Pocket(cm)
 17.2            64

 RUQ(cm)       RLQ(cm)       LUQ(cm)        LLQ(cm)


 Comment:    No placental abruption or previa identified.
OB History

 Blood Type:   A+
 Maternal Racial/Ethnic Group:   White
 Gravidity:    5         Term:   1        Prem:   0        SAB:   3
 TOP:          0       Ectopic:  0        Living: 1
Gestational Age

 LMP:           35w 5d        Date:  [DATE]                 EDD:   [DATE]
 Best:          35w 5d     Det. By:  LMP  ([DATE])          EDD:   [DATE]
Impression

 Patient was evaluated at the [facility] for c/o blood-stained
 mucus discharge and uterine cramping. She had reassuring
 antenatal testing yesterday at our office.
 A limited ultrasound study was performed .Amniotic fluid is
 normal and good fetal activity is seen . Cephalic presentation.
 Placenta appears normal with no evidence of previa or
 abruption.
                 [facility]

## 2020-08-03 NOTE — Discharge Instructions (Signed)
Vaginal Bleeding During Pregnancy, Third Trimester  A small amount of bleeding from the vagina (spotting) is relatively common during pregnancy. Various things can cause bleeding or spotting during pregnancy. Sometimes bleeding is normal and is not a problem. However, bleeding during the third trimester can also be a sign of something serious for the mother and the baby. Be sure to tell your health care provider about any vaginal bleeding right away. Some possible causes of vaginal bleeding during the third trimester include:  Infection or growths (polyps) on the cervix.  A condition in which the placenta partially or completely covers the opening of the cervix inside the uterus (placenta previa).  The placenta separating from the uterus (placenta abruption).  The start of labor (discharging of the mucus plug).  A condition in which the placenta grows into the muscle layer of the uterus (placenta accreta). Follow these instructions at home: Activity  Follow instructions from your health care provider about limiting your activity. If your health care provider recommends activity restriction, you may need to stay in bed and only get up to use the bathroom. In some cases, your health care provider may allow you to continue light activity.  If needed, make plans for someone to help with your regular activities.  Ask your health care provider if it is safe for you to drive.  Do not lift anything that is heavier than 10 lb (4.5 kg), or the limit that your health care provider tells you, until he or she says that it is safe.  Do not have sex or orgasms until your health care provider says that this is safe. Medicines  Take over-the-counter and prescription medicines only as told by your health care provider.  Do not take aspirin because it can cause bleeding. General instructions  Pay attention to any changes in your symptoms.  Write down how many pads you use each day, how often you  change pads, and how soaked (saturated) they are.  Do not use tampons or douche.  If you pass any tissue from your vagina, save the tissue so you can show it to your health care provider.  Keep all follow-up visits as told by your health care provider. This is important. Contact a health care provider if:  You have vaginal bleeding during any part of your pregnancy.  You have cramps or labor pains.  You have a fever. Get help right away if:  You have severe cramps or pain in your back or abdomen.  You have a gush of fluid from the vagina.  You pass large clots or a large amount of tissue from your vagina.  Your bleeding increases.  You feel light-headed or weak.  You faint.  You feel that your baby is moving less than usual, or not moving at all. Summary  Various things can cause bleeding or spotting in pregnancy.  Bleeding during the third trimester can be a sign of a serious problem for the mother and the baby.  Be sure to tell your health care provider about any vaginal bleeding right away. This information is not intended to replace advice given to you by your health care provider. Make sure you discuss any questions you have with your health care provider. Document Revised: 01/31/2019 Document Reviewed: 01/14/2017 Elsevier Patient Education  2020 [employer].        Placental Abruption  The placenta is the organ formed during pregnancy. It carries oxygen and nutrients to the unborn baby (fetus). The placenta is  the baby's life support system. In normal circumstances, it remains attached to the inside of the uterus until the baby is born. Placental abruption is a condition in which the placenta partly or completely separates from the uterus before the baby is born. Placental abruption is rare, but it can happen any time after 20 weeks of pregnancy. A small separation may not cause problems, but a large separation may be dangerous for you and your baby. A large  separation is usually an emergency that requires treatment right away. What are the causes? In most cases, the cause of this condition is not known. What increases the risk? This condition is more likely to develop in women who:  Have experienced a recent trauma such as a fall, an injury to the abdomen, or a car accident.  Have had a previous placental abruption.  Have high blood pressure.  Smoke cigarettes, use alcohol, or use drugs, such as cocaine.  Have multiples (twins, triplets, or more).  Have too much amniotic fluid (polyhydramnios).  Are 15 years of age or older. What are the signs or symptoms? Symptoms of this condition can be mild or severe. A small placental abruption may not cause symptoms, or it may cause mild symptoms, which may include:  Mild pain in the abdomen or lower back.  Slight bleeding in the vagina. A severe placental abruption will cause symptoms. The symptoms will depend on the size of the separation and the stage of pregnancy. They may include:  Severe pain in the abdomen or lower back.  Bleeding from the vagina.  Ongoing contractions of your uterus with little or no relaxation of your uterus between contractions. How is this diagnosed? There are no tests to diagnose placental abruption. However, your health care provider may suspect that you have this condition based on:  Your symptoms.  A physical exam.  Ultrasound findings.  Blood tests. How is this treated? Treatment for placental abruption depends on the severity of the condition.  Mild cases may be monitored through close observation. You may be admitted to the hospital during this time.  Severe cases may require emergency treatment. This may involve: ? Admission to the hospital. ? Emergency cesarean delivery of your baby. ? A blood transfusion or other fluids given through an IV. Follow these instructions at home: Activity  Get plenty of rest and sleep.  Do not have sex until  your health care provider says it is okay. Lifestyle  Do not use drugs.  Do not drink alcohol.  Do not use tampons or douche unless your health care provider says it is okay.  Do not use any products that contain nicotine or tobacco, such as cigarettes, e-cigarettes, and chewing tobacco. If you need help quitting, ask your health care provider. General instructions  Take over-the-counter and prescription medicines only as told by your health care provider.  Do not take any medicines that your health care provider has not approved.  Arrange for help at home so you can get adequate rest.  Keep all follow-up visits as told by your health care provider. This is important. Contact a health care provider if:  You have vaginal spotting.  You are having mild, regular contractions. Get help right away if:  You have moderate or heavy vaginal bleeding or spotting.  You have severe pain in the abdomen.  You have continuous uterine contractions.  You have a hard, tender uterus.  You have any type of trauma, such as an injury to the abdomen, a  fall, or a car accident.  You do not feel the baby move, or the baby moves very little. Summary  Placental abruption is a condition in which the placenta partly or completely separates from the uterus before the baby is born.  Placental abruption is a medical emergency that requires treatment right away.  Contact a health care provider if you have vaginal bleeding, or if you have mild, regular contractions.  Get help right away if you have heavy vaginal bleeding, severe pain in the abdomen, continuous uterine contractions, or you do not feel the baby move.  Keep all follow-up visits as told by your health care provider. This is important. This information is not intended to replace advice given to you by your health care provider. Make sure you discuss any questions you have with your health care provider. Document Revised: 04/06/2019  Document Reviewed: 04/06/2019 Elsevier Patient Education  2020 [employer]        Preterm Labor and Birth Information  The normal length of a pregnancy is 39-41 weeks. Preterm labor is when labor starts before 37 completed weeks of pregnancy. What are the risk factors for preterm labor? Preterm labor is more likely to occur in women who:  Have certain infections during pregnancy such as a bladder infection, sexually transmitted infection, or infection inside the uterus (chorioamnionitis).  Have a shorter-than-normal cervix.  Have gone into preterm labor before.  Have had surgery on their cervix.  Are younger than age 41 or older than age 41.  Are African American.  Are pregnant with twins or multiple babies (multiple gestation).  Take street drugs or smoke while pregnant.  Do not gain enough weight while pregnant.  Became pregnant shortly after having been pregnant. What are the symptoms of preterm labor? Symptoms of preterm labor include:  Cramps similar to those that can happen during a menstrual period. The cramps may happen with diarrhea.  Pain in the abdomen or lower back.  Regular uterine contractions that may feel like tightening of the abdomen.  A feeling of increased pressure in the pelvis.  Increased watery or bloody mucus discharge from the vagina.  Water breaking (ruptured amniotic sac). Why is it important to recognize signs of preterm labor? It is important to recognize signs of preterm labor because babies who are born prematurely may not be fully developed. This can put them at an increased risk for:  Long-term (chronic) heart and lung problems.  Difficulty immediately after birth with regulating body systems, including blood sugar, body temperature, heart rate, and breathing rate.  Bleeding in the brain.  Cerebral palsy.  Learning difficulties.  Death. These risks are highest for babies who are born before 34 weeks of pregnancy. How  is preterm labor treated? Treatment depends on the length of your pregnancy, your condition, and the health of your baby. It may involve:  Having a stitch (suture) placed in your cervix to prevent your cervix from opening too early (cerclage).  Taking or being given medicines, such as: ? Hormone medicines. These may be given early in pregnancy to help support the pregnancy. ? Medicine to stop contractions. ? Medicines to help mature the babys lungs. These may be prescribed if the risk of delivery is high. ? Medicines to prevent your baby from developing cerebral palsy. If the labor happens before 34 weeks of pregnancy, you may need to stay in the hospital. What should I do if I think I am in preterm labor? If you think that you are going  into preterm labor, call your health care provider right away. How can I prevent preterm labor in future pregnancies? To increase your chance of having a full-term pregnancy:  Do not use any tobacco products, such as cigarettes, chewing tobacco, and e-cigarettes. If you need help quitting, ask your health care provider.  Do not use street drugs or medicines that have not been prescribed to you during your pregnancy.  Talk with your health care provider before taking any herbal supplements, even if you have been taking them regularly.  Make sure you gain a healthy amount of weight during your pregnancy.  Watch for infection. If you think that you might have an infection, get it checked right away.  Make sure to tell your health care provider if you have gone into preterm labor before. This information is not intended to replace advice given to you by your health care provider. Make sure you discuss any questions you have with your health care provider. Document Revised: 02/03/2019 Document Reviewed: 03/04/2016 Elsevier Patient Education  2020 [employer].

## 2020-08-03 NOTE — MAU Provider Note (Signed)
History     CSN: [id number]  Arrival date and time: 08/02/20 2254   First Provider Initiated Contact with Patient 08/03/20 0018      Chief Complaint  Patient presents with  . Vaginal Bleeding  . Abdominal Pain   Ms. Kelli Miller is a 39 y.o. [phone] at [redacted]w[redacted]d who presents to MAU for vaginal bleeding which began around 9PM this evening. Patient went to bathroom to urinate and after she finished she experienced cramping for about 15 minutes in pelvis. After using bathroom in MAU, pt noticed blood mixed with clear mucus on toilet paper. Pt's sister present for entire visit.  Passing blood clots? no Blood soaking clothes? no Lightheaded/dizzy? no Significant pelvic pain or cramping/ctx? cramping Passed any tissue? no Recent trauma? no Prior abruption? no Smoking/drug use? no HTN? cHTN PPROM? no Current pregnancy problems? Pre-existing DM, hypothyroid, cHTN, APL+, GERD Hx of C/S or GYN surgery? Hx C/S x1  Blood Type? AB Positive Allergies? Bactrim Current medications? Labetalol, norvasc, prilosec, progesterone, NPH, novolog, metformin, synthroid, lovenox, low dose ASA Current PNC & next appt? [place], 08/08/2020   OB History    Gravida  5   Para  1   Term  1   Preterm      AB  3   Living  1     SAB  3   TAB      Ectopic      Multiple  0   Live Births  1           Past Medical History:  Diagnosis Date  . Antiphospholipid antibody syndrome (HCC)    per pt dx when had hx recurrent miscarriage's  . GERD (gastroesophageal reflux disease)   . History of trichomoniasis 2019  . Hypertension    followed by pcp  . Hypothyroidism    followed by   . PCOS (polycystic ovarian syndrome) 2015  . Psoriasis    dermatologist--- dr Rickard [facility])  . Retained intrauterine contraceptive device (IUD)   . Type 2 diabetes mellitus (HCC)    followed by pcp    Past Surgical History:  Procedure Laterality Date  . CESAREAN SECTION N/A 06/23/2018   Procedure:  CESAREAN SECTION;  Surgeon: Tereso Newcomer, MD;  Location: [facility];  Service: Obstetrics;  Laterality: N/A;  . DILATION AND EVACUATION N/A 04/05/2014   Procedure: DILATATION AND EVACUATION with genetic studies;  Surgeon: Mitchel Honour, DO;  Location: [facility];  Service: Gynecology;  Laterality: N/A;  . IUD REMOVAL N/A 08/15/2019   Procedure: INTRAUTERINE DEVICE (IUD) REMOVAL;  Surgeon: Allie Bossier, MD;  Location: [facility];  Service: Gynecology;  Laterality: N/A;    Family History  Problem Relation Age of Onset  . Lung cancer Father   . Lung cancer Mother   . Heart attack Mother   . Hypertension Mother     Social History   Tobacco Use  . Smoking status: Former Smoker    Years: 10.00    Types: Cigarettes    Quit date: 10/03/2013    Years since quitting: 6.8  . Smokeless tobacco: Never Used  Vaping Use  . Vaping Use: Never used  Substance Use Topics  . Alcohol use: No  . Drug use: Never    Allergies:  Allergies  Allergen Reactions  . Bactrim [Sulfamethoxazole-Trimethoprim] Rash    Medications Prior to Admission  Medication Sig Dispense Refill Last Dose  . acetaminophen (TYLENOL) 500 MG tablet Take 1,000 mg by mouth every  6 (six) hours as needed for mild pain or moderate pain.   08/02/2020 at 0800  . amLODipine (NORVASC) 10 MG tablet TAKE ONE TABLET BY MOUTH DAILY (Patient taking differently: Take 10 mg by mouth at bedtime. ) 90 tablet 1 08/01/2020 at 2200  . aspirin EC 81 MG tablet Take 81 mg by mouth at bedtime.    08/01/2020 at 2200  . Clobetasol Prop Emollient Base 0.05 % emollient cream Apply 1 application topically daily as needed (Psoriasis).    Past Week at Unknown time  . cyanocobalamin (,VITAMIN B-12,) 1000 MCG/ML injection Inject 1 mL (1,000 mcg total) into the muscle every 30 (thirty) days. 1 mL 5 Past Month at Unknown time  . EUTHYROX 50 MCG tablet TAKE 1 TABLET BY MOUTH ONCE DAILY BEFORE BREAKFAST (Patient taking differently: Take 50 mcg  by mouth daily before breakfast. ) 90 tablet 1 08/02/2020 at Unknown time  . insulin aspart (NOVOLOG) 100 UNIT/ML injection 9 units in AM and 9 units @ bedtime (Patient taking differently: Inject 7-13 Units into the skin See admin instructions. Take 11 units in the morning, 7 units at lunch and 13 units at bedtime) 10 mL 12 08/02/2020 at 1300  . insulin NPH Human (NOVOLIN N) 100 UNIT/ML injection Inject 0.5 mLs (50 Units total) into the skin 2 (two) times daily before a meal. Take 50 units subcutaneously with breakfast and 54 units at bedtime (Patient taking differently: Inject 66 Units into the skin See admin instructions. Take 66 units subcutaneously with breakfast and 96 units at bedtime) 20 mL 3 08/02/2020 at 1000  . labetalol (NORMODYNE) 200 MG tablet Take 1 tablet (200 mg total) by mouth 2 (two) times daily. 180 tablet 3 08/02/2020 at 0800  . metFORMIN (GLUCOPHAGE) 1000 MG tablet Take 1 tablet (1,000 mg total) by mouth 2 (two) times daily with a meal. 180 tablet 1 08/02/2020 at 0800  . omeprazole (PRILOSEC) 20 MG capsule Take 1 capsule (20 mg total) by mouth daily. (Patient taking differently: Take 20 mg by mouth at bedtime. ) 90 capsule 3 08/01/2020 at 2200  . Prenatal Vit-Fe Fumarate-FA (PRENATAL VITAMIN PO) Take 1 tablet by mouth at bedtime.    08/01/2020 at 2200  . progesterone (PROMETRIUM) 200 MG capsule Take 1 capsule (200 mg total) by mouth daily. (Patient taking differently: Take 200 mg by mouth at bedtime. ) 30 capsule 3 08/01/2020 at 2200  . Clobetasol Propionate 0.05 % shampoo Apply 1 application topically daily as needed (Psoriasis).      . enoxaparin (LOVENOX) 100 MG/ML injection Inject 1 mL (100 mg total) into the skin daily. (Patient taking differently: Inject 100 mg into the skin at bedtime. ) 30 mL 2 08/01/2020 at 2200  . STELARA 90 MG/ML SOSY injection Inject 90 mg into the skin See admin instructions. EVERY 12 WEEKS   More than a month at Unknown time    Review of Systems   Constitutional: Negative for chills, diaphoresis, fatigue and fever.  Eyes: Negative for visual disturbance.  Respiratory: Negative for shortness of breath.   Cardiovascular: Negative for chest pain.  Gastrointestinal: Negative for abdominal pain, constipation, diarrhea, nausea and vomiting.  Genitourinary: Positive for pelvic pain and vaginal bleeding. Negative for dysuria, flank pain, frequency, urgency and vaginal discharge.  Neurological: Negative for dizziness, weakness, light-headedness and headaches.   Physical Exam   Blood pressure (!) 142/75, pulse 89, temperature 98.8 F (37.1 C), temperature source Oral, resp. rate 20, height 5\' 1"  (1.549 m), weight 118.5 kg,  last menstrual period 11/27/2019.  Patient Vitals for the past 24 hrs:  BP Temp Temp src Pulse Resp Height Weight  08/02/20 2312 (!) 142/75 98.8 F (37.1 C) Oral 89 20  (1.549 m) 118.5 kg   Physical Exam Vitals and nursing note reviewed. Exam conducted with a chaperone present.  Constitutional:      General: She is not in acute distress.    Appearance: Normal appearance. She is not ill-appearing, toxic-appearing or diaphoretic.  HENT:     Head: Normocephalic and atraumatic.  Pulmonary:     Effort: Pulmonary effort is normal.  Abdominal:     Palpations: Abdomen is soft.  Genitourinary:    General: Normal vulva.     Labia:        Right: No rash, tenderness, lesion or injury.        Left: No rash, tenderness, lesion or injury.      Vagina: No vaginal discharge, tenderness, bleeding or lesions.     Cervix: Discharge (minimal clear-yellow mucus mixed with bright red blood coming from external os, cervix visually closed, no active bleeding noted) and cervical bleeding present. No friability.  Neurological:     Mental Status: She is alert and oriented to person, place, and time.  Psychiatric:        Mood and Affect: Mood normal.        Behavior: Behavior normal.        Thought Content: Thought content normal.         Judgment: Judgment normal.    Results for orders placed or performed during the hospital encounter of 08/02/20 (from the past 24 hour(s))  Urinalysis, Routine w reflex microscopic Urine, Clean Catch     Status: Abnormal   Collection Time: 08/02/20 11:29 PM  Result Value Ref Range   Color, Urine STRAW (A) YELLOW   APPearance CLEAR CLEAR   Specific Gravity, Urine 1.004 (L) 1.005 - 1.030   pH 6.0 5.0 - 8.0   Glucose, UA NEGATIVE NEGATIVE mg/dL   Hgb urine dipstick LARGE (A) NEGATIVE   Bilirubin Urine NEGATIVE NEGATIVE   Ketones, ur NEGATIVE NEGATIVE mg/dL   Protein, ur NEGATIVE NEGATIVE mg/dL   Nitrite NEGATIVE NEGATIVE   Leukocytes,Ua NEGATIVE NEGATIVE   RBC / HPF 0-5 0 - 5 RBC/hpf   WBC, UA 0-5 0 - 5 WBC/hpf   Bacteria, UA RARE (A) NONE SEEN   Squamous Epithelial / LPF 0-5 0 - 5   Mucus PRESENT   Wet prep, genital     Status: Abnormal   Collection Time: 08/03/20 12:30 AM  Result Value Ref Range   Yeast Wet Prep HPF POC NONE SEEN NONE SEEN   Trich, Wet Prep NONE SEEN NONE SEEN   Clue Cells Wet Prep HPF POC NONE SEEN NONE SEEN   WBC, Wet Prep HPF POC FEW (A) NONE SEEN   Sperm NONE SEEN   Protein / creatinine ratio, urine     Status: None   Collection Time: 08/03/20 12:47 AM  Result Value Ref Range   Creatinine, Urine 18.28 mg/dL   Total Protein, Urine <6 mg/dL   Protein Creatinine Ratio        0.00 - 0.15 mg/mg[Cre]  CBC     Status: Abnormal   Collection Time: 08/03/20 12:50 AM  Result Value Ref Range   WBC 12.0 (H) 4.0 - 10.5 K/uL   RBC 3.62 (L) 3.87 - 5.11 MIL/uL   Hemoglobin 11.2 (L) 12.0 - 15.0 g/dL   HCT 16.1 (  L) 36 - 46 %   MCV 91.2 80.0 - 100.0 fL   MCH 30.9 26.0 - 34.0 pg   MCHC 33.9 30.0 - 36.0 g/dL   RDW 16.1 09.6 - 04.5 %   Platelets 223 150 - 400 K/uL   nRBC 0.0 0.0 - 0.2 %  Comprehensive metabolic panel     Status: Abnormal   Collection Time: 08/03/20 12:50 AM  Result Value Ref Range   Sodium 136 135 - 145 mmol/L   Potassium 3.6 3.5 - 5.1 mmol/L    Chloride 106 98 - 111 mmol/L   CO2 20 (L) 22 - 32 mmol/L   Glucose, Bld 137 (H) 70 - 99 mg/dL   BUN 8 6 - 20 mg/dL   Creatinine, Ser 4.09 0.44 - 1.00 mg/dL   Calcium 9.1 8.9 - 81.1 mg/dL   Total Protein 6.2 (L) 6.5 - 8.1 g/dL   Albumin 2.6 (L) 3.5 - 5.0 g/dL   AST 13 (L) 15 - 41 U/L   ALT 12 0 - 44 U/L   Alkaline Phosphatase 66 38 - 126 U/L   Total Bilirubin <0.1 (L) 0.3 - 1.2 mg/dL   GFR, Estimated >91 >47 mL/min   Anion gap 10 5 - 15   [place] MFM FETAL BPP WO NON STRESS  Result Date: 08/02/2020 ----------------------------------------------------------------------  OBSTETRICS REPORT                       (Signed Final 08/02/2020 10:21 am) ---------------------------------------------------------------------- Patient Info  ID #:       [id number]                          D.O.B.:  07-Mar-1981 (39 yrs)  Name:       Kelli Miller                  Visit Date: 08/02/2020 09:44 am ---------------------------------------------------------------------- Performed By  Attending:        Ma Rings MD         Ref. Address:     [address], [place]  Performed By:     Truitt Leep,      Location[facility]                                [facility]                                                             [facility]  Referred By:      Everardo All ---------------------------------------------------------------------- Orders  #  Description                           Code        Ordered By  1  [place] MFM FETAL BPP WO NON               76819.01    RAVI [facility]     STRESS ----------------------------------------------------------------------  #  Order #                     Accession #                Episode #  1  [id number]                   [id number]                 [id number]  ---------------------------------------------------------------------- Indications  Pre-existing diabetes, type 2, in pregnancy,   O24.112  second trimester  Advanced maternal age multigravida 52+,        O15.522  second trimester  Hypertension - Chronic/Pre-existing            O10.019  (labetalol)  Obesity complicating pregnancy, second         O99.212  trimester (BMI 43)  Antiphospholipid syndrome complicating         O99.119, D68.61  pregnancy, antepartum (lovenox)  Hypothyroid                                    O99.280 E03.9  History of cesarean delivery, currently        O34.219  pregnant  Poor obstetric history: Previous               O09.299  preeclampsia / eclampsia/gestational HTN  [redacted] weeks gestation of pregnancy                Z3A.35 ---------------------------------------------------------------------- Vital Signs                                                 Height:        5'1" ---------------------------------------------------------------------- Fetal Evaluation  Num Of Fetuses:         1  Cardiac Activity:       Observed  Presentation:           Cephalic  Placenta:               Fundal  P. Cord Insertion:      Previously Visualized  Amniotic Fluid  AFI FV:      Within normal limits  AFI Sum(cm)     %Tile       Largest Pocket(cm)  13.29           46          3.86  RUQ(cm)       RLQ(cm)       LUQ(cm)        LLQ(cm)  3.86          2.74          3.74           2.95 ---------------------------------------------------------------------- Biophysical  Evaluation  Amniotic F.V:   Pocket => 2 cm             F. Tone:        Observed  F. Movement:    Observed                   Score:          8/8  F. Breathing:   Observed ---------------------------------------------------------------------- OB History  Blood Type:   A+  Maternal Racial/Ethnic Group:   White  Gravidity:    5         Term:   1        Prem:   0        SAB:   3  TOP:          0       Ectopic:  0        Living: 1  ---------------------------------------------------------------------- Gestational Age  LMP:           35w 4d        Date:  11/27/19                 EDD:   09/02/20  Best:          Consuello Closs 4d     Det. By:  LMP  (11/27/19)          EDD:   09/02/20 ---------------------------------------------------------------------- Cervix Uterus Adnexa  Cervix  Not visualized (advanced GA >24wks) ---------------------------------------------------------------------- Comments  This patient was seen for a biophysical profile due to chronic  hypertension currently treated with Norvasc, pregestational  diabetes that is treated with insulin, and the antiphospholipid  antibody syndrome.  She denies any problems since her last  exam and reports feeling fetal movements throughout the  day.  A biophysical profile performed today was 8 out of 8.  There was normal amniotic fluid noted on today's ultrasound  exam.  She will return in 1 week for another biophysical profile.  She  reports that delivery has already been scheduled at around  37 weeks. ----------------------------------------------------------------------                   Ma Rings, MD Electronically Signed Final Report   08/02/2020 10:21 am ----------------------------------------------------------------------  [place] MFM FETAL BPP WO NON STRESS  Result Date: 07/25/2020 ----------------------------------------------------------------------  OBSTETRICS REPORT                       (Signed Final 07/25/2020 04:59 pm) ---------------------------------------------------------------------- Patient Info  ID #:       [id number]                          D.O.B.:  November 07, 1980 (39 yrs)  Name:       Kelli Miller                  Visit Date: 07/25/2020 04:14 pm ---------------------------------------------------------------------- Performed By  Attending:        Noralee Space MD        Ref. Address:     [address]  [place], [place]   Performed By:     Truitt Leep,      Location:         [facility]                    RD[facility]                                [facility]  Referred By:      [facility] ---------------------------------------------------------------------- Orders  #  Description                           Code        Ordered By  1  [place] MFM FETAL BPP WO NON               76819.01    RAVI SHANKAR     STRESS  2  [place] MFM OB FOLLOW UP                   76816.01    RAVI [facility] ----------------------------------------------------------------------  #  Order #                     Accession #                Episode #  1  [id number]                   [id number]                 [id number]  2  [id number]                   [id number]                 [id number] ---------------------------------------------------------------------- Indications  Pre-existing diabetes, type 2, in pregnancy,   O24.112  second trimester  Advanced maternal age multigravida 89+,        O15.522  second trimester  Hypertension - Chronic/Pre-existing            O10.019  (labetalol)  Obesity complicating pregnancy, second         O99.212  trimester (BMI 43)  Antiphospholipid syndrome complicating         O99.119, D68.61  pregnancy, antepartum (lovenox)  Hypothyroid                                    O99.280 E03.9  History of cesarean delivery, currently        O34.219  pregnant  Poor obstetric history: Previous  O09.299  preeclampsia / eclampsia/gestational HTN  [redacted] weeks gestation of pregnancy                Z3A.34 ---------------------------------------------------------------------- Vital Signs                                                 Height:        5'1" ---------------------------------------------------------------------- Fetal Evaluation  Num Of Fetuses:         1  Cardiac Activity:       Observed   Presentation:           Transverse, HMR (unstbale lie)  Placenta:               Fundal  P. Cord Insertion:      Previously Visualized  Amniotic Fluid  AFI FV:      Within normal limits  AFI Sum(cm)     %Tile       Largest Pocket(cm)  9.9             19          4.3  RUQ(cm)       RLQ(cm)       LUQ(cm)        LLQ(cm)  3.6           0             2              4.3 ---------------------------------------------------------------------- Biophysical Evaluation  Amniotic F.V:   Pocket => 2 cm             F. Tone:        Observed  F. Movement:    Observed                   Score:          8/8  F. Breathing:   Observed ---------------------------------------------------------------------- Biometry  BPD:      88.3  mm     G. Age:  35w 5d         83  %    CI:        79.05   %    70 - 86                                                          FL/HC:      21.3   %    19.4 - 21.8  HC:       314   mm     G. Age:  35w 1d         33  %    HC/AC:      1.02        0.96 - 1.11  AC:      306.8  mm     G. Age:  34w 4d         61  %    FL/BPD:     75.9   %    71 - 87  FL:         67  mm     G. Age:  34w 3d         42  %    FL/AC:      21.8   %    20 - 24  Est. FW:    2506  gm      5 lb 8 oz     54  % ---------------------------------------------------------------------- OB History  Blood Type:   A+  Maternal Racial/Ethnic Group:   White  Gravidity:    5         Term:   1        Prem:   0        SAB:   3  TOP:          0       Ectopic:  0        Living: 1 ---------------------------------------------------------------------- Gestational Age  LMP:           34w 3d        Date:  11/27/19                 EDD:   09/02/20  U/S Today:     35w 0d                                        EDD:   08/29/20  Best:          34w 3d     Det. By:  LMP  (11/27/19)          EDD:   09/02/20 ---------------------------------------------------------------------- Anatomy  Cranium:               Appears normal         RVOT:                   Appears normal   Cavum:                 Appears normal         LVOT:                   Appears normal  Face:                  Appears normal         Diaphragm:              Appears normal                         (orbits and profile)  Lips:                  Appears normal         Stomach:                Appears normal, left                                                                        sided  Thoracic:              Appears normal         Kidneys:  Appear normal  Heart:                 Appears normal         Bladder:                Appears normal                         (4CH, axis, and                         situs)  Other:  Other anatomy previously iamges and appeared normal. ---------------------------------------------------------------------- Cervix Uterus Adnexa  Cervix  Not visualized (advanced GA >24wks) ---------------------------------------------------------------------- Impression  Chronic hypertension.  Patient takes labetalol 200 mg twice  daily.  Blood pressures today at our office was 126/63 mm  Hg.  Diabetes: Patient takes insulin NPH 90 units at night and 60  units in the morning and NovoLog 07/02/10 units with meals.  She had increased insulin dosage as advised by her  endocrinologist.  Fetal growth is appropriate for gestational age .Amniotic fluid  is normal and good fetal activity is seen .Antenatal testing is  reassuring. BPP 8/8.  I reassured the patient of the findings.  We discussed timing of delivery.  If diabetes is suboptimally  controlled, I recommend delivery at [redacted] weeks gestation. ---------------------------------------------------------------------- Recommendations  -Continue weekly BPP till delivery. ----------------------------------------------------------------------                  Noralee Spaceavi Shankar, MD Electronically Signed Final Report   07/25/2020 04:59 pm ----------------------------------------------------------------------  US MFM FETAL BPP WO NON STRESS  Result Date:  07/19/2020 ----------------------------------------------------------------------  OBSTETRICS REPORT                       (Signed Final 07/19/2020 04:31 pm) ---------------------------------------------------------------------- Patient Info  ID #:       [id number]                          D.O.B.:  1981/08/09 (39 yrs)  Name:       Kelli Miller                  Visit Date: 07/19/2020 03:35 pm ---------------------------------------------------------------------- Performed By  Attending:        Noralee Spaceavi Shankar MD        Ref. Address:     [address]                                                             [place], [place]  Performed By:     Lenise ArenaHannah Bazemore        Location[facility]                    RD[facility]                                     [facility]  Referred By:      Everardo All ---------------------------------------------------------------------- Orders  #  Description                           Code        Ordered By  1  [place] MFM FETAL BPP WO NON               16109.60    [facility]     STRESS                                            [facility] ----------------------------------------------------------------------  #  Order #                     Accession #                Episode #  1  [id number]                   [id number]                 [id number] ---------------------------------------------------------------------- Indications  Pre-existing diabetes, type 2, in pregnancy,   O24.112  second trimester  Advanced maternal age multigravida 46+,        O88.522  second trimester  Hypertension - Chronic/Pre-existing            O10.019  (labetalol)  Obesity complicating pregnancy, second         O99.212  trimester (BMI 43)  Antiphospholipid syndrome complicating         O99.119, D68.61  pregnancy, antepartum (lovenox)  Hypothyroid                                     O99.280 E03.9  History of cesarean delivery, currently        O34.219  pregnant  Poor obstetric history: Previous               O09.299  preeclampsia / eclampsia/gestational HTN  [redacted] weeks gestation of pregnancy                Z3A.33 ---------------------------------------------------------------------- Vital Signs                                                 Height:        5'1" ---------------------------------------------------------------------- Fetal Evaluation  Num Of Fetuses:         1  Fetal Heart Rate(bpm):  138  Cardiac Activity:       Observed  Presentation:           Cephalic  Placenta:               Posterior  Amniotic Fluid  AFI FV:      Within  normal limits  AFI Sum(cm)     %Tile       Largest Pocket(cm)  13.84           47          4.64  RUQ(cm)       RLQ(cm)       LUQ(cm)        LLQ(cm)  4.64          2.65          2.85           3.7 ---------------------------------------------------------------------- Biophysical Evaluation  Amniotic F.V:   Within normal limits       F. Tone:        Observed  F. Movement:    Observed                   Score:          8/8  F. Breathing:   Observed ---------------------------------------------------------------------- OB History  Blood Type:   A+  Maternal Racial/Ethnic Group:   White  Gravidity:    5         Term:   1        Prem:   0        SAB:   3  TOP:          0       Ectopic:  0        Living: 1 ---------------------------------------------------------------------- Gestational Age  LMP:           33w 4d        Date:  11/27/19                 EDD:   09/02/20  Best:          33w 4d     Det. By:  LMP  (11/27/19)          EDD:   09/02/20 ---------------------------------------------------------------------- Anatomy  Cranium:               Appears normal         Aortic Arch:            Not well visualized  Cavum:                 Appears normal         Ductal Arch:            Previously seen  Ventricles:            Appears normal         Diaphragm:               Previously seen  Choroid Plexus:        Previously seen        Stomach:                Appears normal, left                                                                        sided  Cerebellum:            Previously seen        Abdomen:  Previously seen  Posterior Fossa:       Previously seen        Abdominal Wall:         Previously seen  Nuchal Fold:           Not applicable (>20    Cord Vessels:           Previously seen                         wks GA)  Face:                  Orbits and profile     Kidneys:                Appear normal                         previously seen  Lips:                  Previously seen        Bladder:                Appears normal  Thoracic:              Appears normal         Spine:                  Limited views                                                                        previously seen  Heart:                 Previously seen        Upper Extremities:      Previously seen  RVOT:                  Previously seen        Lower Extremities:      Previously seen  LVOT:                  Previously seen  Other:  Female gender. Technically difficult due to maternal habitus and fetal          position. ---------------------------------------------------------------------- Cervix Uterus Adnexa  Cervix  Not visualized (advanced GA >24wks) ---------------------------------------------------------------------- Impression  Chronic hypertension.  Patient takes labetalol 200 mg twice  daily.  Blood pressures today at our office was 163/64 and  repeat 135/68 mmHg. She does not have symptoms of  severe features of preeclampsia.  Diabetes: Patient takes insulin NPH 84 units at night and 54  units in the morning and NovoLog 06/30/2010 units with meals.  She reports some of her fasting levels are still high.  Amniotic fluid is normal and good fetal activity is seen  .Antenatal testing is reassuring. BPP 8/8.  I have reassured the patient of the findings.  ---------------------------------------------------------------------- Recommendations  -Fetal growth next week.  --Continue weekly BPP till delivery.  -If diabetes is not well controlled, delivery may be considered  at [redacted] weeks gestation.  If both diabetes and hypertension are  well controlled, consider delivery at [redacted] weeks gestation. ----------------------------------------------------------------------  Noralee Space, MD Electronically Signed Final Report   07/19/2020 04:31 pm ----------------------------------------------------------------------  [place] MFM FETAL BPP WO NON STRESS  Result Date: 07/10/2020 ----------------------------------------------------------------------  OBSTETRICS REPORT                       (Signed Final 07/10/2020 04:49 pm) ---------------------------------------------------------------------- Patient Info  ID #:       [id number]                          D.O.B.:  11/20/1980 (39 yrs)  Name:       Kelli Miller                  Visit Date: 07/10/2020 03:41 pm ---------------------------------------------------------------------- Performed By  Attending:        Ma Rings MD         Ref. Address:     [address] [place]  Performed By:     Sandi Mealy        [facility]  Referred By:      Everardo All ---------------------------------------------------------------------- Orders  #  Description                           Code        Ordered By  1  [place] MFM FETAL BPP WO NON               78938.10    YU FANG     STRESS ----------------------------------------------------------------------  #  Order #                     Accession #                Episode #  1   [id number]                   [id number]  [id number] ---------------------------------------------------------------------- Indications  Pre-existing diabetes, type 2, in pregnancy,   O24.112  second trimester  Advanced maternal age multigravida 29+,        O41.522  second trimester  Hypertension - Chronic/Pre-existing            O10.019  Obesity complicating pregnancy, second         O99.212  trimester (BMI 43)  Antiphospholipid syndrome complicating         O99.119, D68.61  pregnancy, antepartum  Hypothyroid                                    O99.280 E03.9  History of cesarean delivery, currently        O34.219  pregnant  Poor obstetric history: Previous               O09.299  preeclampsia / eclampsia/gestational HTN  Encounter for other antenatal screening        Z36.2  follow-up  [redacted] weeks gestation of pregnancy                Z3A.32 ---------------------------------------------------------------------- Fetal Evaluation  Num Of Fetuses:         1  Fetal Heart Rate(bpm):  145  Cardiac Activity:       Observed  Presentation:           Cephalic  Placenta:               Posterior  P. Cord Insertion:      Visualized  Amniotic Fluid  AFI FV:      Within normal limits  AFI Sum(cm)     %Tile       Largest Pocket(cm)  12.67           37          4.01  RUQ(cm)       RLQ(cm)       LUQ(cm)        LLQ(cm)  4.01          2.71          3.08           2.87 ---------------------------------------------------------------------- Biophysical Evaluation  Amniotic F.V:   Within normal limits       F. Tone:        Observed  F. Movement:    Observed                   Score:          8/8  F. Breathing:   Observed ---------------------------------------------------------------------- OB History  Blood Type:   A+  Maternal Racial/Ethnic Group:   White  Gravidity:    5         Term:   1        Prem:   0        SAB:   3  TOP:          0       Ectopic:  0        Living: 1  ---------------------------------------------------------------------- Gestational Age  LMP:           32w 2d        Date:  11/27/19                 EDD:   09/02/20  Best:          Armida Sans 2d  Det. By:  LMP  (11/27/19)          EDD:   09/02/20 ---------------------------------------------------------------------- Comments  This patient was seen for a biophysical profile due to  maternal obesity, chronic hypertension currently treated with  Norvasc and labetalol, the antiphospholipid antibody  syndrome, and diabetes in pregnancy.  She denies any  problems since her last exam.  A biophysical profile performed today was 8 out of 8.  There was normal amniotic fluid noted on today's ultrasound  exam.  Another biophysical profile was scheduled in 1 week. ----------------------------------------------------------------------                   Ma Rings, MD Electronically Signed Final Report   07/10/2020 04:49 pm ----------------------------------------------------------------------  [place] MFM OB FOLLOW UP  Result Date: 07/25/2020 ----------------------------------------------------------------------  OBSTETRICS REPORT                       (Signed Final 07/25/2020 04:59 pm) ---------------------------------------------------------------------- Patient Info  ID #:       [id number]                          D.O.B.:  January 20, 1981 (39 yrs)  Name:       Kelli Miller                  Visit Date: 07/25/2020 04:14 pm ---------------------------------------------------------------------- Performed By  Attending:        Noralee Space MD        Ref. Address:     [address], [place]  Performed By:     Truitt Leep,      Location[facility]                                [facility]  Referred By:      Everardo All ---------------------------------------------------------------------- Orders  #  Description                           Code        Ordered By  1  [place] MFM FETAL  BPP WO NON               76819.01    RAVI SHANKAR     STRESS  2  [place] MFM OB FOLLOW UP                   [id number]    RAVI SHANKAR ----------------------------------------------------------------------  #  Order #                     Accession #                Episode #  1  [id number]                   [id number]                 [id number]  2  [id number]                   [id number]                 [id number] ---------------------------------------------------------------------- Indications  Pre-existing diabetes, type 2, in pregnancy,   O24.112  second trimester  Advanced maternal age multigravida 46+,        O51.522  second trimester  Hypertension - Chronic/Pre-existing            O10.019  (labetalol)  Obesity complicating pregnancy, second         O99.212  trimester (BMI 43)  Antiphospholipid syndrome complicating         O99.119, D68.61  pregnancy, antepartum (lovenox)  Hypothyroid                                    O99.280 E03.9  History of cesarean delivery, currently        O34.219  pregnant  Poor obstetric history: Previous               O09.299  preeclampsia / eclampsia/gestational HTN  [redacted] weeks gestation of pregnancy                Z3A.34 ---------------------------------------------------------------------- Vital Signs                                                 Height:        5'1" ---------------------------------------------------------------------- Fetal Evaluation  Num Of Fetuses:         1  Cardiac Activity:       Observed  Presentation:           Transverse, HMR (unstbale lie)  Placenta:               Fundal  P. Cord Insertion:      Previously Visualized  Amniotic Fluid  AFI FV:      Within normal limits  AFI Sum(cm)     %Tile       Largest Pocket(cm)  9.9             19          4.3  RUQ(cm)        RLQ(cm)       LUQ(cm)        LLQ(cm)  3.6           0  2              4.3 ---------------------------------------------------------------------- Biophysical Evaluation  Amniotic F.V:   Pocket => 2 cm             F. Tone:        Observed  F. Movement:    Observed                   Score:          8/8  F. Breathing:   Observed ---------------------------------------------------------------------- Biometry  BPD:      88.3  mm     G. Age:  35w 5d         83  %    CI:        79.05   %    70 - 86                                                          FL/HC:      21.3   %    19.4 - 21.8  HC:       314   mm     G. Age:  35w 1d         33  %    HC/AC:      1.02        0.96 - 1.11  AC:      306.8  mm     G. Age:  34w 4d         61  %    FL/BPD:     75.9   %    71 - 87  FL:         67  mm     G. Age:  34w 3d         42  %    FL/AC:      21.8   %    20 - 24  Est. FW:    2506  gm      5 lb 8 oz     54  % ---------------------------------------------------------------------- OB History  Blood Type:   A+  Maternal Racial/Ethnic Group:   White  Gravidity:    5         Term:   1        Prem:   0        SAB:   3  TOP:          0       Ectopic:  0        Living: 1 ---------------------------------------------------------------------- Gestational Age  LMP:           34w 3d        Date:  11/27/19                 EDD:   09/02/20  U/S Today:     35w 0d                                        EDD:   08/29/20  Best:          34w 3d     Det. By:  LMP  (  11/27/19)          EDD:   09/02/20 ---------------------------------------------------------------------- Anatomy  Cranium:               Appears normal         RVOT:                   Appears normal  Cavum:                 Appears normal         LVOT:                   Appears normal  Face:                  Appears normal         Diaphragm:              Appears normal                         (orbits and profile)  Lips:                  Appears normal         Stomach:                 Appears normal, left                                                                        sided  Thoracic:              Appears normal         Kidneys:                Appear normal  Heart:                 Appears normal         Bladder:                Appears normal                         (4CH, axis, and                         situs)  Other:  Other anatomy previously iamges and appeared normal. ---------------------------------------------------------------------- Cervix Uterus Adnexa  Cervix  Not visualized (advanced GA >24wks) ---------------------------------------------------------------------- Impression  Chronic hypertension.  Patient takes labetalol 200 mg twice  daily.  Blood pressures today at our office was 126/63 mm  Hg.  Diabetes: Patient takes insulin NPH 90 units at night and 60  units in the morning and NovoLog 07/02/10 units with meals.  She had increased insulin dosage as advised by her  endocrinologist.  Fetal growth is appropriate for gestational age .Amniotic fluid  is normal and good fetal activity is seen .Antenatal testing is  reassuring. BPP 8/8.  I reassured the patient of the findings.  We discussed timing of delivery.  If diabetes is suboptimally  controlled, I recommend delivery at [redacted] weeks gestation. ---------------------------------------------------------------------- Recommendations  -Continue weekly BPP till delivery. ----------------------------------------------------------------------  Noralee Space, MD Electronically Signed Final Report   07/25/2020 04:59 pm ----------------------------------------------------------------------   MAU Course  Procedures  MDM -VB in third trimester with pelvic cramping -on exam: minimal clear-yellow mucus mixed with bright red blood coming from external os, cervix visually closed, no active bleeding noted -remote from intercourse, pt reports last intercourse over a month ago -UA: straw/SG 1.004/lg hgb/rare bacteria,  sending urine for culture based on symptoms -CE: long/closed/posterior -[place]: VTX, fundal placenta, AFI 17.2cm, no abruption or previa identified -WetPrep: WNL -GC/CT collected -EFM: reactive       -baseline: 140       -variability: moderate       -accels: present, 15x15       -decels: absent       -TOCO: quiet  -elevated BP on admission in setting of known cHTN -asymptomatic -CBC: H/H 11.2/33.0, platelets 223 -CMP: serum creatinine 0.51, AST/ALT 13/12 -PCr: below reportable range  -consulted with Dr. Alysia Penna 39yo [id number] at [redacted]w[redacted]d scheduled for repeat C/S at 37weeks, here with vaginal spotting at home, last intercourse over a month ago, on exam bright red blood mixed with mucus present at os, no active bleeding noted, normal [place], hx significant for DM, hypothyroid, APL pos, on lovenox, low dose ASA -per Dr. Alysia Penna, pt OK to be discharged home with good precautions -pt denies any additional episodes of bleeding since coming to MAU -pt discharged to home in stable condition  Orders Placed This Encounter  Procedures  . Wet prep, genital    Standing Status:   Standing    Number of Occurrences:   1  . Culture, OB Urine    Standing Status:   Standing    Number of Occurrences:   1  . [place] MFM OB LIMITED    Standing Status:   Standing    Number of Occurrences:   1    Order Specific Question:   Symptom/Reason for Exam    Answer:   Vaginal bleeding in pregnancy [705036]  . Urinalysis, Routine w reflex microscopic Urine, Clean Catch    Standing Status:   Standing    Number of Occurrences:   1  . CBC    Standing Status:   Standing    Number of Occurrences:   1  . Comprehensive metabolic panel    Standing Status:   Standing    Number of Occurrences:   1  . Protein / creatinine ratio, urine    Standing Status:   Standing    Number of Occurrences:   1  . Diet NPO time specified    Standing Status:   Standing    Number of Occurrences:   1  . Discharge patient    Order Specific Question:    Discharge disposition    Answer:   01-Home or Self Care [1]    Order Specific Question:   Discharge patient date    Answer:   08/03/2020   No orders of the defined types were placed in this encounter.   Assessment and Plan   1. [redacted] weeks gestation of pregnancy   2. Unwanted fertility   3. Preexisting diabetes complicating pregnancy, antepartum   4. Hypothyroid in pregnancy, antepartum   5. Supervision of high risk pregnancy, antepartum   6. Vaginal bleeding in pregnancy   7. NST (non-stress test) reactive   8. Blood type, Rh positive     Allergies as of 08/03/2020      Reactions   Bactrim [sulfamethoxazole-trimethoprim] Rash  Medication List    TAKE these medications   acetaminophen 500 MG tablet Commonly known as: TYLENOL Take 1,000 mg by mouth every 6 (six) hours as needed for mild pain or moderate pain.   amLODipine 10 MG tablet Commonly known as: NORVASC TAKE ONE TABLET BY MOUTH DAILY What changed: when to take this   aspirin EC 81 MG tablet Take 81 mg by mouth at bedtime.   Clobetasol Prop Emollient Base 0.05 % emollient cream Apply 1 application topically daily as needed (Psoriasis).   Clobetasol Propionate 0.05 % shampoo Apply 1 application topically daily as needed (Psoriasis).   cyanocobalamin 1000 MCG/ML injection Commonly known as: (VITAMIN B-12) Inject 1 mL (1,000 mcg total) into the muscle every 30 (thirty) days.   enoxaparin 100 MG/ML injection Commonly known as: Lovenox Inject 1 mL (100 mg total) into the skin daily. What changed: when to take this   Euthyrox 50 MCG tablet Generic drug: levothyroxine TAKE 1 TABLET BY MOUTH ONCE DAILY BEFORE BREAKFAST What changed: See the new instructions.   insulin aspart 100 UNIT/ML injection Commonly known as: novoLOG 9 units in AM and 9 units @ bedtime What changed:   how much to take  how to take this  when to take this  additional instructions   insulin NPH Human 100 UNIT/ML  injection Commonly known as: NOVOLIN N Inject 0.5 mLs (50 Units total) into the skin 2 (two) times daily before a meal. Take 50 units subcutaneously with breakfast and 54 units at bedtime What changed:   how much to take  when to take this  additional instructions   labetalol 200 MG tablet Commonly known as: NORMODYNE Take 1 tablet (200 mg total) by mouth 2 (two) times daily.   metFORMIN 1000 MG tablet Commonly known as: GLUCOPHAGE Take 1 tablet (1,000 mg total) by mouth 2 (two) times daily with a meal.   omeprazole 20 MG capsule Commonly known as: PRILOSEC Take 1 capsule (20 mg total) by mouth daily. What changed: when to take this   PRENATAL VITAMIN PO Take 1 tablet by mouth at bedtime.   progesterone 200 MG capsule Commonly known as: PROMETRIUM Take 1 capsule (200 mg total) by mouth daily. What changed: See the new instructions.   Stelara 90 MG/ML Sosy injection Generic drug: ustekinumab Inject 90 mg into the skin See admin instructions. EVERY 12 WEEKS       -will call with culture results, if positive -discussed s/sx of placental abruption and when to return to MAU -pt to return to MAU with any worsening bleeding, cramping, LBP or contractions -return MAU precautions given -pt discharged to home in stable condition  Joni Reining E Breyah Akhter 08/03/2020, 3:01 AM

## 2020-08-04 LAB — CULTURE, OB URINE

## 2020-08-05 LAB — GC/CHLAMYDIA PROBE AMP (~~LOC~~) NOT AT ARMC
Chlamydia: NEGATIVE
Comment: NEGATIVE
Comment: NORMAL
Neisseria Gonorrhea: NEGATIVE

## 2020-08-08 ENCOUNTER — Ambulatory Visit: Payer: 59 | Attending: Obstetrics and Gynecology

## 2020-08-08 ENCOUNTER — Other Ambulatory Visit: Payer: Self-pay | Admitting: Osteopathic Medicine

## 2020-08-08 ENCOUNTER — Ambulatory Visit: Payer: 59 | Admitting: *Deleted

## 2020-08-08 ENCOUNTER — Encounter: Payer: Self-pay | Admitting: Obstetrics and Gynecology

## 2020-08-08 ENCOUNTER — Other Ambulatory Visit: Payer: Self-pay

## 2020-08-08 ENCOUNTER — Ambulatory Visit (INDEPENDENT_AMBULATORY_CARE_PROVIDER_SITE_OTHER): Payer: 59 | Admitting: Obstetrics and Gynecology

## 2020-08-08 VITALS — BP 136/81 | HR 89 | Wt 258.0 lb

## 2020-08-08 DIAGNOSIS — O24319 Unspecified pre-existing diabetes mellitus in pregnancy, unspecified trimester: Secondary | ICD-10-CM | POA: Insufficient documentation

## 2020-08-08 DIAGNOSIS — O99283 Endocrine, nutritional and metabolic diseases complicating pregnancy, third trimester: Secondary | ICD-10-CM

## 2020-08-08 DIAGNOSIS — O99280 Endocrine, nutritional and metabolic diseases complicating pregnancy, unspecified trimester: Secondary | ICD-10-CM | POA: Diagnosis present

## 2020-08-08 DIAGNOSIS — O99213 Obesity complicating pregnancy, third trimester: Secondary | ICD-10-CM | POA: Diagnosis not present

## 2020-08-08 DIAGNOSIS — O0990 Supervision of high risk pregnancy, unspecified, unspecified trimester: Secondary | ICD-10-CM | POA: Insufficient documentation

## 2020-08-08 DIAGNOSIS — O99210 Obesity complicating pregnancy, unspecified trimester: Secondary | ICD-10-CM

## 2020-08-08 DIAGNOSIS — O24313 Unspecified pre-existing diabetes mellitus in pregnancy, third trimester: Secondary | ICD-10-CM | POA: Diagnosis not present

## 2020-08-08 DIAGNOSIS — O99113 Other diseases of the blood and blood-forming organs and certain disorders involving the immune mechanism complicating pregnancy, third trimester: Secondary | ICD-10-CM

## 2020-08-08 DIAGNOSIS — O10919 Unspecified pre-existing hypertension complicating pregnancy, unspecified trimester: Secondary | ICD-10-CM

## 2020-08-08 DIAGNOSIS — Z3A36 36 weeks gestation of pregnancy: Secondary | ICD-10-CM

## 2020-08-08 DIAGNOSIS — Z3009 Encounter for other general counseling and advice on contraception: Secondary | ICD-10-CM | POA: Diagnosis present

## 2020-08-08 DIAGNOSIS — E039 Hypothyroidism, unspecified: Secondary | ICD-10-CM

## 2020-08-08 DIAGNOSIS — Z98891 History of uterine scar from previous surgery: Secondary | ICD-10-CM

## 2020-08-08 DIAGNOSIS — O09299 Supervision of pregnancy with other poor reproductive or obstetric history, unspecified trimester: Secondary | ICD-10-CM

## 2020-08-08 DIAGNOSIS — O09529 Supervision of elderly multigravida, unspecified trimester: Secondary | ICD-10-CM

## 2020-08-08 DIAGNOSIS — O09523 Supervision of elderly multigravida, third trimester: Secondary | ICD-10-CM | POA: Diagnosis not present

## 2020-08-08 DIAGNOSIS — O09293 Supervision of pregnancy with other poor reproductive or obstetric history, third trimester: Secondary | ICD-10-CM

## 2020-08-08 DIAGNOSIS — O34219 Maternal care for unspecified type scar from previous cesarean delivery: Secondary | ICD-10-CM

## 2020-08-08 DIAGNOSIS — D6861 Antiphospholipid syndrome: Secondary | ICD-10-CM

## 2020-08-08 DIAGNOSIS — O10913 Unspecified pre-existing hypertension complicating pregnancy, third trimester: Secondary | ICD-10-CM

## 2020-08-08 IMAGING — US US MFM FETAL BPP W/O NON-STRESS
1 series · 14 of 28 positions shown · non-contrast
Comparison: none

[Series 1: us mfm fetal bpp w/o non-stress · 32 acquisitions, 14 frames shown]
[im 2/32]
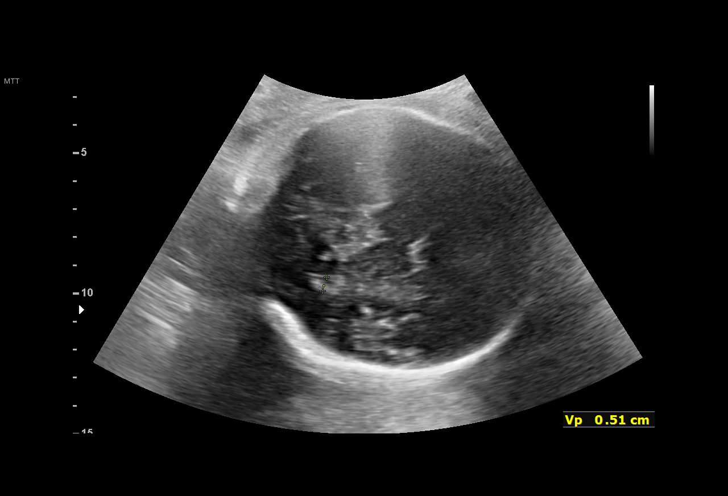
[im 4/32]
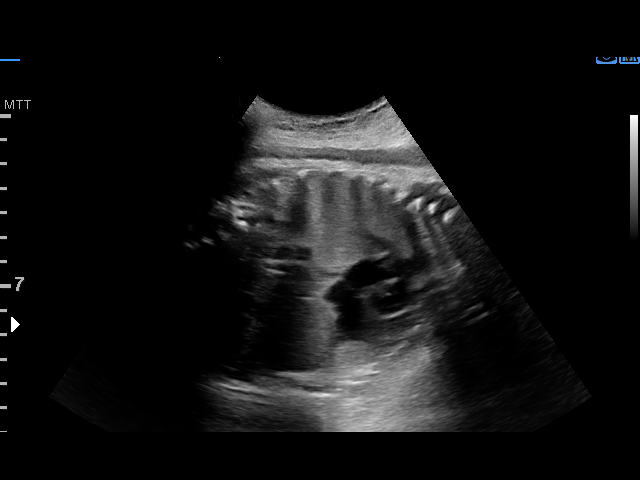
[im 6/32]
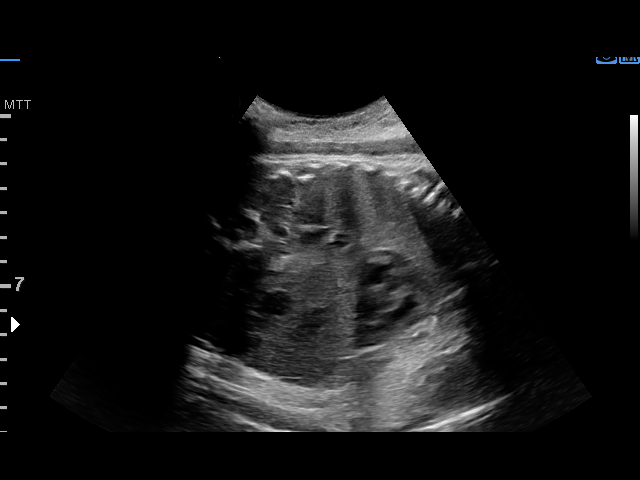
[im 9/32]
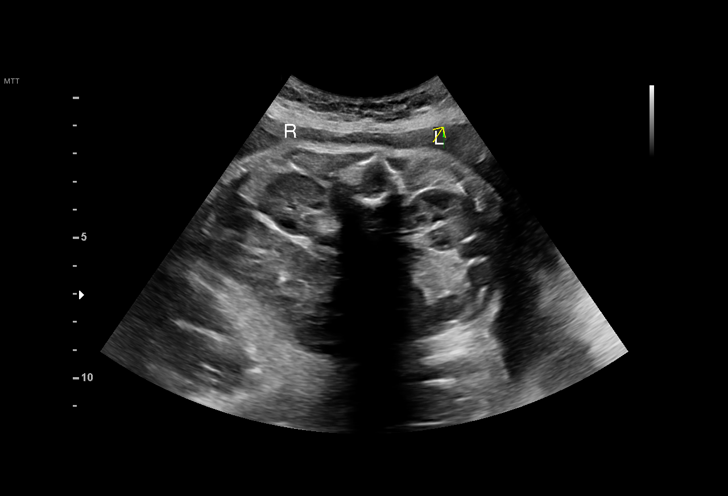
[im 11/32]
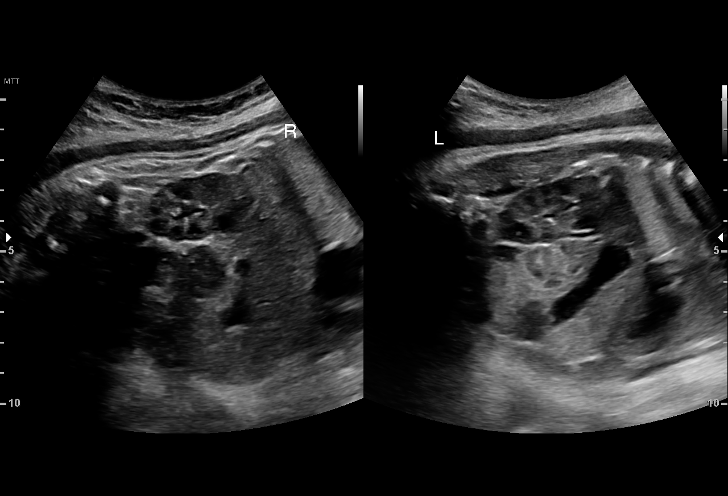
[im 13/32]
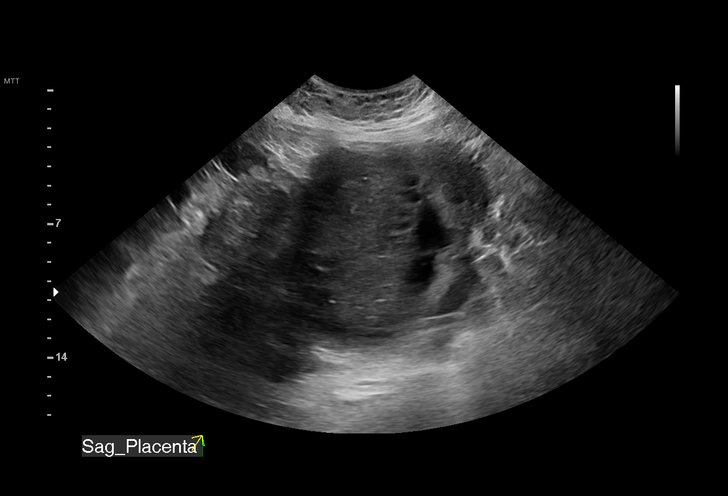
[im 15/32]
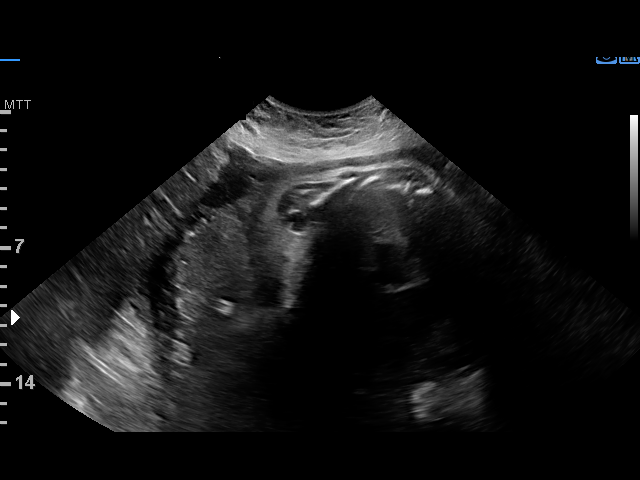
[im 18/32]
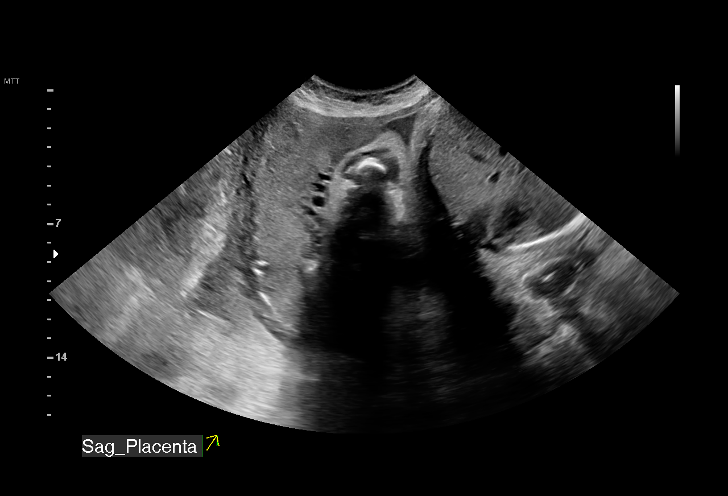
[im 20/32]
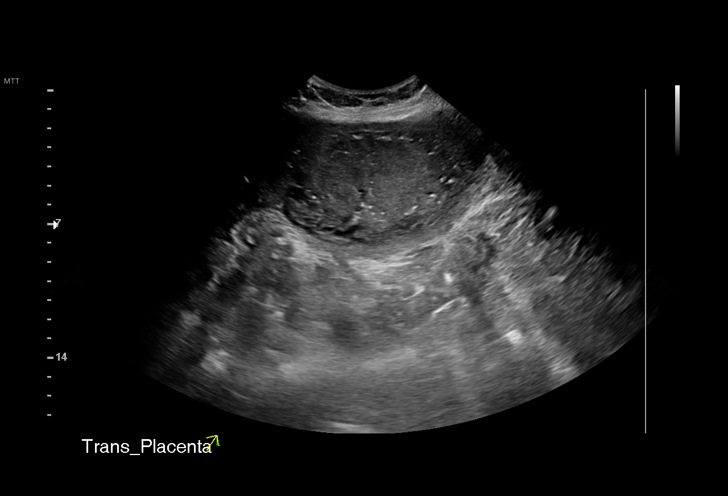
[im 22/32]
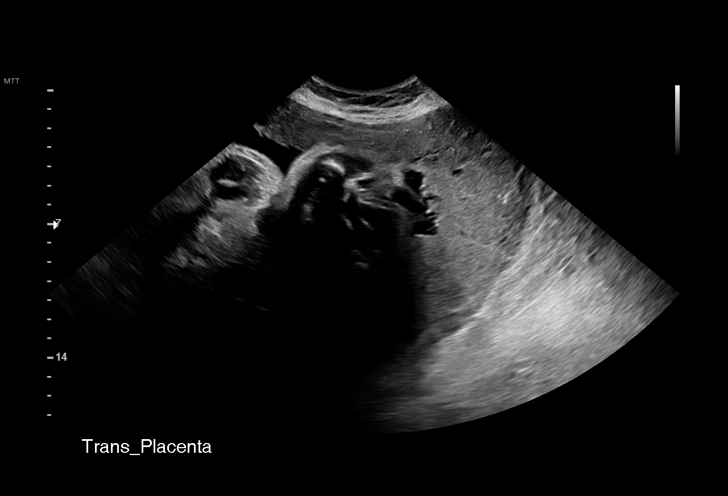
[im 25/32]
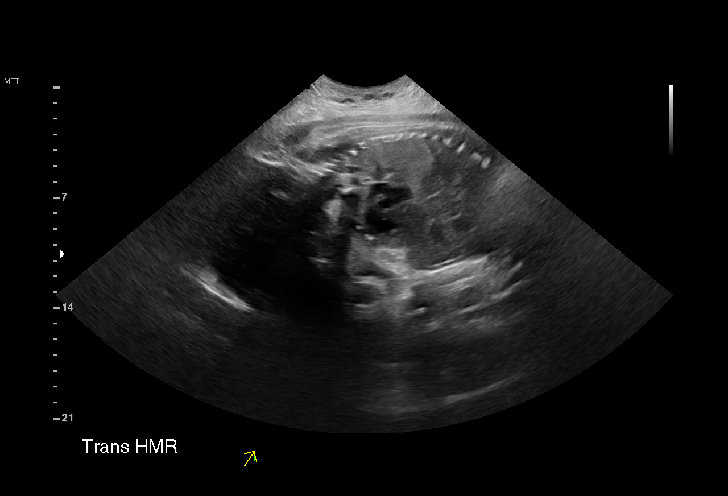
[im 27/32]
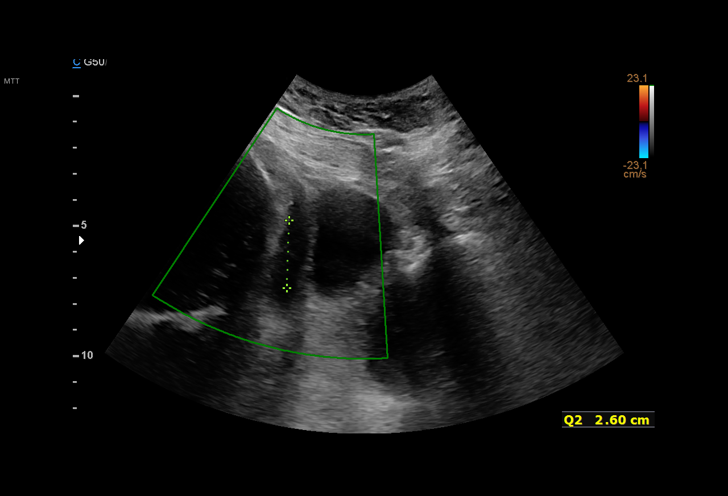
[im 29/32]
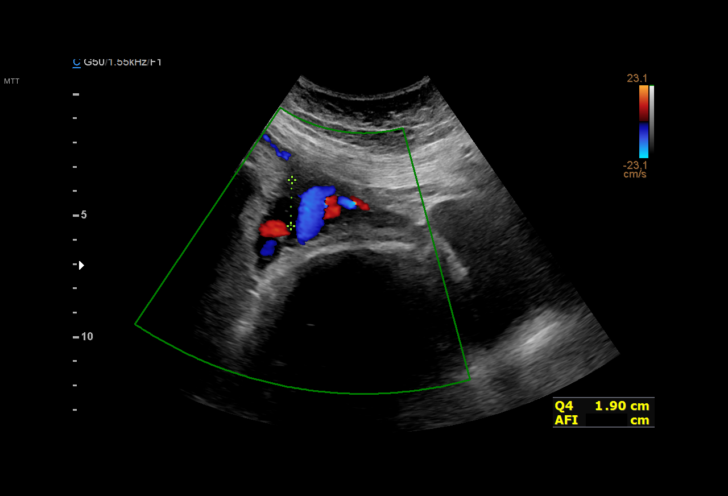
[im 32/32]
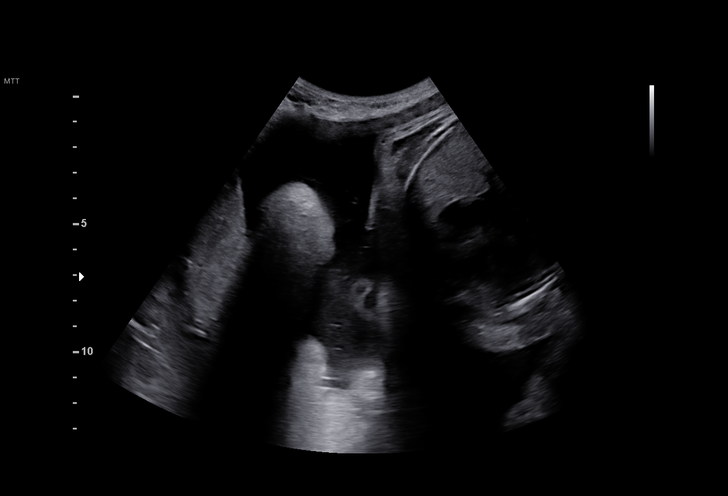

[14 of 28 positions shown; findings below may reference images not displayed]

Indications

 Diabetes - Pregestational,3rd trimester        [B6]
 Advanced maternal age multigravida 35+,        [B6]
 third trimester
 Obesity complicating pregnancy, third          [B6]
 trimester
 36 weeks gestation of pregnancy
 Hypertension - Chronic/Pre-existing            [B6]
 (labetalol)
 Antiphospholipid syndrome complicating         O99.119, [B6]
 pregnancy, antepartum (lovenox)
 Hypothyroid                                    [B6] [B6]
 History of cesarean delivery, currently        [B6]
 pregnant
 Poor obstetric history: Previous               [B6]
 preeclampsia / eclampsia/gestational HTN
Vital Signs

                                                Height:        5'1"
Fetal Evaluation

 Num Of Fetuses:         1
 Fetal Heart Rate(bpm):  138
 Cardiac Activity:       Observed
 Presentation:           Variable
 Placenta:               Posterior Fundal
 P. Cord Insertion:      Previously Visualized
 Amniotic Fluid
 AFI FV:      Within normal limits

 AFI Sum(cm)     %Tile       Largest Pocket(cm)
 11.5            34

 RUQ(cm)       RLQ(cm)       LUQ(cm)        LLQ(cm)


 Comment:    Cephalic at beginning, Rolled into Transverse Head Maternal
             Right position.
Biophysical Evaluation

 Amniotic F.V:   Within normal limits       F. Tone:        Observed
 F. Movement:    Observed                   Score:          [DATE]
 F. Breathing:   Observed
Biometry

 LV:        5.1  mm
OB History

 Blood Type:   A+
 Maternal Racial/Ethnic Group:   White
 Gravidity:    5         Term:   1        Prem:   0        SAB:   3
 TOP:          0       Ectopic:  0        Living: 1
Gestational Age

 LMP:           36w 3d        Date:  [DATE]                 EDD:   [DATE]
 Best:          36w 3d     Det. By:  LMP  ([DATE])          EDD:   [DATE]
Anatomy

 Cranium:               Previously seen        Aortic Arch:            Not well visualized
 Cavum:                 Previously seen        Ductal Arch:            Previously seen
 Ventricles:            Appears normal         Diaphragm:              Appears normal
 Choroid Plexus:        Previously seen        Stomach:                Appears normal, left
                                                                       sided
 Cerebellum:            Previously seen        Abdomen:                Previously seen
 Posterior Fossa:       Previously seen        Abdominal Wall:         Previously seen
 Nuchal Fold:           Not applicable (>20    Cord Vessels:           Previously seen
                        wks GA)
 Face:                  Orbits and profile     Kidneys:                Appear normal
                        previously seen
 Lips:                  Previously seen        Bladder:                Appears normal
 Thoracic:              Previously seen        Spine:                  Limited views
                                                                       previously seen
 Heart:                 Appears normal         Upper Extremities:      Previously seen
                        (4CH, axis, and
                        situs)
 RVOT:                  Previously seen        Lower Extremities:      Previously seen
 LVOT:                  Previously seen

 Other:  Male gender previously seen. Technically difficult due to maternal
         habitus and fetal position.
Cervix Uterus Adnexa

 Cervix
 Not visualized (advanced GA >[B6])

 Uterus
 No abnormality visualized.

 Right Ovary
 No adnexal mass visualized.

 Left Ovary
 No adnexal mass visualized.

 Cul De Sac
 No free fluid seen.

 Adnexa
 No abnormality visualized.
Comments

 This patient was seen for a biophysical profile due to
 maternal obesity, chronic hypertension currently treated with
 Norvasc and labetalol, the antiphospholipid antibody
 syndrome, and diabetes in pregnancy.  She denies any
 problems since her last exam.
 A biophysical profile performed today was [DATE].
 There was normal amniotic fluid noted on today's ultrasound
 exam.
 The patient already has a cesarean delivery scheduled in 4
 days.

## 2020-08-08 NOTE — Progress Notes (Signed)
PRENATAL VISIT NOTE  Subjective:  Kelli Miller is a 39 y.o. [phone] at [redacted]w[redacted]d being seen today for ongoing prenatal care.  She is currently monitored for the following issues for this high-risk pregnancy and has Advanced maternal age in multigravida; Preexisting hypertension complicating pregnancy, antepartum; PCOS (polycystic ovarian syndrome); GERD (gastroesophageal reflux disease); Psoriasis; Hypothyroidism; Diabetes mellitus (HCC); B12 deficiency; Vitamin D deficiency; Family history of premature CAD; History of antiphospholipid syndrome; Supervision of high risk pregnancy, antepartum; Preexisting diabetes complicating pregnancy, antepartum; Hypothyroid in pregnancy, antepartum; History of severe preeclampsia superimposed on chronic hypertension in prior pregnancy; History of cesarean section for failure to progress; Maternal morbid obesity, antepartum (HCC); Left ankle pain; and Unwanted fertility on their problem list.  Patient reports no complaints.  Contractions: Not present. Vag. Bleeding: None.  Movement: Present. Denies leaking of fluid.   The following portions of the patient's history were reviewed and updated as appropriate: allergies, current medications, past family history, past medical history, past social history, past surgical history and problem list.   Objective:   Vitals:   08/08/20 1300  BP: 136/81  Pulse: 89  Weight: 258 lb (117 kg)    Fetal Status:     Movement: Present     General:  Alert, oriented and cooperative. Patient is in no acute distress.  Skin: Skin is warm and dry. No rash noted.   Cardiovascular: Normal heart rate noted  Respiratory: Normal respiratory effort, no problems with respiration noted  Abdomen: Soft, gravid, appropriate for gestational age.  Pain/Pressure: Present     Pelvic: Cervical exam deferred        Extremities: Normal range of motion.     Mental Status: Normal mood and affect. Normal behavior. Normal judgment and thought content.    Assessment and Plan:  Pregnancy: G5P1031 at 109w3d 1. [redacted] weeks gestation of pregnancy - Culture, beta strep (group b only)  2. Supervision of high risk pregnancy, antepartum Patient is doing well without complaints Continue lovenox until 10/16 - Culture, beta strep (group b only)  3. Preexisting diabetes complicating pregnancy, antepartum CBGS reviewed and overall improved since Insulin increase. Patient has several episodes of hypoglycemia as low as 46 prior to lunch 4 days in a row. Will decrease am NPH back to 9 and regular to 64 instead of 66  Follow up ultrasound later today  4. Preexisting hypertension complicating pregnancy, antepartum Normotensive on labetalol  5. Maternal morbid obesity, antepartum (HCC)   6. Hypothyroid in pregnancy, antepartum Continue synthroid  7. History of cesarean section for failure to progress Patient scheduled for repeat with BTL on 10/18  8. History of severe preeclampsia superimposed on chronic hypertension in prior pregnancy Reviewed si/sx  9. Multigravida of advanced maternal age in third trimester   Preterm labor symptoms and general obstetric precautions including but not limited to vaginal bleeding, contractions, leaking of fluid and fetal movement were reviewed in detail with the patient. Please refer to After Visit Summary for other counseling recommendations.   Return in about 6 weeks (around 09/19/2020) for in person, postpartum.  Future Appointments  Date Time Provider Department Center  08/08/2020  3:00 PM [facility] NURSE [facility]  08/08/2020  3:15 PM [facility] US2 [facility]  08/09/2020 10:30 AM [facility] PAT 1 [facility] None  08/09/2020 11:30 AM [facility]-SCREENING [facility] None  08/16/2020  3:15 PM [facility] NURSE [facility]  08/16/2020  3:30 PM [facility] US3 [facility]  08/22/2020  3:00 PM [facility] NURSE [facility]  08/22/2020  3:15 PM  [facility] US3 [facility]    Catalina Antigua, MD

## 2020-08-09 ENCOUNTER — Other Ambulatory Visit (HOSPITAL_COMMUNITY)
Admission: RE | Admit: 2020-08-09 | Discharge: 2020-08-09 | Disposition: A | Discharge status: Home / Self Care | Payer: 59 | Source: Ambulatory Visit | Attending: Obstetrics and Gynecology | Admitting: Obstetrics and Gynecology

## 2020-08-09 ENCOUNTER — Other Ambulatory Visit (HOSPITAL_COMMUNITY)
Admission: RE | Admit: 2020-08-09 | Discharge: 2020-08-09 | Disposition: A | Discharge status: Home / Self Care | Payer: 59 | Source: Ambulatory Visit | Attending: Family Medicine | Admitting: Family Medicine

## 2020-08-09 DIAGNOSIS — Z01812 Encounter for preprocedural laboratory examination: Secondary | ICD-10-CM | POA: Insufficient documentation

## 2020-08-09 DIAGNOSIS — Z20822 Contact with and (suspected) exposure to covid-19: Secondary | ICD-10-CM | POA: Insufficient documentation

## 2020-08-09 LAB — CBC
HCT: 34.2 % — ABNORMAL LOW (ref 36.0–46.0)
Hemoglobin: 11.2 g/dL — ABNORMAL LOW (ref 12.0–15.0)
MCH: 30.7 pg (ref 26.0–34.0)
MCHC: 32.7 g/dL (ref 30.0–36.0)
MCV: 93.7 fL (ref 80.0–100.0)
Platelets: 220 10*3/uL (ref 150–400)
RBC: 3.65 MIL/uL — ABNORMAL LOW (ref 3.87–5.11)
RDW: 14.7 % (ref 11.5–15.5)
WBC: 10 10*3/uL (ref 4.0–10.5)
nRBC: 0 % (ref 0.0–0.2)

## 2020-08-09 LAB — SARS CORONAVIRUS 2 (TAT 6-24 HRS): SARS Coronavirus 2: NEGATIVE

## 2020-08-09 LAB — TYPE AND SCREEN
ABO/RH(D): AB POS
Antibody Screen: NEGATIVE

## 2020-08-10 LAB — CULTURE, BETA STREP (GROUP B ONLY)
MICRO NUMBER:: 11075546
SPECIMEN QUALITY:: ADEQUATE

## 2020-08-10 LAB — RPR: RPR Ser Ql: NONREACTIVE

## 2020-08-12 ENCOUNTER — Inpatient Hospital Stay (HOSPITAL_COMMUNITY): Payer: 59 | Admitting: Anesthesiology

## 2020-08-12 ENCOUNTER — Encounter (HOSPITAL_COMMUNITY): Payer: Self-pay | Admitting: Obstetrics and Gynecology

## 2020-08-12 ENCOUNTER — Other Ambulatory Visit: Payer: Self-pay

## 2020-08-12 ENCOUNTER — Encounter (HOSPITAL_COMMUNITY)
Admission: AD | Disposition: A | Discharge status: Home / Self Care | Payer: Self-pay | Source: Home / Self Care | Attending: Obstetrics and Gynecology

## 2020-08-12 ENCOUNTER — Inpatient Hospital Stay (HOSPITAL_COMMUNITY)
Admission: AD | Admit: 2020-08-12 | Discharge: 2020-08-14 | DRG: 783 | Disposition: A | Discharge status: Home / Self Care | Payer: 59 | Attending: Obstetrics and Gynecology | Admitting: Obstetrics and Gynecology

## 2020-08-12 DIAGNOSIS — L409 Psoriasis, unspecified: Secondary | ICD-10-CM | POA: Diagnosis present

## 2020-08-12 DIAGNOSIS — B951 Streptococcus, group B, as the cause of diseases classified elsewhere: Secondary | ICD-10-CM

## 2020-08-12 DIAGNOSIS — Z862 Personal history of diseases of the blood and blood-forming organs and certain disorders involving the immune mechanism: Secondary | ICD-10-CM

## 2020-08-12 DIAGNOSIS — Z20822 Contact with and (suspected) exposure to covid-19: Secondary | ICD-10-CM | POA: Diagnosis present

## 2020-08-12 DIAGNOSIS — O9972 Diseases of the skin and subcutaneous tissue complicating childbirth: Secondary | ICD-10-CM | POA: Diagnosis present

## 2020-08-12 DIAGNOSIS — K219 Gastro-esophageal reflux disease without esophagitis: Secondary | ICD-10-CM | POA: Diagnosis present

## 2020-08-12 DIAGNOSIS — Z7984 Long term (current) use of oral hypoglycemic drugs: Secondary | ICD-10-CM | POA: Diagnosis not present

## 2020-08-12 DIAGNOSIS — O99280 Endocrine, nutritional and metabolic diseases complicating pregnancy, unspecified trimester: Secondary | ICD-10-CM | POA: Diagnosis present

## 2020-08-12 DIAGNOSIS — E119 Type 2 diabetes mellitus without complications: Secondary | ICD-10-CM | POA: Diagnosis present

## 2020-08-12 DIAGNOSIS — D6861 Antiphospholipid syndrome: Secondary | ICD-10-CM | POA: Diagnosis present

## 2020-08-12 DIAGNOSIS — Z302 Encounter for sterilization: Secondary | ICD-10-CM

## 2020-08-12 DIAGNOSIS — O34211 Maternal care for low transverse scar from previous cesarean delivery: Principal | ICD-10-CM | POA: Diagnosis present

## 2020-08-12 DIAGNOSIS — O09529 Supervision of elderly multigravida, unspecified trimester: Secondary | ICD-10-CM

## 2020-08-12 DIAGNOSIS — D62 Acute posthemorrhagic anemia: Secondary | ICD-10-CM | POA: Diagnosis not present

## 2020-08-12 DIAGNOSIS — Z98891 History of uterine scar from previous surgery: Secondary | ICD-10-CM

## 2020-08-12 DIAGNOSIS — O1002 Pre-existing essential hypertension complicating childbirth: Secondary | ICD-10-CM | POA: Diagnosis present

## 2020-08-12 DIAGNOSIS — E039 Hypothyroidism, unspecified: Secondary | ICD-10-CM | POA: Diagnosis present

## 2020-08-12 DIAGNOSIS — O09523 Supervision of elderly multigravida, third trimester: Secondary | ICD-10-CM

## 2020-08-12 DIAGNOSIS — E282 Polycystic ovarian syndrome: Secondary | ICD-10-CM | POA: Diagnosis present

## 2020-08-12 DIAGNOSIS — O10919 Unspecified pre-existing hypertension complicating pregnancy, unspecified trimester: Secondary | ICD-10-CM | POA: Diagnosis present

## 2020-08-12 DIAGNOSIS — O09299 Supervision of pregnancy with other poor reproductive or obstetric history, unspecified trimester: Secondary | ICD-10-CM

## 2020-08-12 DIAGNOSIS — O2412 Pre-existing diabetes mellitus, type 2, in childbirth: Secondary | ICD-10-CM | POA: Diagnosis present

## 2020-08-12 DIAGNOSIS — O99284 Endocrine, nutritional and metabolic diseases complicating childbirth: Secondary | ICD-10-CM | POA: Diagnosis present

## 2020-08-12 DIAGNOSIS — Z3A37 37 weeks gestation of pregnancy: Secondary | ICD-10-CM

## 2020-08-12 DIAGNOSIS — O9912 Other diseases of the blood and blood-forming organs and certain disorders involving the immune mechanism complicating childbirth: Secondary | ICD-10-CM | POA: Diagnosis present

## 2020-08-12 DIAGNOSIS — O99824 Streptococcus B carrier state complicating childbirth: Secondary | ICD-10-CM | POA: Diagnosis present

## 2020-08-12 DIAGNOSIS — O99214 Obesity complicating childbirth: Secondary | ICD-10-CM | POA: Diagnosis present

## 2020-08-12 DIAGNOSIS — O9081 Anemia of the puerperium: Secondary | ICD-10-CM | POA: Diagnosis not present

## 2020-08-12 LAB — HEMOGLOBIN A1C
Hgb A1c MFr Bld: 5.1 % (ref 4.8–5.6)
Mean Plasma Glucose: 100 mg/dL

## 2020-08-12 LAB — GLUCOSE, CAPILLARY
Glucose-Capillary: 114 mg/dL — ABNORMAL HIGH (ref 70–99)
Glucose-Capillary: 101 mg/dL — ABNORMAL HIGH (ref 70–99)
Glucose-Capillary: 215 mg/dL — ABNORMAL HIGH (ref 70–99)
Glucose-Capillary: 187 mg/dL — ABNORMAL HIGH (ref 70–99)

## 2020-08-12 SURGERY — Surgical Case
Anesthesia: Spinal | Site: Abdomen | Wound class: Clean Contaminated

## 2020-08-12 MED ORDER — DEXAMETHASONE SODIUM PHOSPHATE 4 MG/ML IJ SOLN
INTRAMUSCULAR | Status: AC
  Filled 2020-08-12: qty 2

## 2020-08-12 MED ORDER — ONDANSETRON HCL 4 MG/2ML IJ SOLN
INTRAMUSCULAR | Status: DC | PRN
  Administered 2020-08-12: 4 mg via INTRAVENOUS

## 2020-08-12 MED ORDER — NALOXONE HCL 0.4 MG/ML IJ SOLN: 0.4000 mg | INTRAMUSCULAR | Status: DC | PRN

## 2020-08-12 MED ORDER — NALBUPHINE HCL 10 MG/ML IJ SOLN: 5.0000 mg | INTRAMUSCULAR | Status: DC | PRN

## 2020-08-12 MED ORDER — CEFAZOLIN SODIUM-DEXTROSE 2-3 GM-%(50ML) IV SOLR
INTRAVENOUS | Status: DC | PRN
  Administered 2020-08-12: 2 g via INTRAVENOUS

## 2020-08-12 MED ORDER — ACETAMINOPHEN 500 MG PO TABS
1000.0000 mg | ORAL_TABLET | Freq: Four times a day (QID) | ORAL | Status: DC
  Administered 2020-08-12 – 2020-08-14 (×8): 1000 mg via ORAL
  Filled 2020-08-12 (×9): qty 2

## 2020-08-12 MED ORDER — BUPIVACAINE IN DEXTROSE 0.75-8.25 % IT SOLN
INTRATHECAL | Status: DC | PRN
  Administered 2020-08-12: 1.6 mL via INTRATHECAL

## 2020-08-12 MED ORDER — COCONUT OIL OIL: 1.0000 "application " | TOPICAL_OIL | Status: DC | PRN

## 2020-08-12 MED ORDER — MENTHOL 3 MG MT LOZG: 1.0000 | LOZENGE | OROMUCOSAL | Status: DC | PRN

## 2020-08-12 MED ORDER — SODIUM CHLORIDE 0.9 % IR SOLN
Status: DC | PRN
  Administered 2020-08-12: 1000 mL

## 2020-08-12 MED ORDER — ONDANSETRON HCL 4 MG/2ML IJ SOLN: 4.0000 mg | Freq: Three times a day (TID) | INTRAMUSCULAR | Status: DC | PRN

## 2020-08-12 MED ORDER — INSULIN ASPART 100 UNIT/ML ~~LOC~~ SOLN: 0.0000 [IU] | Freq: Every day | SUBCUTANEOUS | Status: DC

## 2020-08-12 MED ORDER — OXYTOCIN-SODIUM CHLORIDE 30-0.9 UT/500ML-% IV SOLN
INTRAVENOUS | Status: AC
  Filled 2020-08-12: qty 500

## 2020-08-12 MED ORDER — DEXAMETHASONE SODIUM PHOSPHATE 4 MG/ML IJ SOLN
INTRAMUSCULAR | Status: DC | PRN
  Administered 2020-08-12: 4 mg via INTRAVENOUS

## 2020-08-12 MED ORDER — SCOPOLAMINE 1 MG/3DAYS TD PT72
MEDICATED_PATCH | TRANSDERMAL | Status: AC
  Filled 2020-08-12: qty 1

## 2020-08-12 MED ORDER — MORPHINE SULFATE (PF) 0.5 MG/ML IJ SOLN
INTRAMUSCULAR | Status: AC
  Filled 2020-08-12: qty 10

## 2020-08-12 MED ORDER — KETOROLAC TROMETHAMINE 30 MG/ML IJ SOLN
30.0000 mg | Freq: Four times a day (QID) | INTRAMUSCULAR | Status: AC | PRN
  Administered 2020-08-12 – 2020-08-13 (×2): 30 mg via INTRAVENOUS
  Filled 2020-08-12 (×2): qty 1

## 2020-08-12 MED ORDER — OXYTOCIN-SODIUM CHLORIDE 30-0.9 UT/500ML-% IV SOLN: 2.5000 [IU]/h | INTRAVENOUS | Status: AC

## 2020-08-12 MED ORDER — ONDANSETRON HCL 4 MG/2ML IJ SOLN
INTRAMUSCULAR | Status: AC
  Filled 2020-08-12: qty 2

## 2020-08-12 MED ORDER — ENOXAPARIN SODIUM 60 MG/0.6ML ~~LOC~~ SOLN
60.0000 mg | SUBCUTANEOUS | Status: DC
  Administered 2020-08-13 – 2020-08-14 (×2): 60 mg via SUBCUTANEOUS
  Filled 2020-08-12 (×4): qty 0.6

## 2020-08-12 MED ORDER — ASPIRIN 81 MG PO CHEW
81.0000 mg | CHEWABLE_TABLET | Freq: Every day | ORAL | Status: DC
  Filled 2020-08-12: qty 1

## 2020-08-12 MED ORDER — PHENYLEPHRINE HCL-NACL 20-0.9 MG/250ML-% IV SOLN
INTRAVENOUS | Status: AC
  Filled 2020-08-12: qty 250

## 2020-08-12 MED ORDER — DIPHENHYDRAMINE HCL 25 MG PO CAPS: 25.0000 mg | ORAL_CAPSULE | ORAL | Status: DC | PRN

## 2020-08-12 MED ORDER — NALOXONE HCL 4 MG/10ML IJ SOLN
1.0000 ug/kg/h | INTRAVENOUS | Status: DC | PRN
  Filled 2020-08-12: qty 5

## 2020-08-12 MED ORDER — OXYCODONE HCL 5 MG/5ML PO SOLN: 5.0000 mg | Freq: Once | ORAL | Status: DC | PRN

## 2020-08-12 MED ORDER — PRENATAL MULTIVITAMIN CH
1.0000 | ORAL_TABLET | Freq: Every day | ORAL | Status: DC
  Administered 2020-08-13 – 2020-08-14 (×2): 1 via ORAL
  Filled 2020-08-12 (×2): qty 1

## 2020-08-12 MED ORDER — IBUPROFEN 800 MG PO TABS
800.0000 mg | ORAL_TABLET | Freq: Three times a day (TID) | ORAL | Status: DC
  Administered 2020-08-13 – 2020-08-14 (×5): 800 mg via ORAL
  Filled 2020-08-12 (×5): qty 1

## 2020-08-12 MED ORDER — TETANUS-DIPHTH-ACELL PERTUSSIS 5-2.5-18.5 LF-MCG/0.5 IM SUSP: 0.5000 mL | Freq: Once | INTRAMUSCULAR | Status: DC

## 2020-08-12 MED ORDER — ENALAPRIL MALEATE 5 MG PO TABS: 10.0000 mg | ORAL_TABLET | Freq: Every day | ORAL | Status: DC

## 2020-08-12 MED ORDER — SENNOSIDES-DOCUSATE SODIUM 8.6-50 MG PO TABS
2.0000 | ORAL_TABLET | ORAL | Status: DC
  Administered 2020-08-13 (×2): 2 via ORAL
  Filled 2020-08-12 (×2): qty 2

## 2020-08-12 MED ORDER — ACETAMINOPHEN 500 MG PO TABS: 1000.0000 mg | ORAL_TABLET | Freq: Four times a day (QID) | ORAL | Status: DC

## 2020-08-12 MED ORDER — CEFAZOLIN SODIUM-DEXTROSE 2-4 GM/100ML-% IV SOLN: 2.0000 g | INTRAVENOUS | Status: DC

## 2020-08-12 MED ORDER — CEFAZOLIN SODIUM-DEXTROSE 2-4 GM/100ML-% IV SOLN
INTRAVENOUS | Status: AC
  Filled 2020-08-12: qty 100

## 2020-08-12 MED ORDER — PHENYLEPHRINE 40 MCG/ML (10ML) SYRINGE FOR IV PUSH (FOR BLOOD PRESSURE SUPPORT)
PREFILLED_SYRINGE | INTRAVENOUS | Status: AC
  Filled 2020-08-12: qty 10

## 2020-08-12 MED ORDER — METOCLOPRAMIDE HCL 5 MG/ML IJ SOLN
INTRAMUSCULAR | Status: AC
  Filled 2020-08-12: qty 2

## 2020-08-12 MED ORDER — DIPHENHYDRAMINE HCL 25 MG PO CAPS: 25.0000 mg | ORAL_CAPSULE | Freq: Four times a day (QID) | ORAL | Status: DC | PRN

## 2020-08-12 MED ORDER — SIMETHICONE 80 MG PO CHEW
80.0000 mg | CHEWABLE_TABLET | ORAL | Status: DC
  Administered 2020-08-13 (×2): 80 mg via ORAL
  Filled 2020-08-12 (×2): qty 1

## 2020-08-12 MED ORDER — SCOPOLAMINE 1 MG/3DAYS TD PT72
1.0000 | MEDICATED_PATCH | Freq: Once | TRANSDERMAL | Status: DC
  Administered 2020-08-12: 1.5 mg via TRANSDERMAL

## 2020-08-12 MED ORDER — SIMETHICONE 80 MG PO CHEW
80.0000 mg | CHEWABLE_TABLET | Freq: Three times a day (TID) | ORAL | Status: DC
  Administered 2020-08-12 – 2020-08-14 (×6): 80 mg via ORAL
  Filled 2020-08-12 (×7): qty 1

## 2020-08-12 MED ORDER — LACTATED RINGERS IV SOLN: INTRAVENOUS | Status: DC

## 2020-08-12 MED ORDER — FENTANYL CITRATE (PF) 100 MCG/2ML IJ SOLN
INTRAMUSCULAR | Status: AC
  Filled 2020-08-12: qty 2

## 2020-08-12 MED ORDER — MEPERIDINE HCL 25 MG/ML IJ SOLN: 6.2500 mg | INTRAMUSCULAR | Status: DC | PRN

## 2020-08-12 MED ORDER — SIMETHICONE 80 MG PO CHEW: 80.0000 mg | CHEWABLE_TABLET | ORAL | Status: DC | PRN

## 2020-08-12 MED ORDER — LEVOTHYROXINE SODIUM 50 MCG PO TABS
50.0000 ug | ORAL_TABLET | Freq: Every day | ORAL | Status: DC
  Administered 2020-08-13 – 2020-08-14 (×2): 50 ug via ORAL
  Filled 2020-08-12 (×2): qty 1

## 2020-08-12 MED ORDER — PHENYLEPHRINE HCL-NACL 20-0.9 MG/250ML-% IV SOLN
INTRAVENOUS | Status: DC | PRN
  Administered 2020-08-12: 60 ug/min via INTRAVENOUS

## 2020-08-12 MED ORDER — KETOROLAC TROMETHAMINE 30 MG/ML IJ SOLN
INTRAMUSCULAR | Status: AC
  Filled 2020-08-12: qty 1

## 2020-08-12 MED ORDER — INSULIN ASPART 100 UNIT/ML ~~LOC~~ SOLN
0.0000 [IU] | Freq: Three times a day (TID) | SUBCUTANEOUS | Status: DC
  Administered 2020-08-12: 5 [IU] via SUBCUTANEOUS

## 2020-08-12 MED ORDER — SODIUM CHLORIDE 0.9% FLUSH: 3.0000 mL | INTRAVENOUS | Status: DC | PRN

## 2020-08-12 MED ORDER — KETOROLAC TROMETHAMINE 30 MG/ML IJ SOLN: 30.0000 mg | Freq: Four times a day (QID) | INTRAMUSCULAR | Status: AC | PRN

## 2020-08-12 MED ORDER — KETOROLAC TROMETHAMINE 30 MG/ML IJ SOLN
30.0000 mg | Freq: Once | INTRAMUSCULAR | Status: AC | PRN
  Administered 2020-08-12: 30 mg via INTRAVENOUS

## 2020-08-12 MED ORDER — HYDROMORPHONE HCL 1 MG/ML IJ SOLN: 0.2500 mg | INTRAMUSCULAR | Status: DC | PRN

## 2020-08-12 MED ORDER — PROMETHAZINE HCL 25 MG/ML IJ SOLN: 6.2500 mg | INTRAMUSCULAR | Status: DC | PRN

## 2020-08-12 MED ORDER — MORPHINE SULFATE (PF) 0.5 MG/ML IJ SOLN
INTRAMUSCULAR | Status: DC | PRN
Start: 2020-08-12 — End: 2020-08-12
  Administered 2020-08-12: .15 mg via INTRATHECAL

## 2020-08-12 MED ORDER — WITCH HAZEL-GLYCERIN EX PADS: 1.0000 "application " | MEDICATED_PAD | CUTANEOUS | Status: DC | PRN

## 2020-08-12 MED ORDER — LACTATED RINGERS IV SOLN: INTRAVENOUS | Status: DC | PRN

## 2020-08-12 MED ORDER — POVIDONE-IODINE 10 % EX SWAB
2.0000 "application " | Freq: Once | CUTANEOUS | Status: AC
  Administered 2020-08-12: 2 via TOPICAL

## 2020-08-12 MED ORDER — OXYTOCIN-SODIUM CHLORIDE 30-0.9 UT/500ML-% IV SOLN
INTRAVENOUS | Status: DC | PRN
  Administered 2020-08-12: 30 [IU] via INTRAVENOUS

## 2020-08-12 MED ORDER — NALBUPHINE HCL 10 MG/ML IJ SOLN: 5.0000 mg | Freq: Once | INTRAMUSCULAR | Status: DC | PRN

## 2020-08-12 MED ORDER — OXYCODONE HCL 5 MG PO TABS
5.0000 mg | ORAL_TABLET | ORAL | Status: DC | PRN
  Administered 2020-08-13: 10 mg via ORAL
  Administered 2020-08-13: 5 mg via ORAL
  Administered 2020-08-13 – 2020-08-14 (×2): 10 mg via ORAL
  Filled 2020-08-12: qty 1
  Filled 2020-08-12 (×3): qty 2

## 2020-08-12 MED ORDER — FENTANYL CITRATE (PF) 100 MCG/2ML IJ SOLN
INTRAMUSCULAR | Status: DC | PRN
  Administered 2020-08-12: 15 ug via INTRATHECAL

## 2020-08-12 MED ORDER — STERILE WATER FOR IRRIGATION IR SOLN
Status: DC | PRN
  Administered 2020-08-12: 1000 mL

## 2020-08-12 MED ORDER — DIPHENHYDRAMINE HCL 50 MG/ML IJ SOLN: 12.5000 mg | INTRAMUSCULAR | Status: DC | PRN

## 2020-08-12 MED ORDER — SODIUM CHLORIDE 0.9 % IV SOLN: INTRAVENOUS | Status: DC | PRN

## 2020-08-12 MED ORDER — DIBUCAINE (PERIANAL) 1 % EX OINT: 1.0000 "application " | TOPICAL_OINTMENT | CUTANEOUS | Status: DC | PRN

## 2020-08-12 MED ORDER — OXYCODONE HCL 5 MG PO TABS: 5.0000 mg | ORAL_TABLET | Freq: Once | ORAL | Status: DC | PRN

## 2020-08-12 SURGICAL SUPPLY — 32 items
BENZOIN TINCTURE PRP APPL 2/3 (GAUZE/BANDAGES/DRESSINGS) ×2 IMPLANT
CLAMP CORD UMBIL (MISCELLANEOUS) ×2 IMPLANT
CLIP FILSHIE TUBAL LIGA STRL (Clip) ×2 IMPLANT
CLOTH BEACON ORANGE TIMEOUT ST (SAFETY) ×2 IMPLANT
DRSG OPSITE POSTOP 4X10 (GAUZE/BANDAGES/DRESSINGS) ×2 IMPLANT
ELECT REM PT RETURN 9FT ADLT (ELECTROSURGICAL) ×2
ELECTRODE REM PT RTRN 9FT ADLT (ELECTROSURGICAL) ×1 IMPLANT
GLOVE BIO SURGEON STRL SZ7.5 (GLOVE) ×2 IMPLANT
GLOVE BIOGEL PI IND STRL 7.0 (GLOVE) ×2 IMPLANT
GLOVE BIOGEL PI INDICATOR 7.0 (GLOVE) ×2
GOWN STRL REUS W/TWL 2XL LVL3 (GOWN DISPOSABLE) ×2 IMPLANT
GOWN STRL REUS W/TWL LRG LVL3 (GOWN DISPOSABLE) ×4 IMPLANT
HEMOSTAT SURGICEL 2X3 (HEMOSTASIS) ×2 IMPLANT
NEEDLE HYPO 22GX1.5 SAFETY (NEEDLE) ×2 IMPLANT
NS IRRIG 1000ML POUR BTL (IV SOLUTION) ×2 IMPLANT
PACK C SECTION WH (CUSTOM PROCEDURE TRAY) ×2 IMPLANT
PAD OB MATERNITY 4.3X12.25 (PERSONAL CARE ITEMS) ×2 IMPLANT
PENCIL SMOKE EVAC W/HOLSTER (ELECTROSURGICAL) ×2 IMPLANT
RTRCTR C-SECT PINK 25CM LRG (MISCELLANEOUS) ×2 IMPLANT
STRIP CLOSURE SKIN 1/2X4 (GAUZE/BANDAGES/DRESSINGS) ×2 IMPLANT
SUT CHROMIC 1 CTX 36 (SUTURE) ×6 IMPLANT
SUT PLAIN 0 NONE (SUTURE) ×2 IMPLANT
SUT VIC AB 1 CT1 36 (SUTURE) ×4 IMPLANT
SUT VIC AB 2-0 CT1 (SUTURE) ×2 IMPLANT
SUT VIC AB 3-0 CT1 27 (SUTURE) ×2
SUT VIC AB 3-0 CT1 TAPERPNT 27 (SUTURE) ×2 IMPLANT
SUT VIC AB 3-0 SH 27 (SUTURE) ×1
SUT VIC AB 3-0 SH 27X BRD (SUTURE) ×1 IMPLANT
SUT VIC AB 4-0 KS 27 (SUTURE) ×2 IMPLANT
TOWEL OR 17X24 6PK STRL BLUE (TOWEL DISPOSABLE) ×2 IMPLANT
TRAY FOLEY W/BAG SLVR 14FR LF (SET/KITS/TRAYS/PACK) ×2 IMPLANT
WATER STERILE IRR 1000ML POUR (IV SOLUTION) ×2 IMPLANT

## 2020-08-12 NOTE — Anesthesia Postprocedure Evaluation (Signed)
Anesthesia Post Note  Patient: [name]  Procedure(s) Performed: CESAREAN SECTION WITH BILATERAL TUBAL LIGATION (N/A Abdomen)     Patient location during evaluation: PACU Anesthesia Type: Spinal Level of consciousness: awake and alert and oriented Pain management: pain level controlled Vital Signs Assessment: post-procedure vital signs reviewed and stable Respiratory status: spontaneous breathing, nonlabored ventilation and respiratory function stable Cardiovascular status: blood pressure returned to baseline and stable Postop Assessment: no headache, no backache, spinal receding and patient able to bend at knees Anesthetic complications: no   No complications documented.  Last Vitals:  Vitals:   08/12/20 1215 08/12/20 1230  BP: 113/65   Pulse: 72 (!) 58  Resp: 18 20  Temp: 36.7 C   SpO2: 96% 97%    Last Pain:  Vitals:   08/12/20 1230  TempSrc:   PainSc: 0-No pain   Pain Goal: Patients Stated Pain Goal: 4 (08/12/20 0828)  LLE Motor Response: Purposeful movement (08/12/20 1230)   RLE Motor Response: Purposeful movement (08/12/20 1230)       Epidural/Spinal Function Cutaneous sensation: Able to Discern Pressure (08/12/20 1230), Patient able to flex knees: Yes (08/12/20 1230), Patient able to lift hips off bed: No (08/12/20 1230), Back pain beyond tenderness at insertion site: No (08/12/20 1230), Progressively worsening motor and/or sensory loss: No (08/12/20 1230), Bowel and/or bladder incontinence post epidural: No (08/12/20 1230)  [name]

## 2020-08-12 NOTE — Op Note (Signed)
[name]   PROCEDURE DATE: 08/12/2020  PREOPERATIVE DIAGNOSES: Intrauterine pregnancy at [redacted]w[redacted]d weeks gestation; elective repeat, Desire for permanent sterilization  POSTOPERATIVE DIAGNOSES: The same, Uterine window  PROCEDURE: Repeat Low Transverse Cesarean Section & Bilateral Tubal Sterilization with Filshie Clips  SURGEON:  Dr. Nettie Elm, MD            Dr. Lynnda Shields, MD  ANESTHESIOLOGY TEAM: Anesthesiologist: [name], DO CRNA: Graciela Husbands, CRNA  INDICATIONS: Kelli Miller is a 39 y.o. [phone] at [redacted]w[redacted]d here for cesarean section secondary to the indications listed under preoperative diagnoses; please see preoperative note for further details.  The risks of surgery were discussed with the patient including but were not limited to: bleeding which may require transfusion or reoperation; infection which may require antibiotics; injury to bowel, bladder, ureters or other surrounding organs; injury to the fetus; need for additional procedures including hysterectomy in the event of a life-threatening hemorrhage; formation of adhesions; placental abnormalities wth subsequent pregnancies; incisional problems; thromboembolic phenomenon and other postoperative/anesthesia complications.    Pt also with undesired fertility and desires permanent sterilization.  Other reversible forms of contraception were discussed with patient; she declines all other modalities. Risks of procedure discussed with patient including but not limited to: risk of regret, permanence of method, bleeding, infection, injury to surrounding organs and need for additional procedures.  Failure risk of 0.5-1% with increased risk of ectopic gestation if pregnancy occurs was also discussed with patient.    The patient concurred with the proposed plan, giving informed written consent for the procedure.    FINDINGS:  Viable female infant in cephalic presentation.  Apgars 7 and 9.  Clear amniotic fluid.  Intact  placenta, three vessel cord.  Normal uterus, fallopian tubes and ovaries bilaterally.  ANESTHESIA: Spinal INTRAVENOUS FLUIDS: 1600 ml   ESTIMATED BLOOD LOSS: 406 ml URINE OUTPUT:  450 ml SPECIMENS: Placenta sent to L&D COMPLICATIONS: None immediate  PROCEDURE IN DETAIL:  The patient preoperatively received intravenous antibiotics (ancef 2g) and had sequential compression devices applied to her lower extremities.  She was then taken to the operating room where spinal anesthesia was administered and was found to be adequate. She was then placed in a dorsal supine position with a leftward tilt, and prepped and draped in a sterile manner.  A foley catheter was placed into her bladder and attached to constant gravity.  After an adequate timeout was performed, a Pfannenstiel skin incision was made with scalpel on her preexisting scar and carried through to the underlying layer of fascia. The fascia was incised in the midline, and this incision was extended bilaterally using the Mayo scissors.  Kocher clamps were applied to the superior aspect of the fascial incision and the underlying rectus muscles were dissected off bluntly and sharply.  A similar process was carried out on the inferior aspect of the fascial incision. The rectus muscles were separated in the midline and the peritoneum was entered bluntly. The Alexis self-retaining retractor was introduced into the abdominal cavity.  Attention was turned to the lower uterine segment where a bladder flap was created, and then a low transverse hysterotomy was made with a scalpel and extended bilaterally bluntly. Of note, a uterine window along the left lower uterine segment was appreciated. The infant was successfully delivered, the cord was clamped and cut after one minute, and the infant was handed over to the awaiting neonatology team. Uterine massage was then administered, and the placenta delivered intact with a three-vessel  cord. The uterus was then cleared  of clots and debris.  The hysterotomy was closed with 0 Chromic in a running locked fashion.  Multiple interrupted sutures of 0 Chromic were also placed with good hemostasis throughout.  The pelvis was cleared of all clot and debris. Hemostasis was confirmed on all surfaces.    A moist lap pad was used to move omentum and bowel away until the left fallopian tube was identified and grasped with a Babcock clamp, and followed out to the fimbriated end. The left Fallopian tube was identified by tracing out to the fimbraie, grasped with the Babcock clamps. An avascular midsection of the tube approximately 3-4cm from the cornua was grasped with the babcock clamps and the filshie clip was applied, taking care to incorporate the entire tube.  Attention was then turned to the right fallopian tube after confirmation by tracing the tube out to the fimbriae. The same procedure was then performed on the right Fallopian tube, with excellent hemostasis was noted from both BTL sites.   The Alexis retractor was removed.  The peritoneum was closed with a 0 Vicryl running stitch and the rectus muscles were reapproximated using 0 Vicryl interrupted stitches. The fascia was then closed using 0 Vicryl in a running fashion.  The subcutaneous layer was irrigated, reapproximated with 2-0 plain gut interrupted stitches, and the skin was closed with a 4-0 Vicryl subcuticular stitch. The patient tolerated the procedure well. Sponge, instrument and needle counts were correct x 3.  She was taken to the recovery room in stable condition.   Sheila Oats, MD OB Fellow, [facility] 08/12/2020 11:36 AM

## 2020-08-12 NOTE — H&P (Signed)
OBSTETRIC ADMISSION HISTORY AND PHYSICAL  Kelli Miller is a 39 y.o. female [phone] with IUP at 9388w0d by LMP presenting for scheduled repeat Cesarean with BTL. She reports +FMs, No LOF, no VB, no blurry vision, headaches or peripheral edema, and RUQ pain.  She plans on bottle feeding. She requests BTL with Filshie clips for birth control.  [facility]   Dating: By LMP --->  Estimated Date of Delivery: 09/02/20  Sono:  @[redacted]w[redacted]d , CWD, normal anatomy, transverse presentation, unstable lie, 2506g, 54% EFW  Prenatal History/Complications:  -cHTN (on amlodipine 10mg  daily, labetalol 200 BID) -Psoriasis (clobetasol cream 0.05% prn) -Antiphospholipid syndrome (lovenox 100mg  daily in pregnancy) -Hypothyroidism (synthroid 50mcg daily in and outside of pregnancy) -Type 2 Diabetes (MTF 1g BID in and outside of pregnancy; NPH 60u in AM & 96u in PM + Novolog TIDAC) -h/o Preeclampsia with severe features -Polycystic Ovarian Syndrome   Past Medical History: Past Medical History:  Diagnosis Date  . Antiphospholipid antibody syndrome (HCC)    per pt dx when had hx recurrent miscarriage's  . GERD (gastroesophageal reflux disease)   . History of trichomoniasis 2019  . Hypertension    followed by pcp  . Hypothyroidism    followed by   . PCOS (polycystic ovarian syndrome) 2015  . Psoriasis    dermatologist--- dr Rickard Rhymesfieldman [facility]  . Retained intrauterine contraceptive device (IUD)   . Type 2 diabetes mellitus (HCC)    followed by pcp    Past Surgical History: Past Surgical History:  Procedure Laterality Date  . CESAREAN SECTION N/A 06/23/2018   Procedure: CESAREAN SECTION;  Surgeon: Tereso NewcomerAnyanwu, Ugonna A, MD;  Location: [facility];  Service: Obstetrics;  Laterality: N/A;  . DILATION AND EVACUATION N/A 04/05/2014   Procedure: DILATATION AND EVACUATION with genetic studies;  Surgeon: Mitchel HonourMegan Morris, DO;  Location: [facility] ORS;  Service: Gynecology;  Laterality: N/A;  .  IUD REMOVAL N/A 08/15/2019   Procedure: INTRAUTERINE DEVICE (IUD) REMOVAL;  Surgeon: Allie Bossierove, Myra C, MD;  [facility];  Service: Gynecology;  Laterality: N/A;    Obstetrical History: OB History    Gravida  5   Para  1   Term  1   Preterm      AB  3   Living  1     SAB  3   TAB      Ectopic      Multiple  0   Live Births  1           Social History Social History   Socioeconomic History  . Marital status: Married    Spouse name: Not on file  . Number of children: Not on file  . Years of education: Not on file  . Highest education level: Not on file  Occupational History  . Occupation: Nurse  Tobacco Use  . Smoking status: Former Smoker    Years: 10.00    Types: Cigarettes    Quit date: 10/03/2013    Years since quitting: 6.8  . Smokeless tobacco: Never Used  Vaping Use  . Vaping Use: Never used  Substance and Sexual Activity  . Alcohol use: No  . Drug use: Never  . Sexual activity: Yes    Birth control/protection: None  Other Topics Concern  . Not on file  Social History Narrative  . Not on file   Social Determinants of Health   Financial Resource Strain:   . Difficulty of Paying Living Expenses: Not  on file  Food Insecurity:   . Worried About Programme researcher, broadcasting/film/video in the Last Year: Not on file  . Ran Out of Food in the Last Year: Not on file  Transportation Needs:   . Lack of Transportation (Medical): Not on file  . Lack of Transportation (Non-Medical): Not on file  Physical Activity:   . Days of Exercise per Week: Not on file  . Minutes of Exercise per Session: Not on file  Stress:   . Feeling of Stress : Not on file  Social Connections:   . Frequency of Communication with Friends and Family: Not on file  . Frequency of Social Gatherings with Friends and Family: Not on file  . Attends Religious Services: Not on file  . Active Member of Clubs or Organizations: Not on file  . Attends Banker Meetings:  Not on file  . Marital Status: Not on file    Family History: Family History  Problem Relation Age of Onset  . Lung cancer Father   . Lung cancer Mother   . Heart attack Mother   . Hypertension Mother     Allergies: Allergies  Allergen Reactions  . Bactrim [Sulfamethoxazole-Trimethoprim] Rash    Medications Prior to Admission  Medication Sig Dispense Refill Last Dose  . acetaminophen (TYLENOL) 500 MG tablet Take 1,000 mg by mouth every 6 (six) hours as needed for mild pain or moderate pain.   08/11/2020 at Unknown time  . amLODipine (NORVASC) 10 MG tablet TAKE ONE TABLET BY MOUTH DAILY (Patient taking differently: Take 10 mg by mouth at bedtime. ) 90 tablet 1 08/11/2020 at Unknown time  . aspirin EC 81 MG tablet Take 81 mg by mouth at bedtime.    Past Week at Unknown time  . Clobetasol Prop Emollient Base 0.05 % emollient cream Apply 1 application topically daily as needed (Psoriasis).    08/11/2020 at Unknown time  . cyanocobalamin (,VITAMIN B-12,) 1000 MCG/ML injection Inject 1 mL (1,000 mcg total) into the muscle every 30 (thirty) days. 1 mL 5 08/11/2020 at Unknown time  . enoxaparin (LOVENOX) 100 MG/ML injection Inject 1 mL (100 mg total) into the skin daily. (Patient taking differently: Inject 100 mg into the skin at bedtime. ) 30 mL 2 Past Week at Unknown time  . EUTHYROX 50 MCG tablet TAKE 1 TABLET BY MOUTH ONCE DAILY BEFORE BREAKFAST (Patient taking differently: Take 50 mcg by mouth daily before breakfast. ) 90 tablet 1 08/12/2020 at Unknown time  . insulin aspart (NOVOLOG) 100 UNIT/ML injection 9 units in AM and 9 units @ bedtime (Patient taking differently: Inject 7-13 Units into the skin See admin instructions. Take 11 units in the morning, 7 units at lunch and 13 units at bedtime) 10 mL 12 08/11/2020 at Unknown time  . insulin NPH Human (NOVOLIN N) 100 UNIT/ML injection Inject 0.5 mLs (50 Units total) into the skin 2 (two) times daily before a meal. Take 50 units  subcutaneously with breakfast and 54 units at bedtime (Patient taking differently: Inject 66 Units into the skin See admin instructions. Take 66 units subcutaneously with breakfast and 96 units at bedtime) 20 mL 3 08/11/2020 at Unknown time  . labetalol (NORMODYNE) 200 MG tablet Take 1 tablet (200 mg total) by mouth 2 (two) times daily. 180 tablet 3 08/12/2020 at Unknown time  . metFORMIN (GLUCOPHAGE) 1000 MG tablet Take 1 tablet (1,000 mg total) by mouth 2 (two) times daily with a meal. 180 tablet 1 08/11/2020 at  Unknown time  . omeprazole (PRILOSEC) 20 MG capsule Take 1 capsule (20 mg total) by mouth daily. (Patient taking differently: Take 20 mg by mouth at bedtime. ) 90 capsule 3 08/11/2020 at Unknown time  . progesterone (PROMETRIUM) 200 MG capsule Take 1 capsule (200 mg total) by mouth daily. (Patient taking differently: Take 200 mg by mouth at bedtime. ) 30 capsule 3 08/11/2020 at Unknown time  . STELARA 90 MG/ML SOSY injection Inject 90 mg into the skin See admin instructions. EVERY 12 WEEKS   Past Month at Unknown time  . Clobetasol Propionate 0.05 % shampoo Apply 1 application topically daily as needed (Psoriasis).    More than a month at Unknown time  . Prenatal Vit-Fe Fumarate-FA (PRENATAL VITAMIN PO) Take 1 tablet by mouth at bedtime.         Review of Systems   All systems reviewed and negative except as stated in HPI  Last menstrual period 11/27/2019. General appearance: alert, cooperative and appears stated age Lungs: normal WOB Heart: regular rate Abdomen: soft, non-tender Extremities: Homans sign is negative, no sign of DVT Presentation: unsure Fetal monitoring: FHT 147 on Doppler   Prenatal labs: ABO, Rh: --/--/AB POS (10/15 1040) Antibody: NEG (10/15 1040) Rubella: 7.13 (03/18 1235) RPR: NON REACTIVE (10/15 1032)  HBsAg: NON-REACTIVE (03/18 1235)  HIV: NON-REACTIVE (08/09 1143)  GBS:   positive A1c 5.5% (7 months ago in first trimester) Genetic screening  declined Anatomy US wnl  Prenatal Transfer Tool  Maternal Diabetes: Yes:  Diabetes Type:  Pre-pregnancy (A1c 5.5% in first trimester, MTF + insulin in pregnancy) Genetic Screening: Declined Maternal Ultrasounds/Referrals: Normal Fetal Ultrasounds or other Referrals:  Fetal echo, Referred to Materal Fetal Medicine  Maternal Substance Abuse:  No Significant Maternal Medications:  Meds include: Syntroid Other: MTF, insulin, clobetasol cream prn, stelara patch, amlodipine, labetalol Significant Maternal Lab Results: Group B Strep positive  No results found for this or any previous visit (from the past 24 hour(s)).  Patient Active Problem List   Diagnosis Date Noted  . Unwanted fertility 03/07/2020  . Left ankle pain 02/12/2020  . Preexisting diabetes complicating pregnancy, antepartum 01/11/2020  . Hypothyroid in pregnancy, antepartum 01/11/2020  . History of severe preeclampsia superimposed on chronic hypertension in prior pregnancy 01/11/2020  . History of cesarean section for failure to progress 01/11/2020  . Maternal morbid obesity, antepartum (HCC) 01/11/2020  . Supervision of high risk pregnancy, antepartum 01/01/2020  . Hypothyroidism 08/30/2018  . Diabetes mellitus (HCC) 08/30/2018  . B12 deficiency 08/30/2018  . Vitamin D deficiency 08/30/2018  . Family history of premature CAD 08/30/2018  . History of antiphospholipid syndrome 08/30/2018  . PCOS (polycystic ovarian syndrome) 01/13/2018  . GERD (gastroesophageal reflux disease) 01/13/2018  . Psoriasis 01/13/2018  . Preexisting hypertension complicating pregnancy, antepartum 12/29/2017  . Advanced maternal age in multigravida 12/27/2017    Assessment/Plan:  AMRITHA YORKE is a 39 y.o. [id number] at [redacted]w[redacted]d here for scheduled repeat Cesarean.  #Scheduled Repeat Cesarean +BTL: Pt with h/o Cesarean x1 for failure to progress. Pt confirms desire for repeat Cesarean on admission today. Pt reports desires for use of "clips" for BTL.  Consent as noted below.  The risks of cesarean section were discussed with the patient including but were not limited to: bleeding which may require transfusion or reoperation; infection which may require antibiotics; injury to bowel, bladder, ureters or other surrounding organs; injury to the fetus; need for additional procedures including hysterectomy in the event of a life-threatening hemorrhage; placental  abnormalities wth subsequent pregnancies, incisional problems, thromboembolic phenomenon and other postoperative/anesthesia complications.  Patient also desires permanent sterilization.  Other reversible forms of contraception were discussed with patient; she declines all other modalities. Risks of procedure discussed with patient including but not limited to: risk of regret, permanence of method, bleeding, infection, injury to surrounding organs and need for additional procedures.  Failure risk of about 1% with increased risk of ectopic gestation if pregnancy occurs was also discussed with patient.  Also discussed possibility of post-tubal pain syndrome. The patient concurred with the proposed plan, giving informed written consent for the procedures.  Patient has been NPO since last night; she will remain NPO for procedure. Anesthesia and OR aware.  Preoperative prophylactic antibiotics and SCDs ordered on call to the OR.  To OR when ready.  #Pain: Per anesthesia #FWB: FHT 147 on Doppler; pt reports +FM #ID: GBS+; plan for ancef 2g prior to OR #MOF: bottle #MOC: desires BTL with Filshie clips as noted above #Circ: desired #cHTN  H/o Preeclampsia with SF in prior pregnancy: continued on amlodipine 10mg  daily, labetalol 200 BID in pregnancy. #T2DM: Last A1c 5.5% in first trimester. MTF 1g BID in and outside of pregnancy; NPH 60u in AM & 96u in PM + Novolog TIDAC. Plan to monitor FBG on POD#1 & resume MTF on discharge. #Antiphospholipid syndrome: no h/o DVT. Continued on lovenox 100mg  daily in  pregnancy. Plan to continue lovenox ppx in PP period. #Hypothyroidism: continued on synthroid daily in and outside of pregnancy. Plan to continue PP with repeat TFTs in 6-8 weeks PP. #Psoriasis: continued on stelara patch and clobetasol cream prn in pregnancy. Plan to continued in PP period. #AMA: pt declined all genetic testing.  , MD  08/12/2020, 8:28 AM

## 2020-08-12 NOTE — Anesthesia Preprocedure Evaluation (Addendum)
Anesthesia Evaluation  Patient identified by MRN, date of birth, ID band Patient awake    Reviewed: Allergy & Precautions, NPO status , Patient's Chart, lab work & pertinent test results  Airway Mallampati: II  TM Distance: >3 FB Neck ROM: Full    Dental no notable dental hx.    Pulmonary former smoker,  Quit smoking 2014   Pulmonary exam normal breath sounds clear to auscultation       Cardiovascular hypertension, negative cardio ROS Normal cardiovascular exam Rhythm:Regular Rate:Normal     Neuro/Psych negative neurological ROS  negative psych ROS   GI/Hepatic Neg liver ROS, GERD  Medicated and Controlled,  Endo/Other  diabetes, Type 2, Oral Hypoglycemic Agents, Insulin DependentHypothyroidism Morbid obesityBMI 49  Renal/GU negative Renal ROS  negative genitourinary   Musculoskeletal negative musculoskeletal ROS (+)   Abdominal (+) + obese,   Peds negative pediatric ROS (+)  Hematology  (+) Blood dyscrasia, , Antiphospholipid syn- on lovenox, LD: >48h ago hct 34.2, plt 220   Anesthesia Other Findings   Reproductive/Obstetrics (+) Pregnancy 1 prior section 2019                            Anesthesia Physical Anesthesia Plan  ASA: III  Anesthesia Plan: Spinal   Post-op Pain Management:    Induction:   PONV Risk Score and Plan: 3 and Ondansetron, Dexamethasone and Treatment may vary due to age or medical condition  Airway Management Planned: Natural Airway and Nasal Cannula  Additional Equipment: None  Intra-op Plan:   Post-operative Plan:   Informed Consent: I have reviewed the patients History and Physical, chart, labs and discussed the procedure including the risks, benefits and alternatives for the proposed anesthesia with the patient or authorized representative who has indicated his/her understanding and acceptance.       Plan Discussed with: CRNA  Anesthesia Plan  Comments:        Anesthesia Quick Evaluation

## 2020-08-12 NOTE — Progress Notes (Signed)
Patient will call for CBG when meal tray arrives

## 2020-08-12 NOTE — Discharge Summary (Signed)
Postpartum Discharge Summary  Date of Service updated10/20/21    Patient Name: Kelli Miller DOB: 1981-08-28 MRN: [id number]  Date of admission: 08/12/2020 Delivery date:08/12/2020  Delivering provider: Chancy Milroy  Date of discharge: 08/14/2020  Admitting diagnosis: History of cesarean delivery [Z98.891] Intrauterine pregnancy: [redacted]w[redacted]d    Secondary diagnosis:  Active Problems:   Advanced maternal age in multigravida   Preexisting hypertension complicating pregnancy, antepartum   PCOS (polycystic ovarian syndrome)   GERD (gastroesophageal reflux disease)   Psoriasis   Hypothyroidism   Diabetes mellitus ([place]   History of antiphospholipid syndrome   Hypothyroid in pregnancy, antepartum   History of severe preeclampsia superimposed on chronic hypertension in prior pregnancy   History of cesarean section for failure to progress   M[place] morbid obesity, [place]partum ([place]   History of cesarean delivery  Additional problems: as noted above   Discharge diagnosis: Repeat Cesarean delivery                                Post partum procedures:postpartum tubal ligation Augmentation: N/A Complications: None  Hospital course: Sceduled C/S   39y.o. yo [id number] 381w0das admitted to the hospital 08/12/2020 for scheduled cesarean section with the following indication:Elective Repeat.Delivery details are as follows:  Membrane Rupture Time/Date: 10:30 AM ,08/12/2020   Delivery Method:C-Section, Low Transverse  Details of operation can be found in separate operative note.  Patient had an uncomplicated postpartum course.  She is ambulating, tolerating a regular diet, passing flatus, and urinating well. Patient is discharged home in stable condition on  08/14/20        Newborn Data: Birth date:08/12/2020  Birth time:10:31 AM  Gender:Female  Living status:Living  Apgars:7 ,9  Weight:2960 g     Magnesium Sulfate received: No BMZ received: No Rhophylac:No MMR:No T-DaP:Given  prenatally Flu: No Transfusion:No  Physical exam  Vitals:   08/13/20 2003 08/14/20 0550 08/14/20 1024 08/14/20 1436  BP: (!) 148/64 (!) 142/75 (!) 149/71 (!) 159/83  Pulse: 75 80 82 90  Resp: '16 16  20  ' Temp: 98 F (36.7 C) 98.8 F (37.1 C)  99.3 F (37.4 C)  TempSrc: Oral Oral  Oral  SpO2: 100% 99%  100%  Weight:      Height:       General: alert, cooperative and no distress Lochia: appropriate Uterine Fundus: firm Incision: Dressing is clean, dry, and intact DVT Evaluation: No evidence of DVT seen on physical exam. Negative Homan's sign. No cords or calf tenderness. No significant calf/ankle edema. Labs: Lab Results  Component Value Date   WBC 12.7 (H) 08/13/2020   HGB 9.5 (L) 08/13/2020   HCT 29.0 (L) 08/13/2020   MCV 93.5 08/13/2020   PLT 194 08/13/2020   CMP Latest Ref Rng & Units 08/03/2020  Glucose 70 - 99 mg/dL 137(H)  BUN 6 - 20 mg/dL 8  Creatinine 0.44 - 1.00 mg/dL 0.51  Sodium 135 - 145 mmol/L 136  Potassium 3.5 - 5.1 mmol/L 3.6  Chloride 98 - 111 mmol/L 106  CO2 22 - 32 mmol/L 20(L)  Calcium 8.9 - 10.3 mg/dL 9.1  Total Protein 6.5 - 8.1 g/dL 6.2(L)  Total Bilirubin 0.3 - 1.2 mg/dL <0.1(L)  Alkaline Phos 38 - 126 U/L 66  AST 15 - 41 U/L 13(L)  ALT 0 - 44 U/L 12   Edinburgh Score: Edinburgh Postnatal Depression Scale Screening Tool 08/13/2020  I have been  able to laugh and see the funny side of things. 0  I have looked forward with enjoyment to things. 0  I have blamed myself unnecessarily when things went wrong. 0  I have been anxious or worried for no good reason. 2  I have felt scared or panicky for no good reason. 0  Things have been getting on top of me. 0  I have been so unhappy that I have had difficulty sleeping. 0  I have felt sad or miserable. 0  I have been so unhappy that I have been crying. 0  The thought of harming myself has occurred to me. 0  Edinburgh Postnatal Depression Scale Total 2     After visit meds:  Allergies as of  08/14/2020      Reactions   Bactrim [sulfamethoxazole-trimethoprim] Rash      Medication List    STOP taking these medications   amLODipine 10 MG tablet Commonly known as: NORVASC   aspirin EC 81 MG tablet   insulin aspart 100 UNIT/ML injection Commonly known as: novoLOG   insulin NPH Human 100 UNIT/ML injection Commonly known as: NOVOLIN N   labetalol 200 MG tablet Commonly known as: NORMODYNE   progesterone 200 MG capsule Commonly known as: PROMETRIUM     TAKE these medications   acetaminophen 500 MG tablet Commonly known as: TYLENOL Take 1,000 mg by mouth every 6 (six) hours as needed for mild pain or moderate pain.   Clobetasol Prop Emollient Base 0.05 % emollient cream Apply 1 application topically daily as needed (Psoriasis).   Clobetasol Propionate 0.05 % shampoo Apply 1 application topically daily as needed (Psoriasis).   cyanocobalamin 1000 MCG/ML injection Commonly known as: (VITAMIN B-12) Inject 1 mL (1,000 mcg total) into the muscle every 30 (thirty) days.   enalapril 20 MG tablet Commonly known as: VASOTEC Take 1 tablet (20 mg total) by mouth daily.   enoxaparin 100 MG/ML injection Commonly known as: Lovenox Inject 1 mL (100 mg total) into the skin daily. What changed: when to take this   enoxaparin 60 MG/0.6ML injection Commonly known as: LOVENOX Inject 0.6 mLs (60 mg total) into the skin daily. Start taking on: August 15, 2020 What changed: You were already taking a medication with the same name, and this prescription was added. Make sure you understand how and when to take each.   ibuprofen 800 MG tablet Commonly known as: ADVIL Take 1 tablet (800 mg total) by mouth every 8 (eight) hours.   levothyroxine 50 MCG tablet Commonly known as: Euthyrox Take 1 tablet (50 mcg total) by mouth daily before breakfast. What changed: See the new instructions.   levothyroxine 50 MCG tablet Commonly known as: SYNTHROID Take 1 tablet (50 mcg total)  by mouth daily at 6 (six) AM. Start taking on: August 15, 2020 What changed: You were already taking a medication with the same name, and this prescription was added. Make sure you understand how and when to take each.   metFORMIN 1000 MG tablet Commonly known as: GLUCOPHAGE Take 1 tablet (1,000 mg total) by mouth 2 (two) times daily with a meal. What changed: Another medication with the same name was added. Make sure you understand how and when to take each.   metFORMIN 500 MG tablet Commonly known as: Glucophage Take 1 tablet (500 mg total) by mouth 2 (two) times daily with a meal. What changed: You were already taking a medication with the same name, and this prescription was added. Make sure  you understand how and when to take each.   metFORMIN 500 MG tablet Commonly known as: Glucophage Take 1 tablet (500 mg total) by mouth 2 (two) times daily with a meal. What changed: You were already taking a medication with the same name, and this prescription was added. Make sure you understand how and when to take each.   metFORMIN 500 MG tablet Commonly known as: Glucophage Take 1 tablet (500 mg total) by mouth 2 (two) times daily with a meal. What changed: You were already taking a medication with the same name, and this prescription was added. Make sure you understand how and when to take each.   metFORMIN 500 MG tablet Commonly known as: Glucophage Take 1 tablet (500 mg total) by mouth 2 (two) times daily with a meal. What changed: You were already taking a medication with the same name, and this prescription was added. Make sure you understand how and when to take each.   omeprazole 20 MG capsule Commonly known as: PRILOSEC Take 1 capsule (20 mg total) by mouth daily. What changed: when to take this   Ozempic (0.25 or 0.5 MG/DOSE) 2 MG/1.5ML Sopn Generic drug: Semaglutide(0.25 or 0.5MG/DOS) Inject 0.25 mg into the skin once a week. Inject 0.25 mg into the skin once a week for 30  days, then 0.5 mg once a week.   PRENATAL VITAMIN PO Take 1 tablet by mouth at bedtime.   Stelara 90 MG/ML Sosy injection Generic drug: ustekinumab Inject 90 mg into the skin See admin instructions. EVERY 12 WEEKS        Discharge home in stable condition Infant Feeding: Breast Infant Disposition:home with mother Discharge instruction: per After Visit Summary and Postpartum booklet. Activity: Advance as tolerated. Pelvic rest for 6 weeks.  Diet: routine diet Future Appointments: Future Appointments  Date Time Provider [place]  08/16/2020  3:15 PM [facility] NURSE [facility]  08/16/2020  3:30 PM [facility] US3 [facility]  08/19/2020  1:15 PM Guss Bunde, MD [facility]  08/22/2020  3:00 PM [facility] NURSE [facility]  08/22/2020  3:15 PM [facility]  09/23/2020  1:15 PM Gala Romney, Fredderick Phenix, MD [facility]   Follow up Visit:  [place] [facility] [place] [facility] Follow up.   Specialty: Obstetrics and Gynecology Why: In 1 week for blood pressure and incision check, then in 4 weeks for postpartum visit.  Contact information: [place], [place] [phone]             Message sent to [facility] clinic on 08/12/20 to schedule PP appt and 1 week f/u appts.  Please schedule this patient for a In person postpartum visit in 4 weeks with the following provider: MD. Additional Postpartum F/U:Incision check 1 week, BP check 1 week and repeat TFTs at 6-8 weeks  High risk pregnancy complicated by: cHTN, h/o PreE with severe features, T2DM (discharged on MTF), Hypothyroidism (synthroid 41mg daily), h/o antiphospholipid syndrome (on lovenox 663mdaily x6wk in PP period), Psoriasis (on stelara) Delivery mode:  C-Section, Low Transverse  Anticipated Birth Control:  [facility]2020 LiFatima BlankCNM

## 2020-08-12 NOTE — Discharge Instructions (Signed)

## 2020-08-12 NOTE — Transfer of Care (Signed)
Immediate Anesthesia Transfer of Care Note  Patient: Kelli Miller  Procedure(s) Performed: CESAREAN SECTION WITH BILATERAL TUBAL LIGATION (N/A Abdomen)  Patient Location: PACU  Anesthesia Type:Spinal  Level of Consciousness: awake, alert , oriented and patient cooperative  Airway & Oxygen Therapy: Patient Spontanous Breathing  Post-op Assessment: Report given to RN  Post vital signs: Reviewed and stable  Last Vitals:  Vitals Value Taken Time  BP 125/65 08/12/20 1132  Temp    Pulse 66 08/12/20 1135  Resp 14 08/12/20 1135  SpO2 97 % 08/12/20 1135  Vitals shown include unvalidated device data.  Last Pain:  Vitals:   08/12/20 0828  TempSrc: Oral  PainSc: 0-No pain      Patients Stated Pain Goal: 4 (08/12/20 9935)  Complications: No complications documented.

## 2020-08-12 NOTE — Anesthesia Procedure Notes (Addendum)
Spinal  Patient location during procedure: OR Start time: 08/12/2020 9:50 AM End time: 08/12/2020 9:56 AM Staffing Performed: other anesthesia staff  Anesthesiologist: [name], DO Preanesthetic Checklist Completed: patient identified, risks and benefits discussed, surgical consent, monitors and equipment checked, pre-op evaluation and timeout performed Spinal Block Patient position: sitting Prep: ChloraPrep Patient monitoring: continuous pulse ox and blood pressure Approach: midline Location: L3-4 Needle Needle type: Pencan  Needle gauge: 24 G Assessment Sensory level: T4

## 2020-08-13 LAB — CBC
HCT: 29 % — ABNORMAL LOW (ref 36.0–46.0)
Hemoglobin: 9.5 g/dL — ABNORMAL LOW (ref 12.0–15.0)
MCH: 30.6 pg (ref 26.0–34.0)
MCHC: 32.8 g/dL (ref 30.0–36.0)
MCV: 93.5 fL (ref 80.0–100.0)
Platelets: 194 10*3/uL (ref 150–400)
RBC: 3.1 MIL/uL — ABNORMAL LOW (ref 3.87–5.11)
RDW: 14.5 % (ref 11.5–15.5)
WBC: 12.7 10*3/uL — ABNORMAL HIGH (ref 4.0–10.5)
nRBC: 0 % (ref 0.0–0.2)

## 2020-08-13 LAB — BIRTH TISSUE RECOVERY COLLECTION (PLACENTA DONATION)

## 2020-08-13 LAB — GLUCOSE, CAPILLARY
Glucose-Capillary: 109 mg/dL — ABNORMAL HIGH (ref 70–99)
Glucose-Capillary: 111 mg/dL — ABNORMAL HIGH (ref 70–99)
Glucose-Capillary: 118 mg/dL — ABNORMAL HIGH (ref 70–99)
Glucose-Capillary: 97 mg/dL (ref 70–99)

## 2020-08-13 MED ORDER — FERROUS SULFATE 325 (65 FE) MG PO TABS
325.0000 mg | ORAL_TABLET | ORAL | Status: DC
  Administered 2020-08-13: 325 mg via ORAL
  Filled 2020-08-13: qty 1

## 2020-08-13 MED ORDER — AMLODIPINE BESYLATE 5 MG PO TABS: 10.0000 mg | ORAL_TABLET | Freq: Every day | ORAL | Status: DC

## 2020-08-13 MED ORDER — ENALAPRIL MALEATE 5 MG PO TABS
10.0000 mg | ORAL_TABLET | Freq: Every day | ORAL | Status: DC
  Administered 2020-08-13 – 2020-08-14 (×2): 10 mg via ORAL
  Filled 2020-08-13 (×2): qty 2

## 2020-08-13 MED ORDER — VITAMIN C 250 MG PO TABS
250.0000 mg | ORAL_TABLET | ORAL | Status: DC
  Administered 2020-08-13: 250 mg via ORAL
  Filled 2020-08-13: qty 1

## 2020-08-13 NOTE — Progress Notes (Signed)
Patient to call for [facility] when lunch tray arrives

## 2020-08-13 NOTE — Progress Notes (Addendum)
Subjective: POD#1: Cesarean Delivery  Patient is doing well without complaints. Ambulating without difficulty. Voiding and passing flatus. Tolerating PO. Abdominal pain improved. Vaginal bleeding decreased.  Objective: Vital signs in last 24 hours: Temp:  [97.5 F (36.4 C)-98.4 F (36.9 C)] 98.3 F (36.8 C) (10/19 0522) Pulse Rate:  [51-87] 57 (10/19 0522) Resp:  [16-27] 18 (10/19 0601) BP: (112-152)/(60-76) 130/69 (10/19 0522) SpO2:  [96 %-100 %] 97 % (10/19 0601) Weight:  [116.6 kg] 116.6 kg (10/18 0828)  Physical Exam:  General: alert, cooperative and no distress Lochia: appropriate Uterine Fundus: firm Incision: honeycomb dressing with some dried blood, not currently bleeding DVT Evaluation: No evidence of DVT seen on physical exam.  Recent Labs    08/13/20 0527  HGB 9.5*  HCT 29.0*    Assessment/Plan:  POD#1 rLTCS/PP BTL  -doing well, meeting postpartum milestones  -bottle feeding  -circ already ordered/consented  #Acute blood loss anemia  -hgb 11.2>9.5, PO iron and vitamin c started  #cHTN  -previously on amlodipine 10 mg and labetalol 200 mg BID   -will start enalapril 10 mg daily given proteinuria prior to pregnancy and  known DM history, consider adding amlodipine if additional agent needed  -BP currently well controlled  #DM type 2  -on metformin 1000 mg BID and ozempic prior to pregnancy  -SSI while inpatient  -will plan for discharge on home regimen  -diet switched to carb modified  #hypothyroid  -synthroid 50 mcg daily prior to and during pregnancy  -last tsh 06/03/20 nml, repeat at 6 week post partum appt  #Antiphospholipid antibody positive #Morbid obesity  -lovenox 60 mg qd x6 weeks  Alric Seton 08/13/2020, 6:40 AM

## 2020-08-14 LAB — GLUCOSE, CAPILLARY: Glucose-Capillary: 115 mg/dL — ABNORMAL HIGH (ref 70–99)

## 2020-08-14 MED ORDER — METFORMIN HCL 500 MG PO TABS: 500.0000 mg | ORAL_TABLET | Freq: Two times a day (BID) | ORAL | 11 refills | Status: DC

## 2020-08-14 MED ORDER — ASPIRIN EC 81 MG PO TBEC
81.0000 mg | DELAYED_RELEASE_TABLET | Freq: Every day | ORAL | Status: DC
  Administered 2020-08-14: 81 mg via ORAL
  Filled 2020-08-14: qty 1

## 2020-08-14 MED ORDER — OZEMPIC (0.25 OR 0.5 MG/DOSE) 2 MG/1.5ML ~~LOC~~ SOPN: 0.2500 mg | PEN_INJECTOR | SUBCUTANEOUS | 2 refills | Status: AC

## 2020-08-14 MED ORDER — ENOXAPARIN SODIUM 60 MG/0.6ML ~~LOC~~ SOLN: 60.0000 mg | SUBCUTANEOUS | 30 refills | Status: DC

## 2020-08-14 MED ORDER — LEVOTHYROXINE SODIUM 50 MCG PO TABS: 50.0000 ug | ORAL_TABLET | Freq: Every day | ORAL | 2 refills | Status: DC

## 2020-08-14 MED ORDER — IBUPROFEN 800 MG PO TABS: 800.0000 mg | ORAL_TABLET | Freq: Three times a day (TID) | ORAL | 0 refills | Status: AC

## 2020-08-14 MED ORDER — ENALAPRIL MALEATE 5 MG PO TABS
10.0000 mg | ORAL_TABLET | Freq: Once | ORAL | Status: AC
  Administered 2020-08-14: 10 mg via ORAL
  Filled 2020-08-14: qty 2

## 2020-08-14 MED ORDER — ENALAPRIL MALEATE 20 MG PO TABS: 20.0000 mg | ORAL_TABLET | Freq: Every day | ORAL | 11 refills | Status: DC

## 2020-08-14 NOTE — Progress Notes (Signed)
Subjective: POD#1: Cesarean Delivery  Patient is doing well without complaints. Ambulating without difficulty. Voiding and passing flatus. Tolerating PO. Abdominal pain improved. Vaginal bleeding decreased.  Objective: Vital signs in last 24 hours: Temp:  [98 F (36.7 C)-98.8 F (37.1 C)] 98.8 F (37.1 C) (10/20 0550) Pulse Rate:  [64-84] 80 (10/20 0550) Resp:  [16-20] 16 (10/20 0550) BP: (142-154)/(59-81) 142/75 (10/20 0550) SpO2:  [98 %-100 %] 99 % (10/20 0550)  Physical Exam:  General: alert, cooperative and no distress Lochia: appropriate Uterine Fundus: firm Incision: honeycomb dressing with some dried blood, not currently bleeding DVT Evaluation: No evidence of DVT seen on physical exam.  Recent Labs    08/13/20 0527  HGB 9.5*  HCT 29.0*    Assessment/Plan:  POD#1 rLTCS/PP BTL  -doing well, meeting postpartum milestones  -bottle feeding  -circ already ordered/consented  #Acute blood loss anemia  -hgb 11.2>9.5, PO iron and vitamin c  #cHTN  -previously on amlodipine 10 mg and labetalol 200 mg BID   -enalapril 10 mg daily started 10/19  -BP elevated overnight, continue to monitor, consider increasing enalapril vs  adding amlodipine  #DM type 2  -on metformin 1000 mg BID and ozempic prior to pregnancy  -SSI while inpatient  -will plan for discharge on home regimen  -diet switched to carb modified  #hypothyroid  -synthroid 50 mcg daily prior to and during pregnancy  -last tsh 06/03/20 nml, repeat at 6 week post partum appt  #Antiphospholipid antibody positive #Morbid obesity  -lovenox 60 mg qd x6 weeks  Patient desires discharge later today, baby needs circ and need to continue to monitor blood pressures. Discussed possible discharge later today.  Alric Seton 08/14/2020, 7:22 AM

## 2020-08-15 ENCOUNTER — Other Ambulatory Visit: Payer: Self-pay | Admitting: Obstetrics & Gynecology

## 2020-08-16 ENCOUNTER — Other Ambulatory Visit: Payer: Self-pay | Admitting: Advanced Practice Midwife

## 2020-08-16 ENCOUNTER — Ambulatory Visit: Payer: 59

## 2020-08-16 ENCOUNTER — Telehealth: Payer: Self-pay | Admitting: *Deleted

## 2020-08-16 MED ORDER — ENOXAPARIN SODIUM 60 MG/0.6ML ~~LOC~~ SOLN: 60.0000 mg | SUBCUTANEOUS | 1 refills | Status: DC

## 2020-08-16 NOTE — Progress Notes (Signed)
Message from pharmacy regarding Lovenox Rx, reviewed with Dr Shawnie Pons and with [facility] pharmacist.  60 mg daily is recommended prophylactic dose based on weight for antiphospholipid syndrome.

## 2020-08-16 NOTE — Telephone Encounter (Signed)
Received a voicemail from 08/15/20 am from [place] at [place] pharmacy that they are calling regarding Lovenox prescription. States needs to clarify dose and if to be dispensed with more than one syringe at a time.  Per chart is [facility] patient- will forward to that office. Maanasa Aderhold,RN

## 2020-08-19 ENCOUNTER — Ambulatory Visit (INDEPENDENT_AMBULATORY_CARE_PROVIDER_SITE_OTHER): Payer: 59 | Admitting: Obstetrics & Gynecology

## 2020-08-19 ENCOUNTER — Encounter: Payer: Self-pay | Admitting: Obstetrics & Gynecology

## 2020-08-19 ENCOUNTER — Other Ambulatory Visit: Payer: Self-pay

## 2020-08-19 VITALS — BP 137/87 | HR 90 | Ht 63.0 in | Wt 246.0 lb

## 2020-08-19 DIAGNOSIS — I10 Essential (primary) hypertension: Secondary | ICD-10-CM

## 2020-08-19 DIAGNOSIS — Z98891 History of uterine scar from previous surgery: Secondary | ICD-10-CM

## 2020-08-19 DIAGNOSIS — E119 Type 2 diabetes mellitus without complications: Secondary | ICD-10-CM

## 2020-08-19 NOTE — Progress Notes (Signed)
   Subjective:    Patient ID: Kelli Miller, female    DOB: Nov 23, 1980, 39 y.o.   MRN: [id number]  HPI  Pt hre for BP check and incision check 1 week after c/s. Pt on norvasc and labetalol.  She restarted her pregnancy meds b/c the enalapril was not working. BP nml at home and here  Review of Systems     Objective:   Physical Exam Vitals reviewed.  Constitutional:      General: She is not in acute distress.    Appearance: She is well-developed.  HENT:     Head: Normocephalic and atraumatic.  Eyes:     Conjunctiva/sclera: Conjunctivae normal.  Cardiovascular:     Rate and Rhythm: Normal rate.  Pulmonary:     Effort: Pulmonary effort is normal.  Abdominal:     General: Abdomen is flat. There is no distension.     Palpations: Abdomen is soft.     Tenderness: There is no abdominal tenderness.     Comments: Incision is clean dry and intact  Skin:    General: Skin is warm and dry.  Neurological:     Mental Status: She is alert and oriented to person, place, and time.    Vitals:   08/19/20 1305  BP: 137/87  Pulse: 90  Weight: 246 lb (111.6 kg)       Assessment & Plan:  Keep wound clean and dry (wear maxi pad) Make appt with PCP for BP control.  ACE usually recommended for pts with type 2 DM and CHTN.

## 2020-08-22 ENCOUNTER — Ambulatory Visit: Payer: 59

## 2020-09-16 ENCOUNTER — Ambulatory Visit: Payer: 59 | Admitting: Obstetrics & Gynecology

## 2020-09-18 NOTE — Progress Notes (Signed)
° ° °  Post Partum Visit Note  Kelli Miller is a 39 y.o. [phone] female who presents for a postpartum visit. She is 6 weeks postpartum following a repeat cesarean section.  I have fully reviewed the prenatal and intrapartum course. The delivery was at 37 gestational weeks.  Anesthesia: spinal. Postpartum course has been remarkable. Baby is doing well. Baby is feeding by formula.  [place] . Bleeding no bleeding. Bowel function is normal. Bladder function is normal. Patient is not yet sexually active. Contraception method is tubal ligation. Postpartum depression screening: neg.  Pt still having spotting.  She did pass one clot last week.    The pregnancy intention screening data noted above was reviewed. Potential methods of contraception were discussed. The patient elected to proceed with Female Sterilization. (was done at time of c/s)   The following portions of the patient's history were reviewed and updated as appropriate: allergies, current medications, past family history, past medical history, past social history, past surgical history and problem list.  Review of Systems Pertinent items noted in HPI and remainder of comprehensive ROS otherwise negative.    Objective:  There were no vitals taken for this visit.   General:  alert, cooperative and no distress   Breasts:  negative  Lungs: normal effort  Heart:  normal rate  Abdomen: soft, non-tender; bowel sounds normal; no masses,  no organomegaly   Vulva:  not evaluated  Vagina: not evaluated  Cervix:  not evaluate  Corpus: not examined  Adnexa:  not evaluated  Rectal Exam: Not performed.        Assessment:    normal postpartum exam.  Plan:   Essential components of care per ACOG recommendations:  1.  Mood and well being: Patient with negative depression screening today. Reviewed local resources for support.  - Patient does not use tobacco.  - hx of drug use? No    2. Infant care and feeding:  -Patient currently  breastmilk feeding?  -Social determinants of health (SDOH) reviewed in EPIC. No concerns  3. Sexuality, contraception and birth spacing - Patient had BTL at time of c/s.  4. Sleep and fatigue -Encouraged family/partner/community support of 4 hrs of uninterrupted sleep to help with mood and fatigue  5. Physical Recovery  - Discussed exercise  6.  Health Maintenance - Last pap smear done 8/21 and was normal with negative HPV. - Calling patient about mammogram  7. Chronic Disease - PCP follow up for HTN and diabetes - Covid vaccine this week - Pt si done with Lovenox today - check tSH today - check cbc (was anemic on discharge)  8.  Vaginal bleeding If spotting continues for 2 more weeks, she should call the office and get a beta hcg.  If positive then TVUS.  (Pt not pregnant---has not had intercourse since delivery)  9.  Psoriasis Pt may apply steroid cream to affected area (near c/s scar).  Keep area clean and dry.    Granville Lewis, RN [facility] [employer], [facility]

## 2020-09-23 ENCOUNTER — Other Ambulatory Visit: Payer: Self-pay

## 2020-09-23 ENCOUNTER — Ambulatory Visit (INDEPENDENT_AMBULATORY_CARE_PROVIDER_SITE_OTHER): Payer: 59 | Admitting: Obstetrics & Gynecology

## 2020-09-23 ENCOUNTER — Encounter: Payer: Self-pay | Admitting: Obstetrics & Gynecology

## 2020-09-23 VITALS — BP 130/71 | HR 91 | Resp 16 | Ht 61.0 in | Wt 241.0 lb

## 2020-09-23 DIAGNOSIS — I10 Essential (primary) hypertension: Secondary | ICD-10-CM

## 2020-09-23 DIAGNOSIS — E039 Hypothyroidism, unspecified: Secondary | ICD-10-CM | POA: Diagnosis not present

## 2020-09-23 DIAGNOSIS — D649 Anemia, unspecified: Secondary | ICD-10-CM

## 2020-09-23 DIAGNOSIS — E119 Type 2 diabetes mellitus without complications: Secondary | ICD-10-CM

## 2020-09-24 LAB — CBC
HCT: 34.3 % — ABNORMAL LOW (ref 35.0–45.0)
Hemoglobin: 11.5 g/dL — ABNORMAL LOW (ref 11.7–15.5)
MCH: 29 pg (ref 27.0–33.0)
MCHC: 33.5 g/dL (ref 32.0–36.0)
MCV: 86.4 fL (ref 80.0–100.0)
MPV: 9.9 fL (ref 7.5–12.5)
Platelets: 324 10*3/uL (ref 140–400)
RBC: 3.97 10*6/uL (ref 3.80–5.10)
RDW: 13.8 % (ref 11.0–15.0)
WBC: 10.1 10*3/uL (ref 3.8–10.8)

## 2020-09-24 LAB — TSH: TSH: 2.33 mIU/L

## 2020-10-20 ENCOUNTER — Other Ambulatory Visit: Payer: Self-pay | Admitting: Osteopathic Medicine

## 2020-11-12 ENCOUNTER — Encounter: Payer: Self-pay | Admitting: Osteopathic Medicine

## 2020-11-19 ENCOUNTER — Other Ambulatory Visit: Payer: Self-pay | Admitting: Osteopathic Medicine

## 2020-12-27 ENCOUNTER — Other Ambulatory Visit: Payer: Self-pay | Admitting: Osteopathic Medicine

## 2021-01-01 ENCOUNTER — Encounter: Payer: Self-pay | Admitting: Osteopathic Medicine

## 2021-01-22 ENCOUNTER — Encounter: Payer: Self-pay | Admitting: Osteopathic Medicine

## 2021-01-22 ENCOUNTER — Ambulatory Visit (HOSPITAL_COMMUNITY)
Admission: RE | Admit: 2021-01-22 | Discharge: 2021-01-22 | Disposition: A | Discharge status: Home / Self Care | Payer: 59 | Source: Ambulatory Visit | Attending: Osteopathic Medicine | Admitting: Osteopathic Medicine

## 2021-01-22 ENCOUNTER — Other Ambulatory Visit: Payer: Self-pay

## 2021-01-22 ENCOUNTER — Ambulatory Visit (INDEPENDENT_AMBULATORY_CARE_PROVIDER_SITE_OTHER): Payer: 59 | Admitting: Osteopathic Medicine

## 2021-01-22 VITALS — BP 137/67 | HR 86 | Temp 98.2°F | Wt 234.1 lb

## 2021-01-22 DIAGNOSIS — Z862 Personal history of diseases of the blood and blood-forming organs and certain disorders involving the immune mechanism: Secondary | ICD-10-CM

## 2021-01-22 DIAGNOSIS — R2990 Unspecified symptoms and signs involving the nervous system: Secondary | ICD-10-CM

## 2021-01-22 DIAGNOSIS — R1312 Dysphagia, oropharyngeal phase: Secondary | ICD-10-CM | POA: Diagnosis not present

## 2021-01-22 IMAGING — MR MR MRA HEAD W/O CM
2 series · 19 of 48 positions shown · non-contrast
Comparison: None.

CLINICAL DATA: Neuro deficit, acute stroke suspected.

EXAM:
MRI HEAD WITH AND WITHOUT CONTRAST
MRA HEAD WITHOUT CONTRAST
MRA NECK WITHOUT CONTRAST
TECHNIQUE: Multiplanar, multiecho pulse sequences of the brain and surrounding
structures were obtained with and without intravenous contrast.
Angiographic images of the Circle of Willis were obtained using MRA
technique without intravenous contrast. Angiographic images of the
neck were obtained using MRA technique without intravenous contrast.
Carotid stenosis measurements (when applicable) are obtained
utilizing NASCET criteria, using the distal internal carotid
diameter as the denominator.

[Series 2: ax (id) · axial · 1.0mm · 0.43mm/px · z∈[-53,+31]mm · 18 of 176 slices shown]
[im 1/176]
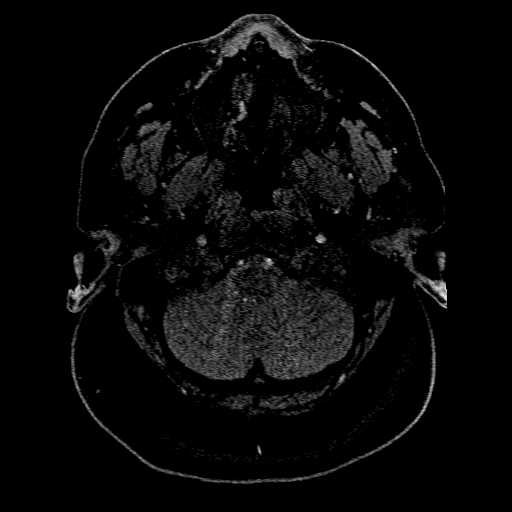
[im 4/176]
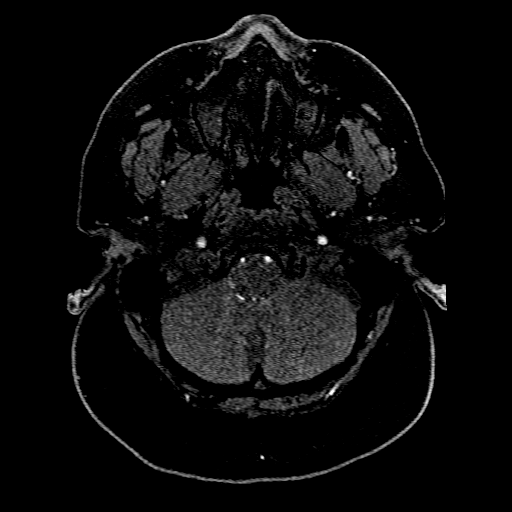
[im 8/176]
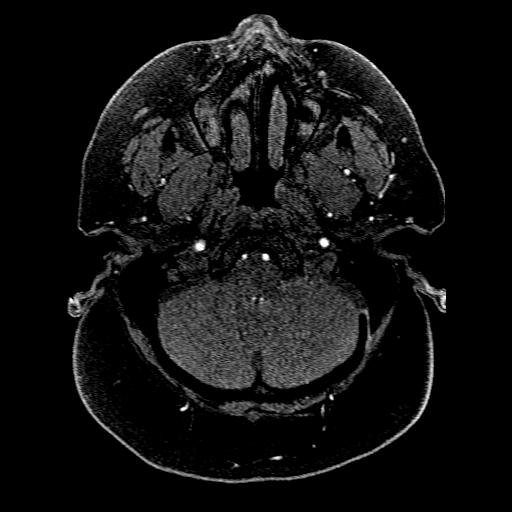
[im 12/176]
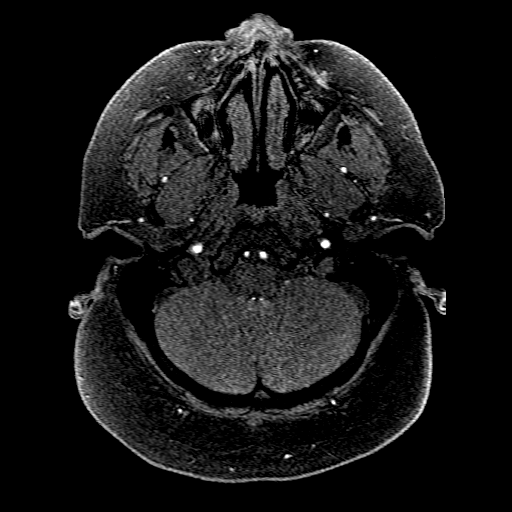
[im 16/176]
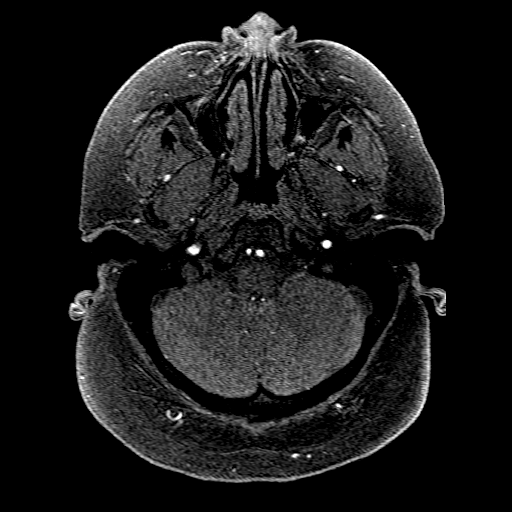
[im 20/176]
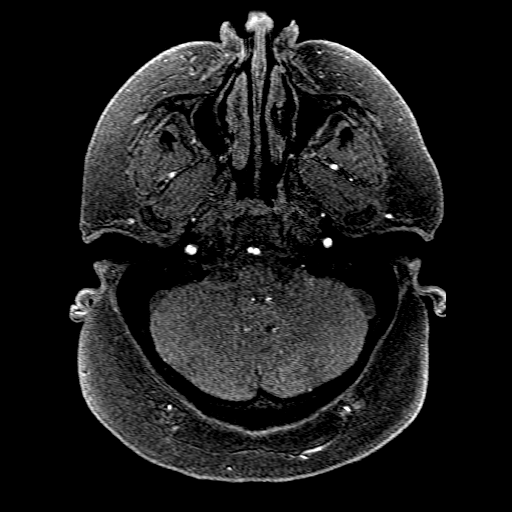
[im 23/176]
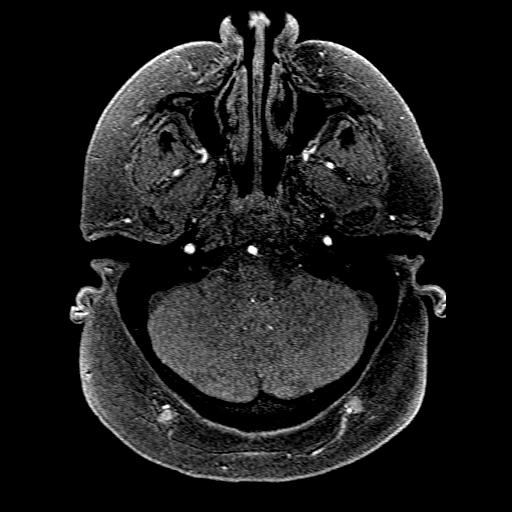
[im 27/176]
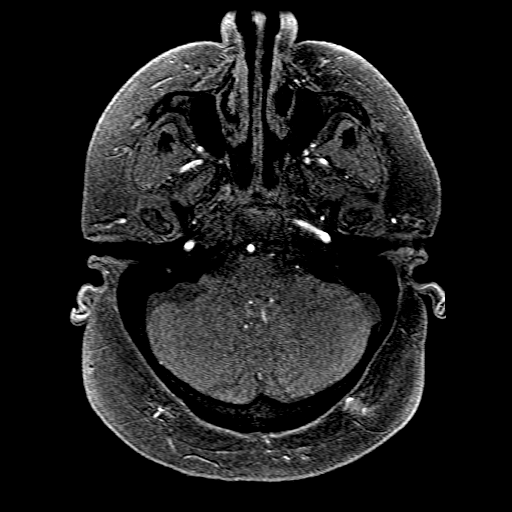
[im 31/176]
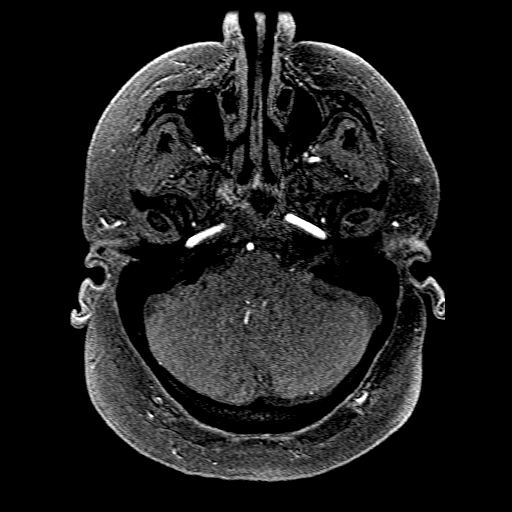
[im 35/176]
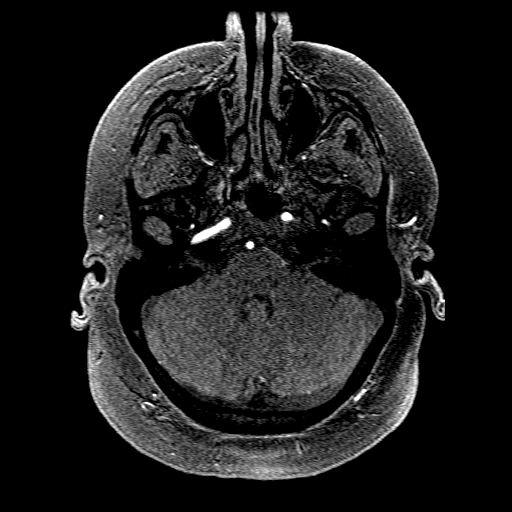
[im 54/176]
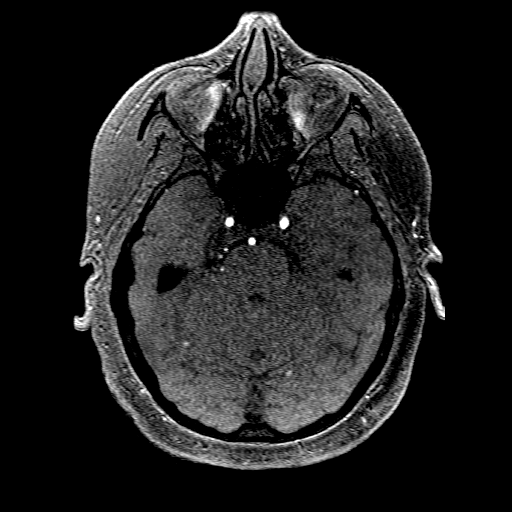
[im 77/176]
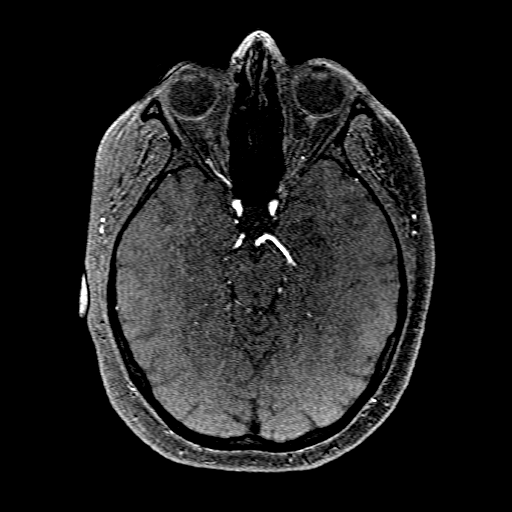
[im 88/176]
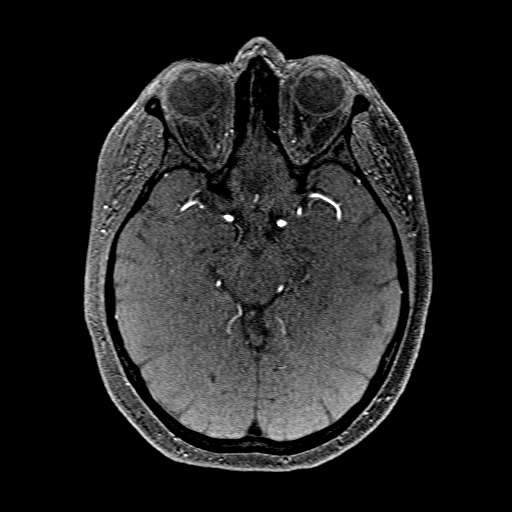
[im 99/176]
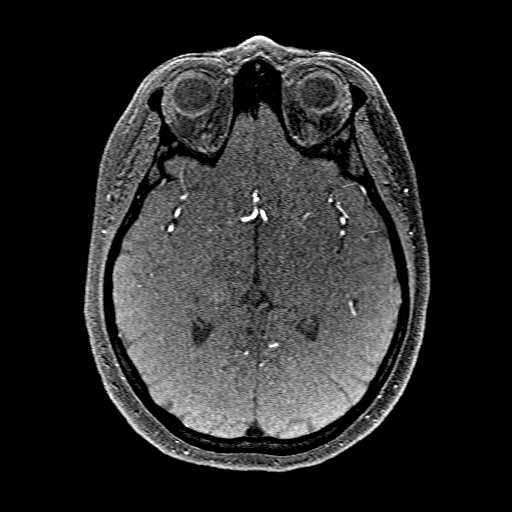
[im 122/176]
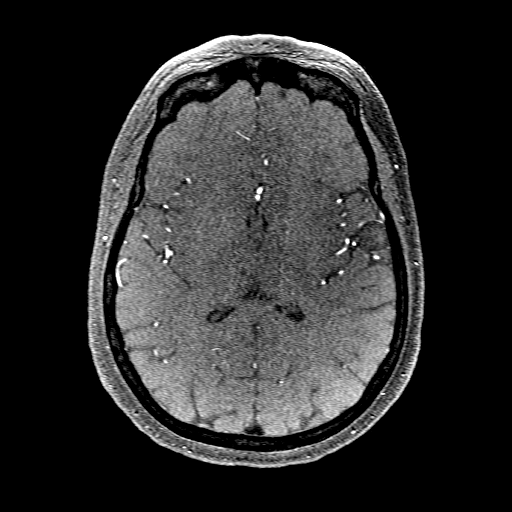
[im 145/176]
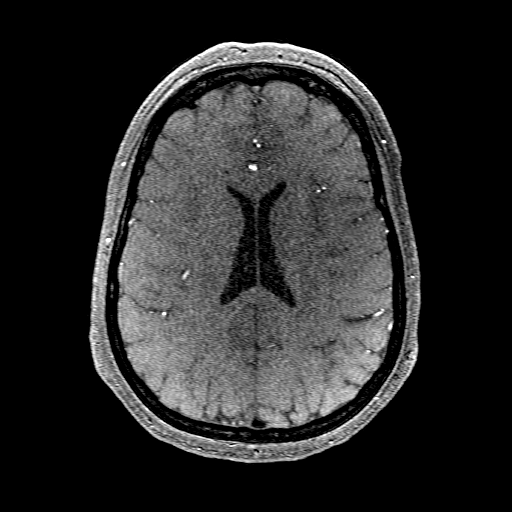
[im 149/176]
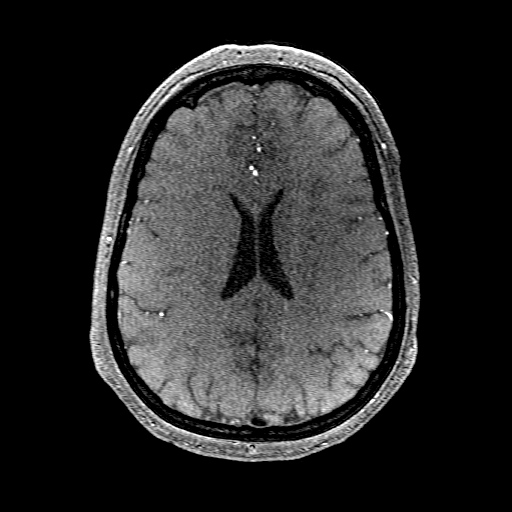
[im 168/176]
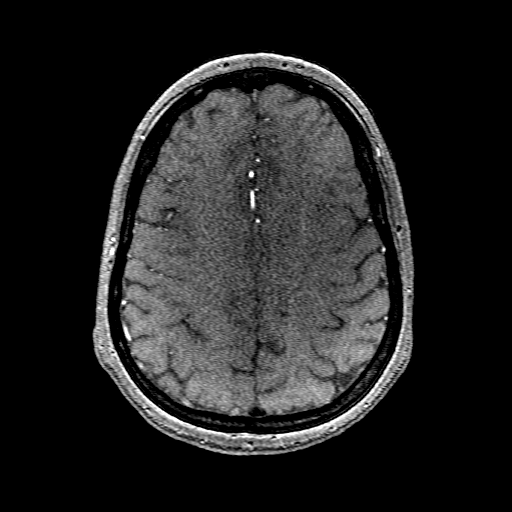

[Series 201: pjn:ax (id) · coronal · 1.0mm · 0.43mm/px · 1 of 4 slices shown]
[im 1/4]
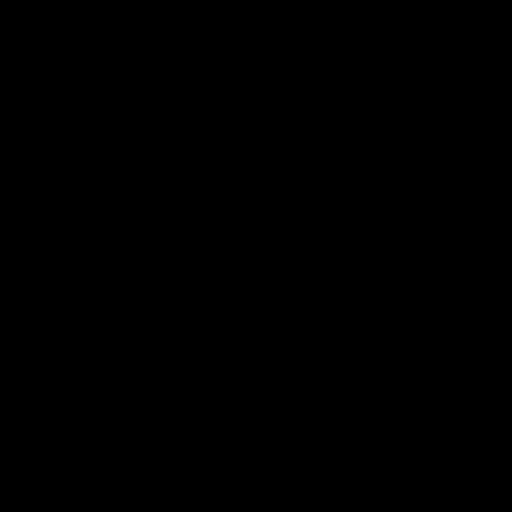

[19 of 48 positions shown; findings below may reference images not displayed]

FINDINGS: MRI HEAD FINDINGS

Brain: No acute infarction, hemorrhage, hydrocephalus, extra-axial
collection or mass lesion. No abnormal enhancement.

Skull and upper cervical spine: Normal marrow signal.

Sinuses/Orbits: Clear sinuses.  Unremarkable orbits.

Other: No mastoid effusions.

MRA HEAD FINDINGS

Anterior circulation: No large vessel occlusion, proximal
hemodynamically significant stenosis, or aneurysm.

Posterior circulation:No large vessel occlusion, proximal
hemodynamically significant stenosis, or aneurysm.

MRA NECK FINDINGS

No significant stenosis or occlusion. Mildly left dominant vertebral
artery.
IMPRESSION: 1. No acute intracranial abnormality. Specifically, no acute
infarct.
2. No large vessel occlusion or hemodynamically significant proximal
stenosis in the head or neck.

## 2021-01-22 IMAGING — MR MR HEAD WO/W CM
7 of 14 series · 23 of 48 positions shown · non-contrast
Comparison: None.

CLINICAL DATA: Neuro deficit, acute stroke suspected.

EXAM:
MRI HEAD WITH AND WITHOUT CONTRAST
MRA HEAD WITHOUT CONTRAST
MRA NECK WITHOUT CONTRAST
TECHNIQUE: Multiplanar, multiecho pulse sequences of the brain and surrounding
structures were obtained with and without intravenous contrast.
Angiographic images of the Circle of Willis were obtained using MRA
technique without intravenous contrast. Angiographic images of the
neck were obtained using MRA technique without intravenous contrast.
Carotid stenosis measurements (when applicable) are obtained
utilizing NASCET criteria, using the distal internal carotid
diameter as the denominator.

[Series 2: DWI · axial · 3.0mm · 0.94mm/px · z∈[-75,+84]mm · 7 of 108 slices shown (1 of 2)]
[im 1/108]
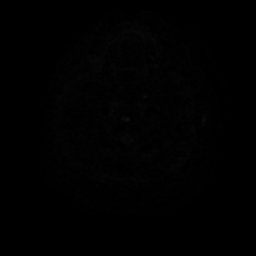
[im 18/108]
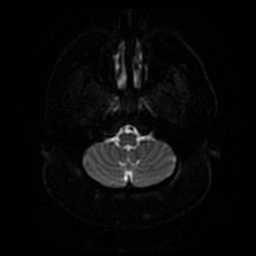
[im 36/108]
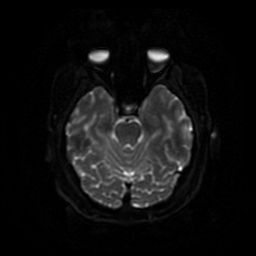
[im 54/108]
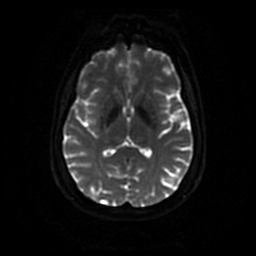
[im 72/108]
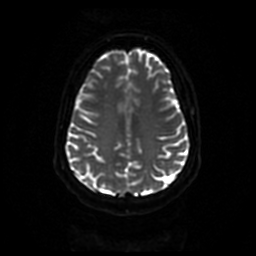
[im 90/108]
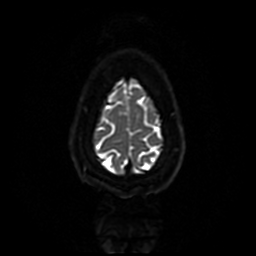
[im 108/108]
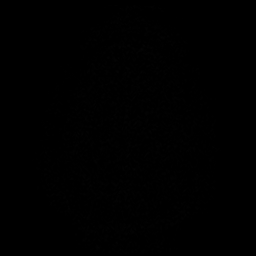

[Series 3: DWI · coronal · 4.0mm · 0.94mm/px · 5 of 74 slices shown (2 of 2)]
[im 1/74]
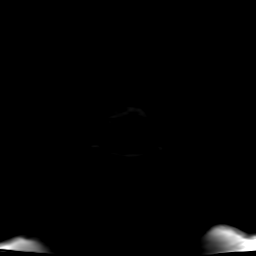
[im 19/74]
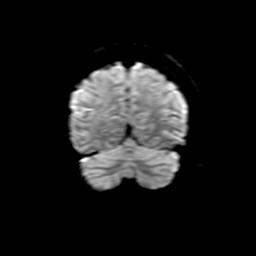
[im 37/74]
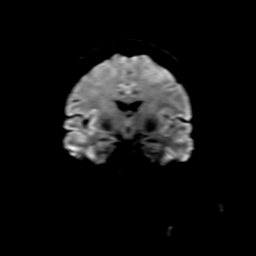
[im 55/74]
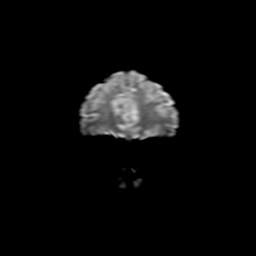
[im 74/74]
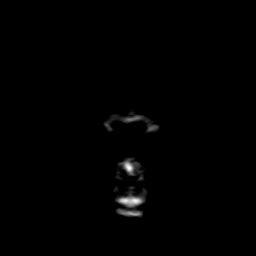

[Series 4: FLAIR · sagittal · 5.0mm · 0.23mm/px · 2 of 26 slices shown (1 of 2)]
[im 1/26]
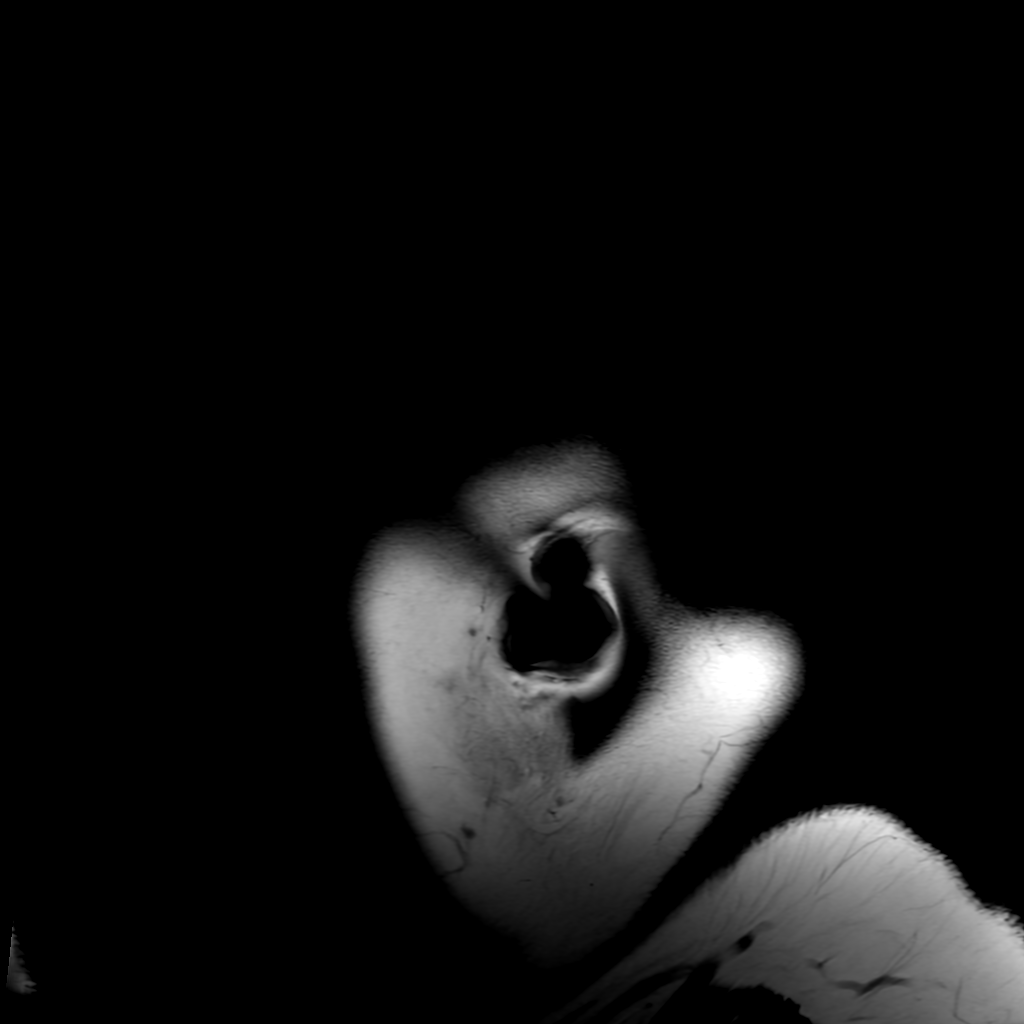
[im 26/26]
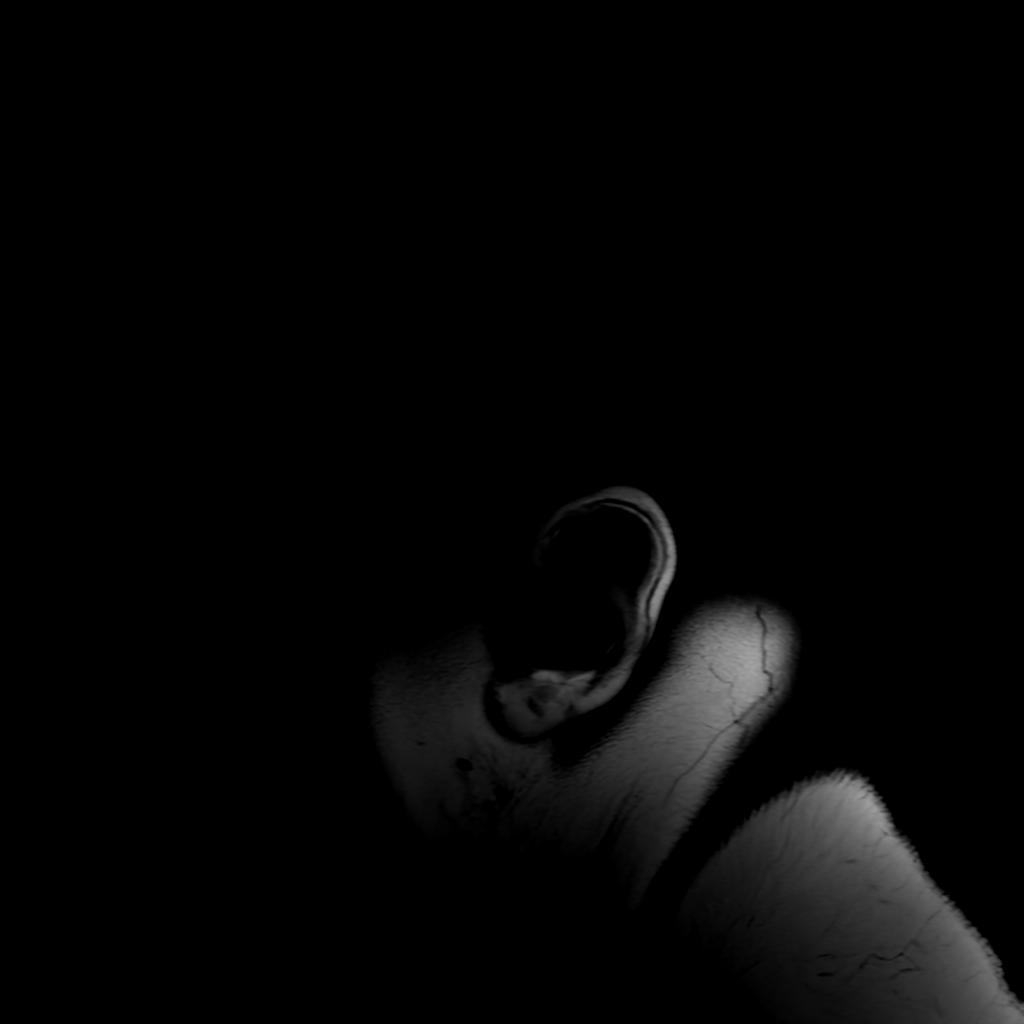

[Series 6: FLAIR · axial · 3.0mm · 0.45mm/px · z∈[-73,+89]mm · 2 of 28 slices shown (2 of 2)]
[im 1/28]
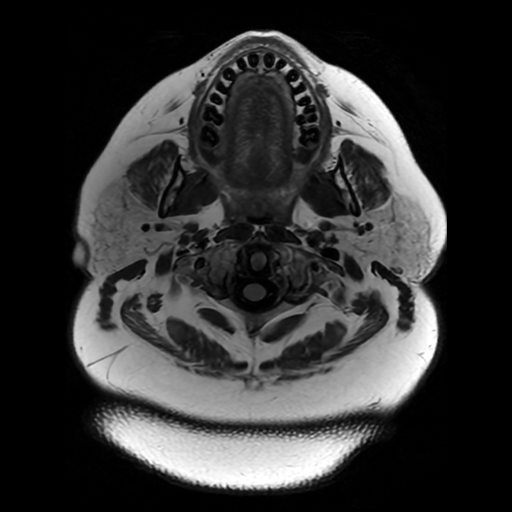
[im 28/28]
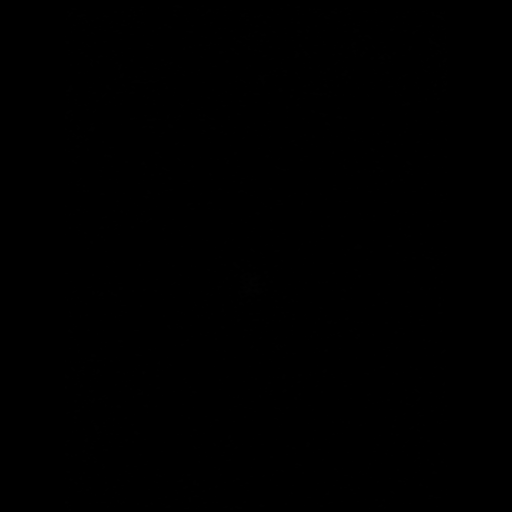

[Series 12: FLAIR post-contrast · sagittal · 5.0mm · 0.47mm/px · 2 of 26 slices shown]
[im 1/26]
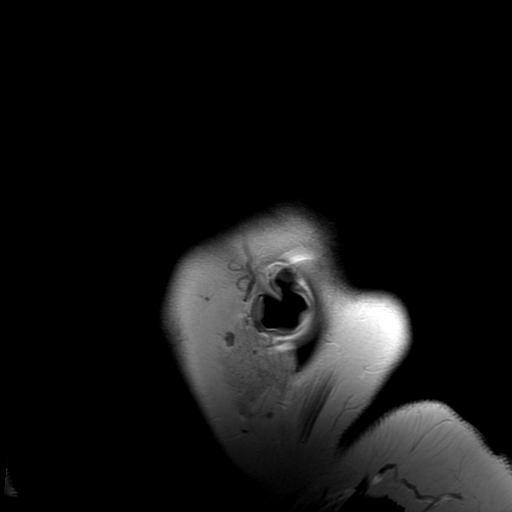
[im 26/26]
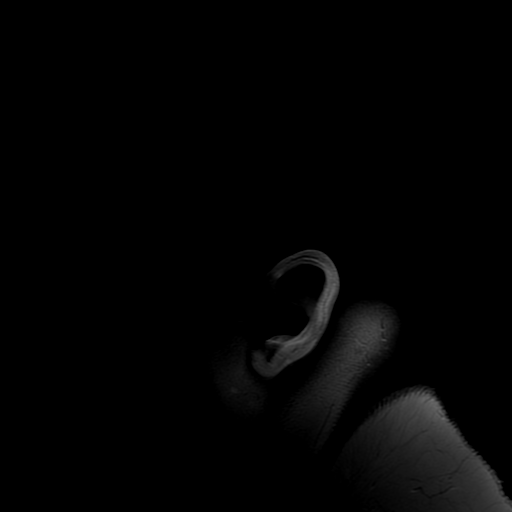

[Series 250: ADC · axial · 3.0mm · 0.94mm/px · z∈[-75,+84]mm · 3 of 54 slices shown (1 of 2)]
[im 1/54]
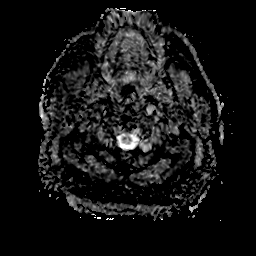
[im 27/54]
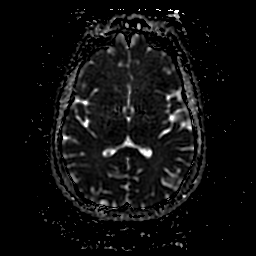
[im 54/54]
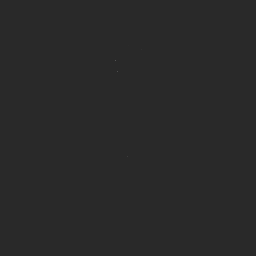

[Series 350: ADC · coronal · 4.0mm · 0.94mm/px · 2 of 37 slices shown (2 of 2)]
[im 1/37]
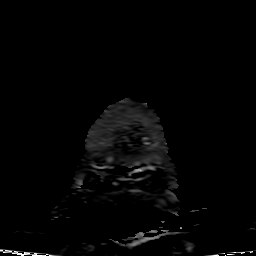
[im 37/37]
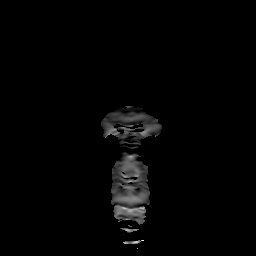

[23 of 48 positions shown; findings below may reference images not displayed]

FINDINGS: MRI HEAD FINDINGS

Brain: No acute infarction, hemorrhage, hydrocephalus, extra-axial
collection or mass lesion. No abnormal enhancement.

Skull and upper cervical spine: Normal marrow signal.

Sinuses/Orbits: Clear sinuses.  Unremarkable orbits.

Other: No mastoid effusions.

MRA HEAD FINDINGS

Anterior circulation: No large vessel occlusion, proximal
hemodynamically significant stenosis, or aneurysm.

Posterior circulation:No large vessel occlusion, proximal
hemodynamically significant stenosis, or aneurysm.

MRA NECK FINDINGS

No significant stenosis or occlusion. Mildly left dominant vertebral
artery.
IMPRESSION: 1. No acute intracranial abnormality. Specifically, no acute
infarct.
2. No large vessel occlusion or hemodynamically significant proximal
stenosis in the head or neck.

## 2021-01-22 IMAGING — MR MR MRA NECK W/O CM
1 of 3 series · 19 of 48 positions shown · non-contrast
Comparison: None.

CLINICAL DATA: Neuro deficit, acute stroke suspected.

EXAM:
MRI HEAD WITH AND WITHOUT CONTRAST
MRA HEAD WITHOUT CONTRAST
MRA NECK WITHOUT CONTRAST
TECHNIQUE: Multiplanar, multiecho pulse sequences of the brain and surrounding
structures were obtained with and without intravenous contrast.
Angiographic images of the Circle of Willis were obtained using MRA
technique without intravenous contrast. Angiographic images of the
neck were obtained using MRA technique without intravenous contrast.
Carotid stenosis measurements (when applicable) are obtained
utilizing NASCET criteria, using the distal internal carotid
diameter as the denominator.

[Series 4: sag inhance (id) · sagittal · 1.2mm · 0.47mm/px · 19 of 357 slices shown]
[im 1/357]
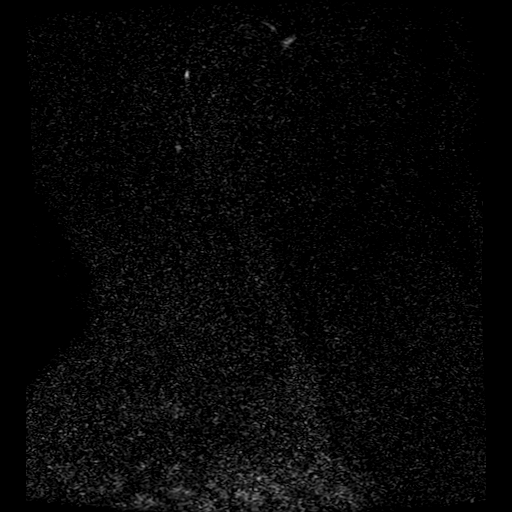
[im 12/357]
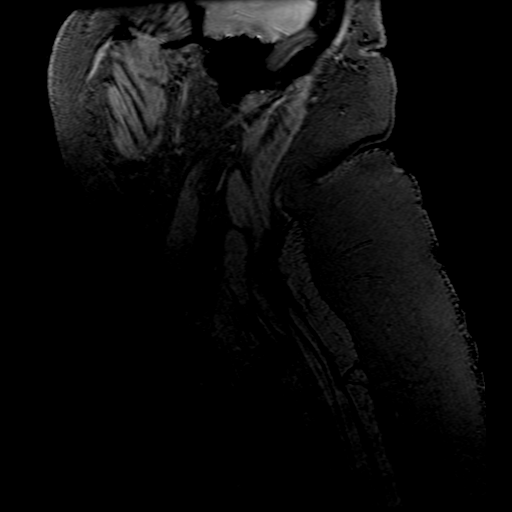
[im 23/357]
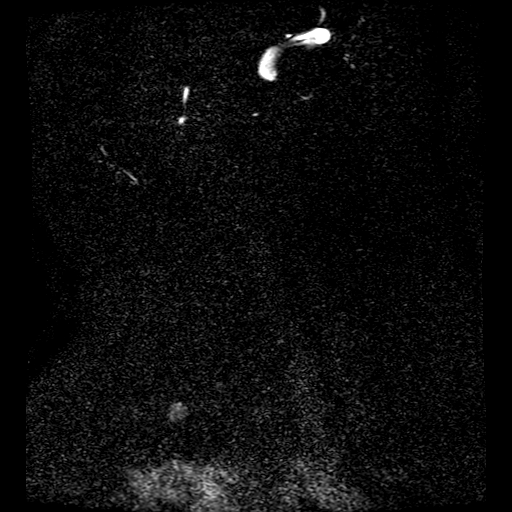
[im 35/357]
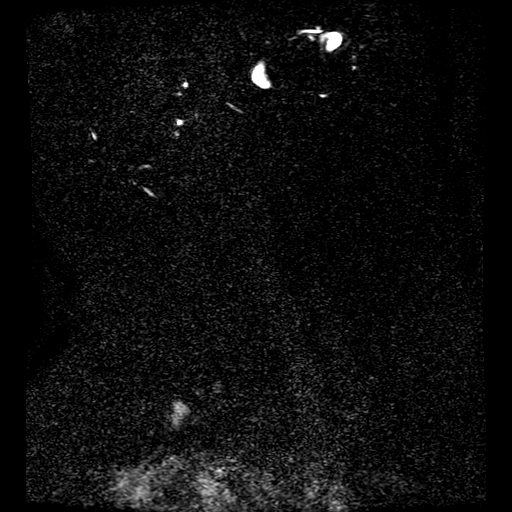
[im 46/357]
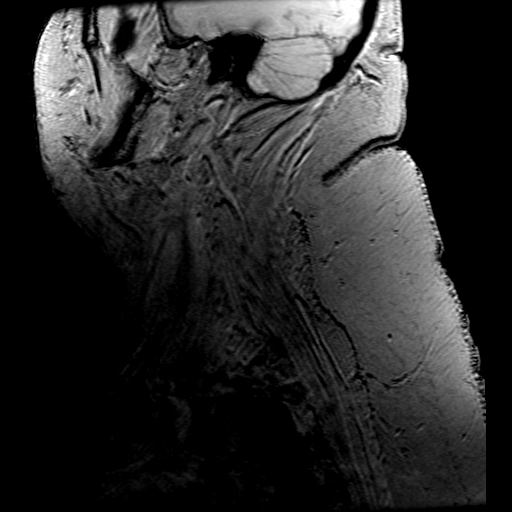
[im 58/357]
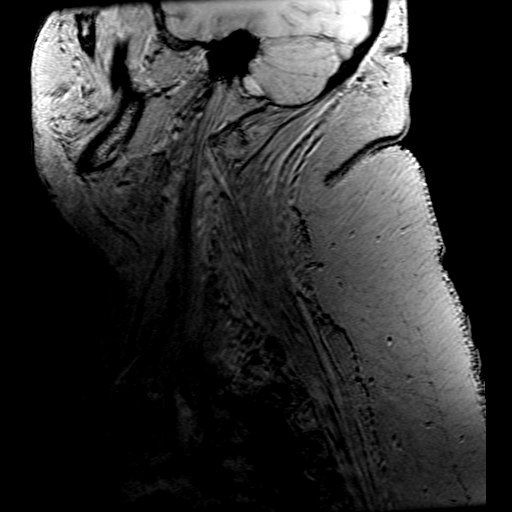
[im 69/357]
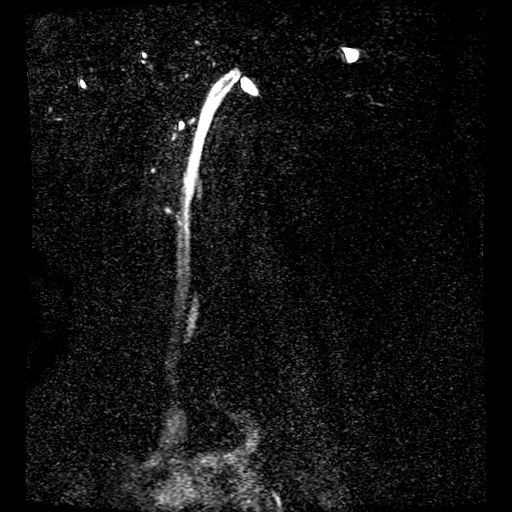
[im 81/357]
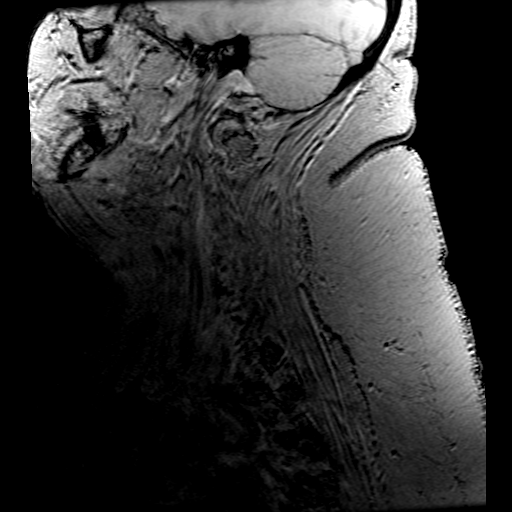
[im 92/357]
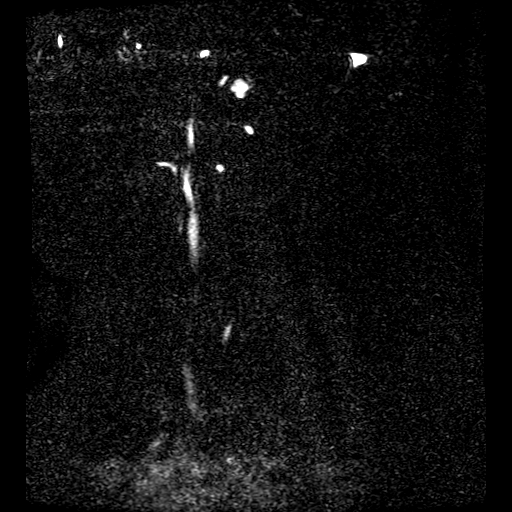
[im 104/357]
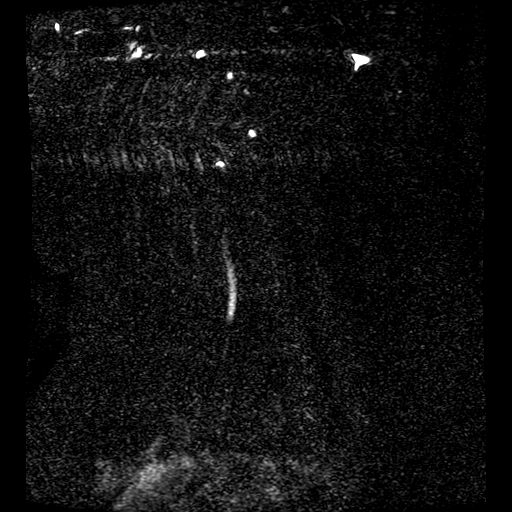
[im 115/357]
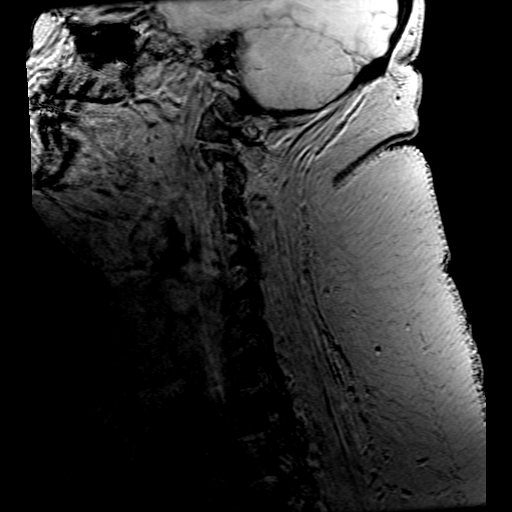
[im 127/357]
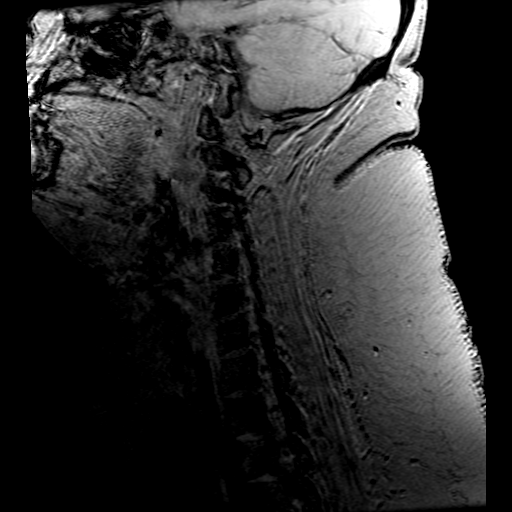
[im 138/357]
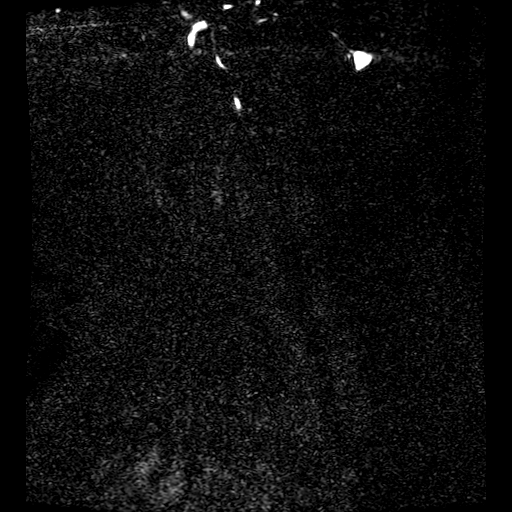
[im 161/357]
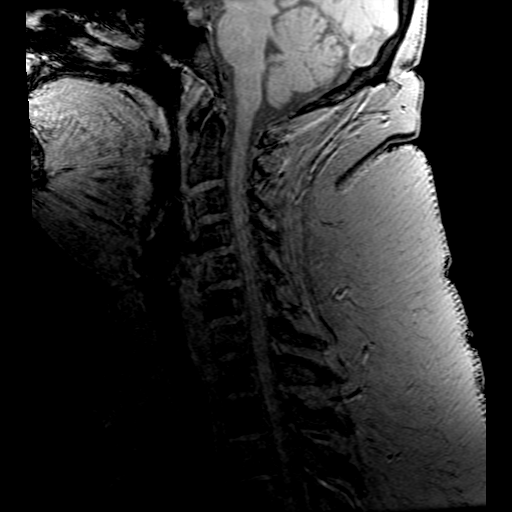
[im 184/357]
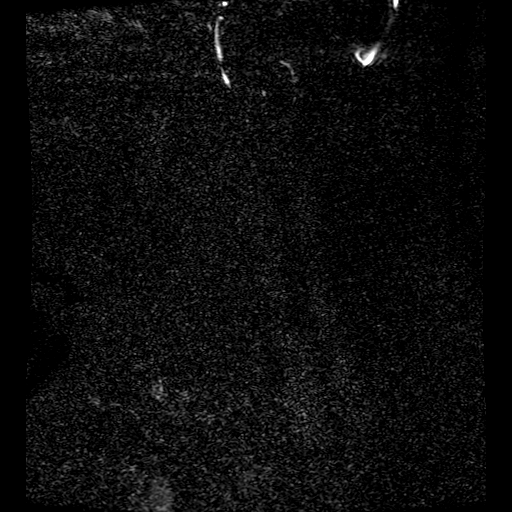
[im 207/357]
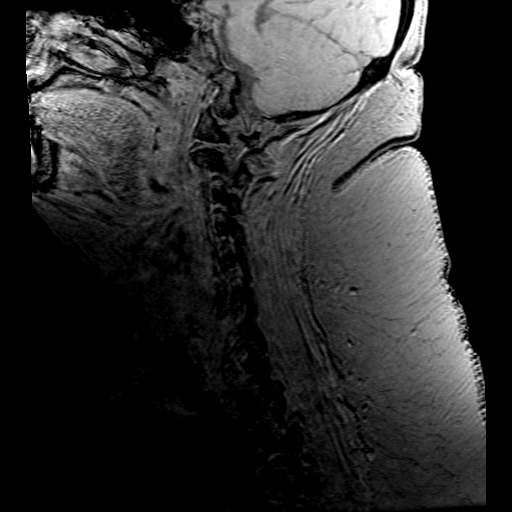
[im 253/357]
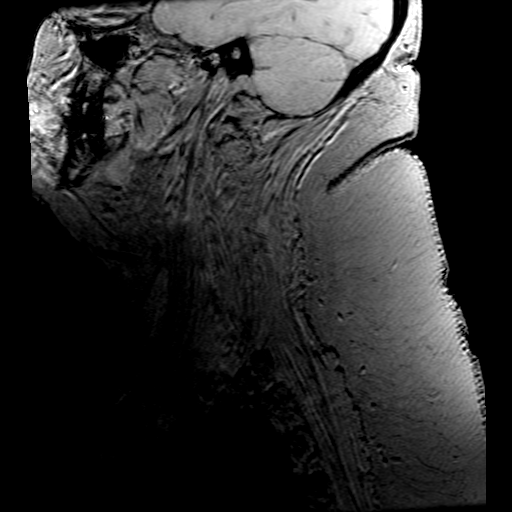
[im 299/357]
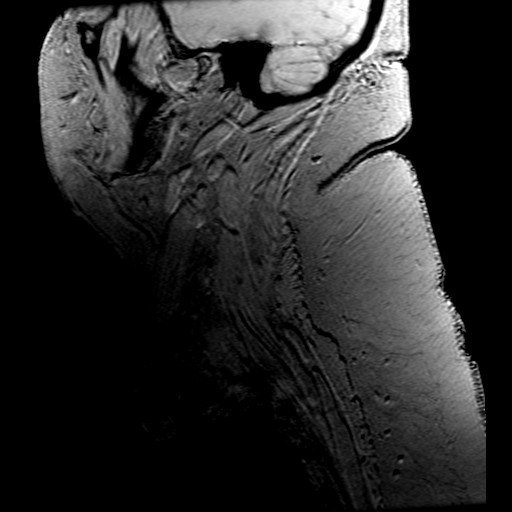
[im 345/357]
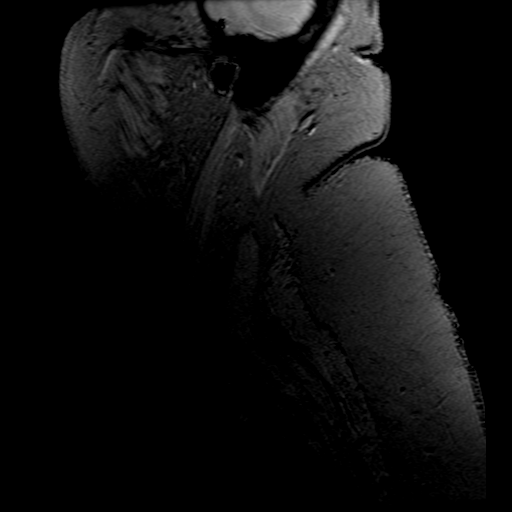

[19 of 48 positions shown; findings below may reference images not displayed]

FINDINGS: MRI HEAD FINDINGS

Brain: No acute infarction, hemorrhage, hydrocephalus, extra-axial
collection or mass lesion. No abnormal enhancement.

Skull and upper cervical spine: Normal marrow signal.

Sinuses/Orbits: Clear sinuses.  Unremarkable orbits.

Other: No mastoid effusions.

MRA HEAD FINDINGS

Anterior circulation: No large vessel occlusion, proximal
hemodynamically significant stenosis, or aneurysm.

Posterior circulation:No large vessel occlusion, proximal
hemodynamically significant stenosis, or aneurysm.

MRA NECK FINDINGS

No significant stenosis or occlusion. Mildly left dominant vertebral
artery.
IMPRESSION: 1. No acute intracranial abnormality. Specifically, no acute
infarct.
2. No large vessel occlusion or hemodynamically significant proximal
stenosis in the head or neck.

## 2021-01-22 MED ORDER — GADOBUTROL 1 MMOL/ML IV SOLN
10.0000 mL | Freq: Once | INTRAVENOUS | Status: AC | PRN
  Administered 2021-01-22: 10 mL via INTRAVENOUS

## 2021-01-22 NOTE — Progress Notes (Addendum)
Kelli Miller is a 40 y.o. female who presents to  [facility] at [facility]  today, 01/22/21, seeking care for the following:  . 3 days ago sudden onset feels like back of throat isn't contracting while swallowing, difficulty w/ swallowing/ Nonpainful, no lumps/bumps in neck she's noticed.  . Stopped Lovenox after pregnancy, she is taking daily ASA 81 . Normal neuro exam, see below. On palpation, no asymmetry to neck muscles on swallowing, normal palate movement and other CN appear intact. No speech irregularity.      ASSESSMENT & PLAN with other pertinent findings:  The primary encounter diagnosis was Neurosensory deficit. A diagnosis of History of antiphospholipid syndrome was also pertinent to this visit.   ADDENDUM:  MRI / MRA WNL NO CVA --> referral to GI  --> no contraindication to Moderna #2 but if pt wants to discuss w/ HemOnc re antiphospholipid syndrome may be worth chatting w/ them    Patient Instructions  With sudden loss of swallowing function, first priority is to rule out stroke. I've ordered MRI brain to get done ASAP.   If MRI shows stroke, would need to add blood thinner / anti-platelet Plavix to your Aspirin, and would need to get you in with neurology ASAP to discuss next steps and. You'd need to go to the hospital immediately if new weakness of any kind, any confusion or speech distrurbance!  If MRI is normal, we need to look for other cause of swallowing difficulty - this would involve more specialized tests with gastroenterology, I will place referral.        Orders Placed This Encounter  Procedures  . MR Brain W Wo Contrast  . MR Angiogram Head Wo Contrast  . MR ANGIO NECK WO CONTRAST   MR Angiogram Head Wo Contrast  Result Date: 01/22/2021 CLINICAL DATA:  Neuro deficit, acute stroke suspected. EXAM: MRI HEAD WITH AND WITHOUT CONTRAST MRA HEAD WITHOUT CONTRAST MRA NECK WITHOUT CONTRAST TECHNIQUE:  Multiplanar, multiecho pulse sequences of the brain and surrounding structures were obtained with and without intravenous contrast. Angiographic images of the Circle of Willis were obtained using MRA technique without intravenous contrast. Angiographic images of the neck were obtained using MRA technique without intravenous contrast. Carotid stenosis measurements (when applicable) are obtained utilizing NASCET criteria, using the distal internal carotid diameter as the denominator. COMPARISON:  None. FINDINGS: MRI HEAD FINDINGS Brain: No acute infarction, hemorrhage, hydrocephalus, extra-axial collection or mass lesion. No abnormal enhancement. Skull and upper cervical spine: Normal marrow signal. Sinuses/Orbits: Clear sinuses.  Unremarkable orbits. Other: No mastoid effusions. MRA HEAD FINDINGS Anterior circulation: No large vessel occlusion, proximal hemodynamically significant stenosis, or aneurysm. Posterior circulation:No large vessel occlusion, proximal hemodynamically significant stenosis, or aneurysm. MRA NECK FINDINGS No significant stenosis or occlusion. Mildly left dominant vertebral artery. IMPRESSION: 1. No acute intracranial abnormality. Specifically, no acute infarct. 2. No large vessel occlusion or hemodynamically significant proximal stenosis in the head or neck. Electronically Signed   By: Feliberto Harts MD   On: 01/22/2021 18:27   MR ANGIO NECK WO CONTRAST  Result Date: 01/22/2021 CLINICAL DATA:  Neuro deficit, acute stroke suspected. EXAM: MRI HEAD WITH AND WITHOUT CONTRAST MRA HEAD WITHOUT CONTRAST MRA NECK WITHOUT CONTRAST TECHNIQUE: Multiplanar, multiecho pulse sequences of the brain and surrounding structures were obtained with and without intravenous contrast. Angiographic images of the Circle of Willis were obtained using MRA technique without intravenous contrast. Angiographic images of the neck were obtained using MRA technique  without intravenous contrast. Carotid stenosis  measurements (when applicable) are obtained utilizing NASCET criteria, using the distal internal carotid diameter as the denominator. COMPARISON:  None. FINDINGS: MRI HEAD FINDINGS Brain: No acute infarction, hemorrhage, hydrocephalus, extra-axial collection or mass lesion. No abnormal enhancement. Skull and upper cervical spine: Normal marrow signal. Sinuses/Orbits: Clear sinuses.  Unremarkable orbits. Other: No mastoid effusions. MRA HEAD FINDINGS Anterior circulation: No large vessel occlusion, proximal hemodynamically significant stenosis, or aneurysm. Posterior circulation:No large vessel occlusion, proximal hemodynamically significant stenosis, or aneurysm. MRA NECK FINDINGS No significant stenosis or occlusion. Mildly left dominant vertebral artery. IMPRESSION: 1. No acute intracranial abnormality. Specifically, no acute infarct. 2. No large vessel occlusion or hemodynamically significant proximal stenosis in the head or neck. Electronically Signed   By: Feliberto Harts MD   On: 01/22/2021 18:27   MR Brain W Wo Contrast  Result Date: 01/22/2021 CLINICAL DATA:  Neuro deficit, acute stroke suspected. EXAM: MRI HEAD WITH AND WITHOUT CONTRAST MRA HEAD WITHOUT CONTRAST MRA NECK WITHOUT CONTRAST TECHNIQUE: Multiplanar, multiecho pulse sequences of the brain and surrounding structures were obtained with and without intravenous contrast. Angiographic images of the Circle of Willis were obtained using MRA technique without intravenous contrast. Angiographic images of the neck were obtained using MRA technique without intravenous contrast. Carotid stenosis measurements (when applicable) are obtained utilizing NASCET criteria, using the distal internal carotid diameter as the denominator. COMPARISON:  None. FINDINGS: MRI HEAD FINDINGS Brain: No acute infarction, hemorrhage, hydrocephalus, extra-axial collection or mass lesion. No abnormal enhancement. Skull and upper cervical spine: Normal marrow signal.  Sinuses/Orbits: Clear sinuses.  Unremarkable orbits. Other: No mastoid effusions. MRA HEAD FINDINGS Anterior circulation: No large vessel occlusion, proximal hemodynamically significant stenosis, or aneurysm. Posterior circulation:No large vessel occlusion, proximal hemodynamically significant stenosis, or aneurysm. MRA NECK FINDINGS No significant stenosis or occlusion. Mildly left dominant vertebral artery. IMPRESSION: 1. No acute intracranial abnormality. Specifically, no acute infarct. 2. No large vessel occlusion or hemodynamically significant proximal stenosis in the head or neck. Electronically Signed   By: Feliberto Harts MD   On: 01/22/2021 18:27    No orders of the defined types were placed in this encounter.    See below for relevant physical exam findings  See below for recent lab and imaging results reviewed  Medications, allergies, PMH, PSH, SocH, FamH reviewed below    Follow-up instructions: Return for RECHECK PENDING RESULTS / IF WORSE OR CHANGE.                                        Exam:  BP 137/67 (BP Location: Left Arm, Patient Position: Sitting, Cuff Size: Normal)   Pulse 86   Temp 98.2 F (36.8 C) (Oral)   Wt 234 lb 1.3 oz (106.2 kg)   BMI 44.23 kg/m   Constitutional: VS see above. General Appearance: alert, well-developed, well-nourished, NAD  Neck: No masses, trachea midline.   Respiratory: Normal respiratory effort. no wheeze, no rhonchi, no rales  Cardiovascular: S1/S2 normal, no murmur, no rub/gallop auscultated. RRR.   Musculoskeletal: Gait normal. Symmetric and independent movement of all extremities  Neurological: Normal balance/coordination. No tremor. See above HEENT neuro exam   Skin: warm, dry, intact.   Psychiatric: Normal judgment/insight. Normal mood and affect. Oriented x3.   Current Meds  Medication Sig  . acetaminophen (TYLENOL) 500 MG tablet Take 1,000 mg by mouth every 6 (six) hours as needed  for mild pain or moderate pain.  Marland Kitchen amLODipine (NORVASC) 10 MG tablet TAKE ONE TABLET BY MOUTH DAILY (*DUE FOR F/U WITH PCP*)  . aspirin EC 81 MG tablet Take 81 mg by mouth daily. Swallow whole.  . Clobetasol Prop Emollient Base 0.05 % emollient cream Apply 1 application topically daily as needed (Psoriasis).   . Clobetasol Propionate 0.05 % shampoo Apply 1 application topically daily as needed (Psoriasis).   . cyanocobalamin (,VITAMIN B-12,) 1000 MCG/ML injection Inject 1 mL (1,000 mcg total) into the muscle every 30 (thirty) days.  Marland Kitchen ibuprofen (ADVIL) 800 MG tablet Take 1 tablet (800 mg total) by mouth every 8 (eight) hours.  Marland Kitchen labetalol (NORMODYNE) 200 MG tablet Take 1 tablet (200 mg total) by mouth 2 (two) times daily.  Marland Kitchen levothyroxine (EUTHYROX) 50 MCG tablet Take 1 tablet (50 mcg total) by mouth daily before breakfast.  . metFORMIN (GLUCOPHAGE) 1000 MG tablet Take 1 tablet (1,000 mg total) by mouth 2 (two) times daily with a meal.  . omeprazole (PRILOSEC) 20 MG capsule Take 1 capsule (20 mg total) by mouth daily. (Patient taking differently: Take 20 mg by mouth at bedtime.)  . Semaglutide,0.25 or 0.5MG /DOS, (OZEMPIC, 0.25 OR 0.5 MG/DOSE,) 2 MG/1.5ML SOPN Inject 0.25 mg into the skin once a week. Inject 0.25 mg into the skin once a week for 30 days, then 0.5 mg once a week.  Marcy Panning 90 MG/ML SOSY injection Inject 90 mg into the skin See admin instructions. EVERY 12 WEEKS    Allergies  Allergen Reactions  . Bactrim [Sulfamethoxazole-Trimethoprim] Rash    Patient Active Problem List   Diagnosis Date Noted  . History of cesarean delivery 08/12/2020  . Hypothyroidism 08/30/2018  . Diabetes mellitus (HCC) 08/30/2018  . B12 deficiency 08/30/2018  . Vitamin D deficiency 08/30/2018  . Family history of premature CAD 08/30/2018  . History of antiphospholipid syndrome 08/30/2018  . PCOS (polycystic ovarian syndrome) 01/13/2018  . GERD (gastroesophageal reflux disease) 01/13/2018  .  Psoriasis 01/13/2018    Family History  Problem Relation Age of Onset  . Lung cancer Father   . Lung cancer Mother   . Heart attack Mother   . Hypertension Mother     Social History   Tobacco Use  Smoking Status Former Smoker  . Years: 10.00  . Types: Cigarettes  . Quit date: 10/03/2013  . Years since quitting: 7.3  Smokeless Tobacco Never Used    Past Surgical History:  Procedure Laterality Date  . CESAREAN SECTION N/A 06/23/2018   Procedure: CESAREAN SECTION;  Surgeon: Tereso Newcomer, MD;  Location: [facility];  Service: Obstetrics;  Laterality: N/A;  . CESAREAN SECTION WITH BILATERAL TUBAL LIGATION N/A 08/12/2020   Procedure: CESAREAN SECTION WITH BILATERAL TUBAL LIGATION;  Surgeon: Hermina Staggers, MD;  Location: [facility];  Service: Obstetrics;  Laterality: N/A;  . DILATION AND EVACUATION N/A 04/05/2014   Procedure: DILATATION AND EVACUATION with genetic studies;  Surgeon: Mitchel Honour, DO;  Location: [facility];  Service: Gynecology;  Laterality: N/A;  . IUD REMOVAL N/A 08/15/2019   Procedure: INTRAUTERINE DEVICE (IUD) REMOVAL;  Surgeon: Allie Bossier, MD;  Location: [facility];  Service: Gynecology;  Laterality: N/A;    Immunization History  Administered Date(s) Administered  . PPD Test 11/12/2016  . Tdap 04/26/2018, 06/03/2020    No results found for this or any previous visit (from the past 2160 hour(s)).  No results found.     All questions at time  of visit were answered - patient instructed to contact office with any additional concerns or updates. ER/RTC precautions were reviewed with the patient as applicable.   Please note: manual typing as well as voice recognition software may have been used to produce this document - typos may escape review. Please contact Dr. Sheppard Coil for any needed clarifications.

## 2021-01-22 NOTE — Patient Instructions (Addendum)
With sudden loss of swallowing function, first priority is to rule out stroke. I've ordered MRI brain to get done ASAP.   If MRI shows stroke, would need to add blood thinner / anti-platelet Plavix to your Aspirin, and would need to get you in with neurology ASAP to discuss next steps and. You'd need to go to the hospital immediately if new weakness of any kind, any confusion or speech distrurbance!  If MRI is normal, we need to look for other cause of swallowing difficulty - this would involve more specialized tests with gastroenterology, I will place referral.

## 2021-01-23 NOTE — Addendum Note (Signed)
Addended by: Deirdre Pippins on: 01/23/2021 07:30 AM   Modules accepted: Orders

## 2021-01-27 ENCOUNTER — Other Ambulatory Visit: Payer: Self-pay | Admitting: Osteopathic Medicine

## 2021-02-18 ENCOUNTER — Other Ambulatory Visit: Payer: Self-pay | Admitting: Osteopathic Medicine

## 2021-02-25 ENCOUNTER — Ambulatory Visit: Payer: 59 | Admitting: Gastroenterology

## 2021-03-25 ENCOUNTER — Other Ambulatory Visit: Payer: Self-pay | Admitting: Osteopathic Medicine

## 2021-03-25 ENCOUNTER — Other Ambulatory Visit: Payer: Self-pay

## 2021-03-25 MED ORDER — LEVOTHYROXINE SODIUM 50 MCG PO TABS: ORAL_TABLET | ORAL | 0 refills | Status: DC

## 2021-04-14 ENCOUNTER — Other Ambulatory Visit: Payer: Self-pay | Admitting: Osteopathic Medicine

## 2021-04-25 ENCOUNTER — Other Ambulatory Visit: Payer: Self-pay | Admitting: Osteopathic Medicine

## 2021-07-25 ENCOUNTER — Other Ambulatory Visit: Payer: Self-pay | Admitting: Obstetrics and Gynecology

## 2021-08-10 ENCOUNTER — Other Ambulatory Visit: Payer: Self-pay | Admitting: Osteopathic Medicine

## 2021-08-11 ENCOUNTER — Other Ambulatory Visit: Payer: Self-pay | Admitting: Osteopathic Medicine

## 2021-11-13 ENCOUNTER — Other Ambulatory Visit: Payer: Self-pay | Admitting: Family Medicine

## 2021-12-13 ENCOUNTER — Other Ambulatory Visit: Payer: Self-pay | Admitting: Family Medicine

## 2022-02-01 ENCOUNTER — Other Ambulatory Visit: Payer: Self-pay | Admitting: Osteopathic Medicine

## 2022-02-23 ENCOUNTER — Other Ambulatory Visit: Payer: Self-pay | Admitting: Osteopathic Medicine

## 2022-07-30 ENCOUNTER — Other Ambulatory Visit: Payer: Self-pay | Admitting: Family Medicine
